# Patient Record
Sex: Male | Born: 1935 | Race: White | Hispanic: No | Smoking: Never smoker
Health system: Southern US, Community
[De-identification: ages and names within clinical notes are randomized; demographics above are authoritative.]

## PROBLEM LIST (undated history)

## (undated) DIAGNOSIS — I495 Sick sinus syndrome: Secondary | ICD-10-CM

## (undated) DIAGNOSIS — Z8546 Personal history of malignant neoplasm of prostate: Secondary | ICD-10-CM

## (undated) DIAGNOSIS — I499 Cardiac arrhythmia, unspecified: Secondary | ICD-10-CM

## (undated) DIAGNOSIS — I509 Heart failure, unspecified: Secondary | ICD-10-CM

## (undated) DIAGNOSIS — I5022 Chronic systolic (congestive) heart failure: Secondary | ICD-10-CM

## (undated) DIAGNOSIS — I4821 Permanent atrial fibrillation: Secondary | ICD-10-CM

## (undated) DIAGNOSIS — I429 Cardiomyopathy, unspecified: Secondary | ICD-10-CM

## (undated) DIAGNOSIS — H269 Unspecified cataract: Secondary | ICD-10-CM

## (undated) DIAGNOSIS — M7660 Achilles tendinitis, unspecified leg: Secondary | ICD-10-CM

## (undated) DIAGNOSIS — B029 Zoster without complications: Secondary | ICD-10-CM

## (undated) DIAGNOSIS — I8390 Asymptomatic varicose veins of unspecified lower extremity: Secondary | ICD-10-CM

## (undated) DIAGNOSIS — H9190 Unspecified hearing loss, unspecified ear: Secondary | ICD-10-CM

## (undated) HISTORY — DX: Asymptomatic varicose veins of unspecified lower extremity: I83.90

## (undated) HISTORY — DX: Sick sinus syndrome: I49.5

## (undated) HISTORY — DX: Chronic systolic (congestive) heart failure: I50.22

## (undated) HISTORY — DX: Heart failure, unspecified: I50.9

## (undated) HISTORY — DX: Permanent atrial fibrillation: I48.21

## (undated) HISTORY — DX: Cardiomyopathy, unspecified: I42.9

## (undated) HISTORY — PX: BREAST SURGERY: SHX581

## (undated) HISTORY — PX: CATARACT EXTRACTION: SUR2

## (undated) HISTORY — DX: Personal history of malignant neoplasm of prostate: Z85.46

## (undated) HISTORY — DX: Zoster without complications: B02.9

## (undated) HISTORY — DX: Cardiac arrhythmia, unspecified: I49.9

## (undated) NOTE — *Deleted (*Deleted)
CH received page from RN to support pt. who has seemed somewhat 'sad' today; when [facility] arrived, pt. lying in bed w/

---

## 1958-12-26 HISTORY — PX: APPENDECTOMY: SHX54

## 2003-08-23 LAB — HM COLONOSCOPY

## 2008-02-22 ENCOUNTER — Encounter: Payer: Self-pay | Admitting: Family Medicine

## 2008-02-25 ENCOUNTER — Encounter: Payer: Self-pay | Admitting: Family Medicine

## 2008-03-26 ENCOUNTER — Encounter: Payer: Self-pay | Admitting: Family Medicine

## 2008-04-26 ENCOUNTER — Encounter: Payer: Self-pay | Admitting: Family Medicine

## 2008-05-27 ENCOUNTER — Encounter: Payer: Self-pay | Admitting: Family Medicine

## 2008-06-24 ENCOUNTER — Encounter: Payer: Self-pay | Admitting: Family Medicine

## 2008-06-24 DIAGNOSIS — D170 Benign lipomatous neoplasm of skin and subcutaneous tissue of head, face and neck: Secondary | ICD-10-CM | POA: Insufficient documentation

## 2008-07-18 ENCOUNTER — Ambulatory Visit: Payer: Self-pay | Admitting: Family Medicine

## 2008-07-21 DIAGNOSIS — B029 Zoster without complications: Secondary | ICD-10-CM

## 2008-07-21 HISTORY — DX: Zoster without complications: B02.9

## 2008-08-07 ENCOUNTER — Ambulatory Visit: Payer: Self-pay | Admitting: Family Medicine

## 2009-07-25 ENCOUNTER — Ambulatory Visit: Payer: Self-pay | Admitting: Ophthalmology

## 2009-07-28 ENCOUNTER — Ambulatory Visit: Payer: Self-pay | Admitting: Ophthalmology

## 2011-04-02 ENCOUNTER — Ambulatory Visit: Payer: Self-pay | Admitting: Family Medicine

## 2013-03-06 LAB — LIPID PANEL
Cholesterol: 231 mg/dL — AB (ref 0–200)
HDL: 78 mg/dL — AB (ref 35–70)
LDL CALC: 133 mg/dL
Triglycerides: 98 mg/dL (ref 40–160)

## 2013-03-06 LAB — CBC AND DIFFERENTIAL
HEMATOCRIT: 40 % — AB (ref 41–53)
Hemoglobin: 13.6 g/dL (ref 13.5–17.5)
PLATELETS: 323 10*3/uL (ref 150–399)
WBC: 6.7 10*3/mL

## 2013-04-27 LAB — TSH: TSH: 5.19 u[IU]/mL (ref 0.41–5.90)

## 2013-04-27 LAB — BASIC METABOLIC PANEL
BUN: 18 mg/dL (ref 4–21)
CREATININE: 0.9 mg/dL (ref 0.6–1.3)
GLUCOSE: 108 mg/dL
Potassium: 5.1 mmol/L (ref 3.4–5.3)
SODIUM: 139 mmol/L (ref 137–147)

## 2013-04-27 LAB — HEPATIC FUNCTION PANEL
ALT: 15 U/L (ref 10–40)
AST: 18 U/L (ref 14–40)

## 2014-08-27 DIAGNOSIS — I8393 Asymptomatic varicose veins of bilateral lower extremities: Secondary | ICD-10-CM | POA: Diagnosis not present

## 2014-09-03 DIAGNOSIS — Z961 Presence of intraocular lens: Secondary | ICD-10-CM | POA: Diagnosis not present

## 2014-10-08 DIAGNOSIS — D649 Anemia, unspecified: Secondary | ICD-10-CM | POA: Insufficient documentation

## 2014-10-08 DIAGNOSIS — N419 Inflammatory disease of prostate, unspecified: Secondary | ICD-10-CM | POA: Insufficient documentation

## 2014-10-08 DIAGNOSIS — I493 Ventricular premature depolarization: Secondary | ICD-10-CM | POA: Insufficient documentation

## 2014-10-08 DIAGNOSIS — I8393 Asymptomatic varicose veins of bilateral lower extremities: Secondary | ICD-10-CM | POA: Insufficient documentation

## 2014-10-08 DIAGNOSIS — R32 Unspecified urinary incontinence: Secondary | ICD-10-CM | POA: Insufficient documentation

## 2014-10-08 DIAGNOSIS — Z8546 Personal history of malignant neoplasm of prostate: Secondary | ICD-10-CM

## 2014-10-08 DIAGNOSIS — I872 Venous insufficiency (chronic) (peripheral): Secondary | ICD-10-CM | POA: Insufficient documentation

## 2014-10-08 HISTORY — DX: Personal history of malignant neoplasm of prostate: Z85.46

## 2014-10-09 ENCOUNTER — Encounter: Payer: Self-pay | Admitting: Family Medicine

## 2014-10-09 ENCOUNTER — Ambulatory Visit (INDEPENDENT_AMBULATORY_CARE_PROVIDER_SITE_OTHER): Payer: Medicare Other | Admitting: Family Medicine

## 2014-10-09 VITALS — BP 92/54 | HR 57 | Temp 97.9°F | Resp 16 | Ht 69.5 in | Wt 200.0 lb

## 2014-10-09 DIAGNOSIS — D649 Anemia, unspecified: Secondary | ICD-10-CM | POA: Diagnosis not present

## 2014-10-09 DIAGNOSIS — R609 Edema, unspecified: Secondary | ICD-10-CM | POA: Insufficient documentation

## 2014-10-09 DIAGNOSIS — Z0000 Encounter for general adult medical examination without abnormal findings: Secondary | ICD-10-CM

## 2014-10-09 NOTE — Progress Notes (Signed)
Patient: Douglas Calhoun, Male    DOB: 08-29-1919, 81 y.o.   MRN: [id number] Visit Date: 10/09/2014  Today's Provider: Lelon Huh, MD   Chief Complaint  Patient presents with  . Medicare Wellness  . Anemia   Subjective:    Annual wellness visit Douglas Calhoun is a 81 y.o. male who presents today for his Subsequent Annual Wellness Visit. He feels well. He reports exercising . He reports he is sleeping poorly.  ----------------------------------------------------------- Patient follow-up for PVC, last seen 04/27/2013. Checked labs/Normal. No changes made. Follow-up for Anemia, last seen 03/06/2013. Checked labs. No changes made. Last EKG:04/27/2013, Last colonoscopy 08/23/2003: Internal hemorrhoids, non-bleeding colonic angiectasias.  Review of Systems  Constitutional: Negative for chills, appetite change and fatigue.  HENT: Positive for hearing loss. Negative for ear pain and sinus pressure.   Eyes: Negative for pain and visual disturbance.  Respiratory: Negative for cough, chest tightness, shortness of breath and wheezing.   Cardiovascular: Negative for chest pain, palpitations and leg swelling.  Gastrointestinal: Negative for nausea, vomiting, abdominal pain, diarrhea, constipation and blood in stool.  Genitourinary: Negative for urgency, frequency, flank pain, decreased urine volume and penile pain.  Musculoskeletal: Positive for neck stiffness. Negative for back pain, joint swelling and neck pain.  Neurological: Negative for dizziness, light-headedness and headaches.  Psychiatric/Behavioral: Positive for sleep disturbance. Negative for confusion and dysphoric mood. The patient is not nervous/anxious.        Some difficulty going to sleep    History   Social History  . Marital Status: Married    Spouse Name: N/A  . Number of Children: N/A  . Years of Education: N/A   Occupational History  . retired    Social History Main Topics  . Smoking status: Never Smoker   . Smokeless  tobacco: Not on file  . Alcohol Use: 0.0 oz/week    0 Standard drinks or equivalent per week     Comment: drinks 2 beers a month  . Drug Use: No  . Sexual Activity: Not on file   Other Topics Concern  . Not on file   Social History Narrative    Patient Active Problem List   Diagnosis Date Noted  . Anemia 10/08/2014  . Personal history of prostate cancer 10/08/2014  . Prostatitis 10/08/2014  . PVC (premature ventricular contraction) 10/08/2014  . Urinary incontinence 10/08/2014  . Varicose veins of legs 10/08/2014  . Venous insufficiency 10/08/2014  . Herpes zoster without complication 74/25/9563  . Lipoma of skin, face 06/24/2008    Past Surgical History  Procedure Laterality Date  . Breast surgery      Breast Biopsy prior to 1960  . Cataract extraction Left   . Appendectomy  1960's    His family history includes Heart disease in his mother; Stroke in his mother.    Previous Medications   ASPIRIN 81 MG TABLET    Take 1 tablet by mouth daily.   CALCIUM 500 MG TABLET    Take 1 tablet by mouth daily.   FERROUS SULFATE (IRON SUPPLEMENT) 325 (65 FE) MG TABLET    Take 1 tablet by mouth daily.   MELOXICAM (MOBIC) 7.5 MG TABLET    Take 1 tablet by mouth daily.   MULTIPLE VITAMINS-MINERALS (MULTIVITAMIN ADULT PO)    Take 1 tablet by mouth daily.   VITAMIN D, CHOLECALCIFEROL, 400 UNITS TABS    Take 1 tablet by mouth daily.   VITAMIN E 400 UNITS TABS    Take 1 tablet by  mouth daily.    Patient Care Team: Birdie Sons, MD as PCP - General (Family Medicine)     Objective:   Vitals: BP 92/54 mmHg  Pulse 57  Temp(Src) 97.9 F (36.6 C) (Oral)  Resp 16  Ht 5' 9.5" (1.765 m)  Wt 200 lb (90.719 kg)  BMI 29.12 kg/m2  SpO2 95%  Physical Exam   General Appearance:    Alert, cooperative, no distress, appears younger than stated age  Head:    Normocephalic, without obvious abnormality, atraumatic  Eyes:    PERRL, conjunctiva/corneas clear, EOM's intact, fundi    benign,  both eyes       Ears:    Normal TM's and external ear canals, both ears, significant hearing impairment  Nose:   Nares normal, septum midline, mucosa normal, no drainage   or sinus tenderness  Throat:   Lips, mucosa, and tongue normal; teeth and gums normal  Neck:   Supple, symmetrical, trachea midline, no adenopathy;       thyroid:  No enlargement/tenderness/nodules; no carotid   bruit or JVD  Back:     Symmetric, no curvature, ROM normal, no CVA tenderness  Lungs:     Clear to auscultation bilaterally, respirations unlabored  Chest wall:    No tenderness or deformity  Heart:    Regular rate and rhythm, S1 and S2 normal, III/VI systolic murmur, rub   or gallop  Abdomen:     Soft, non-tender, bowel sounds active all four quadrants,    no masses, no organomegaly  Genitalia:    deferred  Rectal:    deferred  Extremities:   Extremities normal, atraumatic, no cyanosis or edema  Pulses:   2+ and symmetric all extremities  Skin:   Skin color, texture, turgor normal, no rashes or lesions  Lymph nodes:   Cervical, supraclavicular, and axillary nodes normal  Neurologic:   CNII-XII intact. Normal strength, sensation and reflexes      throughout    Fall Risk Assessment Fall Risk  10/09/2014  Falls in the past year? Yes  Number falls in past yr: 1  Injury with Fall? Yes     Depression Screen PHQ 2/9 Scores 10/09/2014  PHQ - 2 Score 0    Cognitive Testing - 6-CIT  Correct? Score   What year is it? yes 0 0 or 4  What month is it? yes 0 0 or 3  Memorize:    Pia Mau,  44,  [address]  [place],      What time is it? (within 1 hour) yes 0 0 or 3  Count backwards from 20 yes 0 0, 2, or 4  Name the months of the year yes 0 0, 2, or 4  Repeat name & address above yes 0 0, 2, 4, 6, 8, or 10       TOTAL SCORE  0/28   Interpretation:  Normal  Normal (0-7) Abnormal (8-28)       Assessment & Plan:     Annual Wellness Visit  Reviewed patient's Family Medical History Reviewed and  updated list of patient's medical providers Assessment of cognitive impairment was done Assessed patient's functional ability Established a written schedule for health screening [place] Completed and Reviewed  Exercise Activities and Dietary recommendations Goals    None      Immunization History  Administered Date(s) Administered  . Pneumococcal Conjugate-13 03/06/2013  . Tdap 02/29/2012  . Zoster 03/26/2010    Health Maintenance  Topic Date Due  . INFLUENZA VACCINE  11/25/2014  . TETANUS/TDAP  02/28/2022  . ZOSTAVAX  Completed      Discussed health benefits of physical activity, and encouraged him to engage in regular exercise appropriate for his age and condition.    ------------------------------------------------------------------------------------------------------------

## 2014-10-10 ENCOUNTER — Encounter: Payer: Self-pay | Admitting: Family Medicine

## 2014-10-10 LAB — RENAL FUNCTION PANEL
Albumin: 4.2 g/dL (ref 3.2–4.6)
BUN / CREAT RATIO: 18 (ref 10–22)
BUN: 17 mg/dL (ref 10–36)
CO2: 22 mmol/L (ref 18–29)
Calcium: 9.4 mg/dL (ref 8.6–10.2)
Chloride: 100 mmol/L (ref 97–108)
Creatinine, Ser: 0.93 mg/dL (ref 0.76–1.27)
GFR calc Af Amer: 82 mL/min/{1.73_m2} (ref >59)
GFR calc non Af Amer: 71 mL/min/{1.73_m2} (ref >59)
Glucose: 104 mg/dL — ABNORMAL HIGH (ref 65–99)
Phosphorus: 3.7 mg/dL (ref 2.5–4.5)
Potassium: 4.9 mmol/L (ref 3.5–5.2)
Sodium: 136 mmol/L (ref 134–144)

## 2014-10-10 LAB — CBC
Hematocrit: 37.1 % — ABNORMAL LOW (ref 37.5–51.0)
Hemoglobin: 12.7 g/dL (ref 12.6–17.7)
MCH: 33.5 pg — AB (ref 26.6–33.0)
MCHC: 34.2 g/dL (ref 31.5–35.7)
MCV: 98 fL — ABNORMAL HIGH (ref 79–97)
Platelets: 279 10*3/uL (ref 150–379)
RBC: 3.79 x10E6/uL — AB (ref 4.14–5.80)
RDW: 13.8 % (ref 12.3–15.4)
WBC: 7.4 10*3/uL (ref 3.4–10.8)

## 2014-10-10 LAB — T4 AND TSH
T4 TOTAL: 5.5 ug/dL (ref 4.5–12.0)
TSH: 2.86 u[IU]/mL (ref 0.450–4.500)

## 2014-11-19 ENCOUNTER — Emergency Department (HOSPITAL_COMMUNITY): Payer: Medicare Other

## 2014-11-19 ENCOUNTER — Emergency Department (HOSPITAL_COMMUNITY)
Admission: EM | Admit: 2014-11-19 | Discharge: 2014-11-19 | Disposition: A | Discharge status: Home / Self Care | Payer: Medicare Other | Attending: Emergency Medicine | Admitting: Emergency Medicine

## 2014-11-19 ENCOUNTER — Encounter (HOSPITAL_COMMUNITY): Payer: Self-pay

## 2014-11-19 DIAGNOSIS — Z8619 Personal history of other infectious and parasitic diseases: Secondary | ICD-10-CM | POA: Insufficient documentation

## 2014-11-19 DIAGNOSIS — Z8546 Personal history of malignant neoplasm of prostate: Secondary | ICD-10-CM | POA: Insufficient documentation

## 2014-11-19 DIAGNOSIS — Z7982 Long term (current) use of aspirin: Secondary | ICD-10-CM | POA: Diagnosis not present

## 2014-11-19 DIAGNOSIS — H9190 Unspecified hearing loss, unspecified ear: Secondary | ICD-10-CM | POA: Insufficient documentation

## 2014-11-19 DIAGNOSIS — Z79899 Other long term (current) drug therapy: Secondary | ICD-10-CM | POA: Insufficient documentation

## 2014-11-19 DIAGNOSIS — Z8739 Personal history of other diseases of the musculoskeletal system and connective tissue: Secondary | ICD-10-CM | POA: Insufficient documentation

## 2014-11-19 DIAGNOSIS — R0789 Other chest pain: Secondary | ICD-10-CM | POA: Diagnosis not present

## 2014-11-19 DIAGNOSIS — R079 Chest pain, unspecified: Secondary | ICD-10-CM | POA: Diagnosis present

## 2014-11-19 HISTORY — DX: Achilles tendinitis, unspecified leg: M76.60

## 2014-11-19 HISTORY — DX: Unspecified cataract: H26.9

## 2014-11-19 HISTORY — DX: Unspecified hearing loss, unspecified ear: H91.90

## 2014-11-19 LAB — CBC
HEMATOCRIT: 37.2 % — AB (ref 39.0–52.0)
Hemoglobin: 12.6 g/dL — ABNORMAL LOW (ref 13.0–17.0)
MCH: 34.1 pg — AB (ref 26.0–34.0)
MCHC: 33.9 g/dL (ref 30.0–36.0)
MCV: 100.5 fL — AB (ref 78.0–100.0)
Platelets: 223 10*3/uL (ref 150–400)
RBC: 3.7 MIL/uL — ABNORMAL LOW (ref 4.22–5.81)
RDW: 13 % (ref 11.5–15.5)
WBC: 7.3 10*3/uL (ref 4.0–10.5)

## 2014-11-19 LAB — I-STAT TROPONIN, ED: Troponin i, poc: 0 ng/mL (ref 0.00–0.08)

## 2014-11-19 LAB — COMPREHENSIVE METABOLIC PANEL
ALT: 17 U/L (ref 17–63)
AST: 30 U/L (ref 15–41)
Albumin: 3.5 g/dL (ref 3.5–5.0)
Alkaline Phosphatase: 52 U/L (ref 38–126)
Anion gap: 6 (ref 5–15)
BUN: 15 mg/dL (ref 6–20)
CO2: 24 mmol/L (ref 22–32)
Calcium: 8.9 mg/dL (ref 8.9–10.3)
Chloride: 107 mmol/L (ref 101–111)
Creatinine, Ser: 0.81 mg/dL (ref 0.61–1.24)
GFR calc Af Amer: >60 mL/min (ref >60)
GLUCOSE: 112 mg/dL — AB (ref 65–99)
POTASSIUM: 4.1 mmol/L (ref 3.5–5.1)
Sodium: 137 mmol/L (ref 135–145)
Total Bilirubin: 0.7 mg/dL (ref 0.3–1.2)
Total Protein: 6 g/dL — ABNORMAL LOW (ref 6.5–8.1)

## 2014-11-19 LAB — TROPONIN I: Troponin I: <0.03 ng/mL (ref <0.031)

## 2014-11-19 NOTE — ED Provider Notes (Signed)
CSN: [id number]     Arrival date & time 11/19/14  43 History   First MD Initiated Contact with Patient 11/19/14 1557     Chief Complaint  Patient presents with  . Chest Pain    HPI  Patient presents with concern of chest pain. Patient was at a regular checkup, when he developed sternal chest pressure. There is no radiation, no associated symptoms, including no dyspnea, lightheadedness, syncope, nausea, vomiting. Patient was well prior to the onset of symptoms. Symptoms resolved with nitroglycerin, aspirin. Patient also had Nitropaste applied by EMS in route. Here patient has no complaint, states that he is in his usual state of health.   Past Medical History  Diagnosis Date  . Herpes zoster without complication 08/12/6220  . Personal history of prostate cancer 10/08/2014    s/p 15 radiation treatments treated by Dr. Madelin Headings   . Hearing loss   . Cataract   . Achilles tendinitis    Past Surgical History  Procedure Laterality Date  . Breast surgery      Breast Biopsy prior to 1960  . Cataract extraction Left   . Appendectomy  1960's   Family History  Problem Relation Age of Onset  . Heart disease Mother   . Stroke Mother    History  Substance Use Topics  . Smoking status: Never Smoker   . Smokeless tobacco: Not on file  . Alcohol Use: 0.0 oz/week    0 Standard drinks or equivalent per week     Comment: drinks 2 beers a month    Review of Systems  Constitutional:       Per HPI, otherwise negative  HENT:       Per HPI, otherwise negative  Respiratory:       Per HPI, otherwise negative  Cardiovascular:       Per HPI, otherwise negative  Gastrointestinal: Negative for vomiting.  Endocrine:       Negative aside from HPI  Genitourinary:       Neg aside from HPI   Musculoskeletal:       Per HPI, otherwise negative  Skin: Negative.   Neurological: Negative for syncope.      Allergies  Review of patient's allergies indicates no known allergies.  Home  Medications   Prior to Admission medications   Medication Sig Start Date End Date Taking? Authorizing Provider  alendronate (FOSAMAX) 70 MG tablet Take 70 mg by mouth once a week. Take with a full glass of water on an empty stomach on Saturdays.   Yes Historical Provider, MD  Ascorbic Acid (VITAMIN C PO) Take 1 tablet by mouth daily.   Yes Historical Provider, MD  aspirin 81 MG tablet Take 1 tablet by mouth daily.   Yes Historical Provider, MD  Calcium 500 MG tablet Take 1 tablet by mouth daily. 07/17/08  Yes Historical Provider, MD  docusate sodium (COLACE) 100 MG capsule Take 100 mg by mouth daily.   Yes Historical Provider, MD  ferrous sulfate (IRON SUPPLEMENT) 325 (65 FE) MG tablet Take 1 tablet by mouth daily.   Yes Historical Provider, MD  Multiple Vitamins-Minerals (MULTIVITAMIN ADULT PO) Take 1 tablet by mouth daily. 07/17/08  Yes Historical Provider, MD  psyllium (METAMUCIL SMOOTH TEXTURE) 28 % packet Take 1 packet by mouth daily.   Yes Historical Provider, MD  tamsulosin (FLOMAX) 0.4 MG CAPS capsule Take 0.4 mg by mouth daily after supper.   Yes Historical Provider, MD  Vitamin D, Cholecalciferol, 400 UNITS TABS Take 1  tablet by mouth daily. 07/17/08  Yes Historical Provider, MD  Vitamin E 400 UNITS TABS Take 1 tablet by mouth daily. 07/17/08  Yes Historical Provider, MD   BP 117/53 mmHg  Pulse 59  Temp(Src) 98.9 F (37.2 C) (Oral)  Resp 18  SpO2 98% Physical Exam  Constitutional: He is oriented to person, place, and time. He appears well-developed. No distress.  HENT:  Head: Normocephalic and atraumatic.  Eyes: Conjunctivae and EOM are normal.  Cardiovascular: Normal rate and regular rhythm.   Pulmonary/Chest: Effort normal. No stridor. No respiratory distress.  L upper chest nitro paste in place  Abdominal: He exhibits no distension. There is no tenderness.  Musculoskeletal: He exhibits no edema.  Neurological: He is alert and oriented to person, place, and time.  Skin: Skin  is warm and dry.  Psychiatric: He has a normal mood and affect.  Nursing note and vitals reviewed.   ED Course  Procedures (including critical care time) Labs Review Labs Reviewed  CBC  COMPREHENSIVE METABOLIC PANEL  TROPONIN I    Imaging Review No results found.   EKG Interpretation   Date/Time:  Tuesday November 19 2014 15:58:34 EDT Ventricular Rate:  60 PR Interval:  147 QRS Duration: 109 QT Interval:  448 QTC Calculation: 448 R Axis:   -13 Text Interpretation:  Sinus rhythm Ventricular trigeminy Low voltage,  extremity and precordial leads Sinus rhythm Sinus rhythm Premature  ventricular complexes Artifact Abnormal ekg Confirmed by Carmin Muskrat   MD ([id number]) on 11/19/2014 4:21:05 PM     Patient's BP is 95/45. Nitro paste removed.   O2- 99%ra  cardiac: 65sr, nml   After the initial troponin had normal value, I reassessed the patient. He had no ongoing complaints. Vital signs are stable.  After second one also resulted normal, reevaluation demonstrated similar reassuring physical exam findings, vital sign instability, no complaint. Patient has a physician about the scheduled for tomorrow, will proceed to that appointment. MDM   Final diagnoses:  Atypical chest pain  elderly male with no history of coronary disease presents after an episode of sternal chest pain. In this facility the patient had no pain, no evidence for arrhythmia, labs were unremarkable, with 2 negative troponins, nonischemic EKG: There is low suspicion for ongoing coronary ischemia. Symptoms maybe secondary to gastroesophageal etiology versus vasospasm versus occult infection, though this seems less likely. No evidence for respiratory dysfunction, including no cough, dyspnea, hypoxia, tachypnea. We discussed patient's x-ray findings, and he agreed, no evidence for pneumonia. Patient will follow-up with his physician tomorrow for further evaluation, management.  Carmin Muskrat, MD 11/19/14  [phone]

## 2014-11-19 NOTE — ED Notes (Signed)
Pt. Presents to ED from [facility] center. Pt. Receiving hearing aid checkup when he had sudden onset of substernal CP, denies radiation, denies associated symptoms. No EKG changes noted by EMS. [facility] administered 1 Nitro and 324 ASA which brought pain from 5/10 to 2/10. EMS administered additional 3 Nitro and 1 inch Nitro paste. Pt. Denies pain at present.

## 2014-11-19 NOTE — Discharge Instructions (Signed)
As discussed, it is important that you follow up as soon as possible with your physician for continued management of your condition. ° °If you develop any new, or concerning changes in your condition, please return to the emergency department immediately. ° °

## 2014-11-19 NOTE — ED Notes (Signed)
This RN spoke with lab, who states that the pt's troponin will be done in approx 10-15 minutes.

## 2015-01-27 ENCOUNTER — Other Ambulatory Visit: Payer: Self-pay | Admitting: Family Medicine

## 2015-01-27 DIAGNOSIS — R079 Chest pain, unspecified: Secondary | ICD-10-CM

## 2015-01-30 ENCOUNTER — Ambulatory Visit (INDEPENDENT_AMBULATORY_CARE_PROVIDER_SITE_OTHER): Payer: Medicare Other

## 2015-01-30 ENCOUNTER — Other Ambulatory Visit: Payer: Self-pay

## 2015-01-30 DIAGNOSIS — R079 Chest pain, unspecified: Secondary | ICD-10-CM | POA: Diagnosis not present

## 2015-04-07 ENCOUNTER — Ambulatory Visit (INDEPENDENT_AMBULATORY_CARE_PROVIDER_SITE_OTHER): Payer: Medicare Other | Admitting: Family Medicine

## 2015-04-07 ENCOUNTER — Ambulatory Visit
Admission: RE | Admit: 2015-04-07 | Discharge: 2015-04-07 | Disposition: A | Discharge status: Home / Self Care | Payer: Medicare Other | Source: Ambulatory Visit | Attending: Family Medicine | Admitting: Family Medicine

## 2015-04-07 ENCOUNTER — Encounter: Payer: Self-pay | Admitting: Family Medicine

## 2015-04-07 VITALS — BP 120/52 | HR 66 | Temp 98.4°F | Resp 16 | Wt 212.0 lb

## 2015-04-07 DIAGNOSIS — J40 Bronchitis, not specified as acute or chronic: Secondary | ICD-10-CM

## 2015-04-07 DIAGNOSIS — J9811 Atelectasis: Secondary | ICD-10-CM | POA: Insufficient documentation

## 2015-04-07 MED ORDER — CEFDINIR 300 MG PO CAPS: 300.0000 mg | ORAL_CAPSULE | Freq: Two times a day (BID) | ORAL | Status: DC

## 2015-04-07 MED ORDER — HYDROCODONE-HOMATROPINE 5-1.5 MG/5ML PO SYRP: ORAL_SOLUTION | ORAL | Status: DC

## 2015-04-07 NOTE — Progress Notes (Signed)
Subjective:     Patient ID: Douglas Calhoun, male   DOB: 05-24-1919, 81 y.o.   MRN: [id number]  HPI  Chief Complaint  Patient presents with  . Cough    Patient comes in office today with concerns of cough and congestion for the past 3-4 days. Patient states that his cough is productive of phelgm and he has had slight blood come up when he coughs. Patient complains of shortness of breath but denies any chest pain/pressure, patient has not taken any otc medication.   Reports low grade fever at home. Deep non-productive cough during his office visit.   Review of Systems     Objective:   Physical Exam  Constitutional: He appears well-developed and well-nourished. No distress.  Ears: T.M's intact without inflammation Throat: no tonsillar enlargement or exudate Neck: no cervical adenopathy Lungs: clear     Assessment:    1. Bronchitis; will evaluate for pneumonia due to low saturation. - cefdinir (OMNICEF) 300 MG capsule; Take 1 capsule (300 mg total) by mouth 2 (two) times daily.  Dispense: 20 capsule; Refill: 0 - DG Chest 2 View; Future - HYDROcodone-homatropine (HYCODAN) 5-1.5 MG/5ML syrup; 5 ml 6-8 hours as needed for cough  Dispense: 240 mL; Refill: 0    Plan:    Further f/u pending x-ray report.

## 2015-04-07 NOTE — Patient Instructions (Signed)
Discussed use of Mucinex and Delsym 

## 2015-04-08 ENCOUNTER — Telehealth: Payer: Self-pay

## 2015-04-08 NOTE — Telephone Encounter (Signed)
-----   Message from Carmon Ginsberg, [place] sent at 04/07/2015  5:11 PM EST ----- No pneumonia

## 2015-04-08 NOTE — Telephone Encounter (Signed)
Patient was advised of lab report. KW 

## 2015-04-15 ENCOUNTER — Other Ambulatory Visit: Payer: Self-pay | Admitting: Family Medicine

## 2015-04-15 NOTE — Telephone Encounter (Signed)
Please advise. Thanks.  

## 2015-04-15 NOTE — Telephone Encounter (Signed)
Pt states the cough medication sure has helped  him to rest at night, Pt is still coughing, pt would like to know if he could get a refill on the  HYDROcodone-homatropine (HYCODAN) 5-1.5 MG/5ML syrup.Pt use's  [facility] on [address].  CB# [phone] Thanks CC

## 2015-04-16 ENCOUNTER — Other Ambulatory Visit: Payer: Self-pay | Admitting: Family Medicine

## 2015-04-16 DIAGNOSIS — J40 Bronchitis, not specified as acute or chronic: Secondary | ICD-10-CM

## 2015-04-16 MED ORDER — HYDROCODONE-HOMATROPINE 5-1.5 MG/5ML PO SYRP: ORAL_SOLUTION | ORAL | Status: DC

## 2015-04-16 NOTE — Telephone Encounter (Signed)
[id number] 

## 2015-04-16 NOTE — Telephone Encounter (Signed)
Prescription is available for pickup. If cough not improving would want him to f/u with his primary doctor.

## 2015-04-17 NOTE — Telephone Encounter (Signed)
Patient has been advised. KW 

## 2015-10-24 ENCOUNTER — Emergency Department
Admission: EM | Admit: 2015-10-24 | Discharge: 2015-10-24 | Disposition: A | Discharge status: Home / Self Care | Payer: Medicare Other | Attending: Emergency Medicine | Admitting: Emergency Medicine

## 2015-10-24 ENCOUNTER — Encounter: Payer: Self-pay | Admitting: Emergency Medicine

## 2015-10-24 DIAGNOSIS — Z7982 Long term (current) use of aspirin: Secondary | ICD-10-CM | POA: Insufficient documentation

## 2015-10-24 DIAGNOSIS — Z79899 Other long term (current) drug therapy: Secondary | ICD-10-CM | POA: Diagnosis not present

## 2015-10-24 DIAGNOSIS — R338 Other retention of urine: Secondary | ICD-10-CM

## 2015-10-24 DIAGNOSIS — R339 Retention of urine, unspecified: Secondary | ICD-10-CM | POA: Diagnosis present

## 2015-10-24 LAB — CBC
HCT: 36.7 % — ABNORMAL LOW (ref 40.0–52.0)
HEMOGLOBIN: 12.9 g/dL — AB (ref 13.0–18.0)
MCH: 34.2 pg — AB (ref 26.0–34.0)
MCHC: 35.2 g/dL (ref 32.0–36.0)
MCV: 97.3 fL (ref 80.0–100.0)
Platelets: 240 10*3/uL (ref 150–440)
RBC: 3.77 MIL/uL — ABNORMAL LOW (ref 4.40–5.90)
RDW: 12.7 % (ref 11.5–14.5)
WBC: 9.9 10*3/uL (ref 3.8–10.6)

## 2015-10-24 LAB — BASIC METABOLIC PANEL
Anion gap: 7 (ref 5–15)
BUN: 17 mg/dL (ref 6–20)
CHLORIDE: 102 mmol/L (ref 101–111)
CO2: 25 mmol/L (ref 22–32)
Calcium: 8.9 mg/dL (ref 8.9–10.3)
Creatinine, Ser: 0.73 mg/dL (ref 0.61–1.24)
GFR calc Af Amer: >60 mL/min (ref >60)
GLUCOSE: 121 mg/dL — AB (ref 65–99)
POTASSIUM: 4.1 mmol/L (ref 3.5–5.1)
Sodium: 134 mmol/L — ABNORMAL LOW (ref 135–145)

## 2015-10-24 LAB — URINALYSIS COMPLETE WITH MICROSCOPIC (ARMC ONLY)
BILIRUBIN URINE: NEGATIVE
Bacteria, UA: NONE SEEN
GLUCOSE, UA: NEGATIVE mg/dL
Hgb urine dipstick: NEGATIVE
Ketones, ur: NEGATIVE mg/dL
Leukocytes, UA: NEGATIVE
Nitrite: NEGATIVE
Protein, ur: NEGATIVE mg/dL
SPECIFIC GRAVITY, URINE: 1.008 (ref 1.005–1.030)
pH: 7 (ref 5.0–8.0)

## 2015-10-24 MED ORDER — LIDOCAINE HCL 2 % EX GEL
1.0000 "application " | Freq: Once | CUTANEOUS | Status: AC | PRN
  Administered 2015-10-24: 1 via URETHRAL
  Filled 2015-10-24: qty 5

## 2015-10-24 NOTE — ED Notes (Addendum)
Bladder Scan >59ml x3

## 2015-10-24 NOTE — ED Notes (Signed)
Reports unable to void since last pm.

## 2015-10-24 NOTE — ED Notes (Signed)
Education on catheter care given. Patient aware of the need for urgent follow up care.

## 2015-10-24 NOTE — Discharge Instructions (Signed)
Acute Urinary Retention, Male °Acute urinary retention is the temporary inability to urinate. °This is a common problem in older men. As men age their prostates become larger and block the flow of urine from the bladder. This is usually a problem that has come on gradually.  °HOME CARE INSTRUCTIONS °If you are sent home with a Foley catheter and a drainage system, you will need to discuss the best course of action with your health care provider. While the catheter is in, maintain a good intake of fluids. Keep the drainage bag emptied and lower than your catheter. This is so that contaminated urine will not flow back into your bladder, which could lead to a urinary tract infection. °There are two main types of drainage bags. One is a large bag that usually is used at night. It has a good capacity that will allow you to sleep through the night without having to empty it. The second type is called a leg bag. It has a smaller capacity, so it needs to be emptied more frequently. However, the main advantage is that it can be attached by a leg strap and can go underneath your clothing, allowing you the freedom to move about or leave your home. °Only take over-the-counter or prescription medicines for pain, discomfort, or fever as directed by your health care provider.  °SEEK MEDICAL CARE IF: °· You develop a low-grade fever. °· You experience spasms or leakage of urine with the spasms. °SEEK IMMEDIATE MEDICAL CARE IF:  °· You develop chills or fever. °· Your catheter stops draining urine. °· Your catheter falls out. °· You start to develop increased bleeding that does not respond to rest and increased fluid intake. °MAKE SURE YOU: °· Understand these instructions. °· Will watch your condition. °· Will get help right away if you are not doing well or get worse. °  °This information is not intended to replace advice given to you by your health care provider. Make sure you discuss any questions you have with your health care  provider. °  °Document Released: 07/19/2000 Document Revised: 08/27/2014 Document Reviewed: 09/21/2012 °Elsevier Interactive Patient Education ©2016 [employer] ° °

## 2015-10-24 NOTE — ED Provider Notes (Signed)
[facility] Emergency Department Provider Note  ____________________________________________    I have reviewed the triage vital signs and the nursing notes.   HISTORY  Chief Complaint Urinary Retention    HPI Douglas Calhoun is a 82 y.o. male who presents with urinary retention. Patient reports he is not urinated all night. He reports he focuses on taking lots of water during the day and usually has to urinate several times at night but he did not do so at all last night. He is unable to urinate, he complains of mild discomfort in his lower abdomen. No fevers or chills. No history of urinary tract infection. He reports he has a weak stream at baseline. He did have radiation to his prostate 20 years ago     Past Medical History  Diagnosis Date  . Herpes zoster without complication [id number]  . Personal history of prostate cancer 10/08/2014    s/p 15 radiation treatments treated by Dr. Madelin Headings   . Hearing loss   . Cataract   . Achilles tendinitis     Patient Active Problem List   Diagnosis Date Noted  . Edema 10/09/2014  . Anemia 10/08/2014  . PVC (premature ventricular contraction) 10/08/2014  . Urinary incontinence 10/08/2014  . Varicose veins of legs 10/08/2014  . Venous insufficiency 10/08/2014  . Lipoma of skin, face 06/24/2008    Past Surgical History  Procedure Laterality Date  . Breast surgery      Breast Biopsy prior to 1960  . Cataract extraction Left   . Appendectomy  1960's    Current Outpatient Rx  Name  Route  Sig  Dispense  Refill  . alendronate (FOSAMAX) 70 MG tablet   Oral   Take 70 mg by mouth once a week. Take with a full glass of water on an empty stomach on Saturdays.         Marland Kitchen apixaban (ELIQUIS) 5 MG TABS tablet   Oral   Take 5 mg by mouth 2 (two) times daily.         . Ascorbic Acid (VITAMIN C PO)   Oral   Take 1 tablet by mouth daily.         Marland Kitchen aspirin 81 MG tablet   Oral   Take 1 tablet by mouth  daily.         Marland Kitchen atorvastatin (LIPITOR) 20 MG tablet   Oral   Take 20 mg by mouth daily. 1/2 a pill at bedtime         . Calcium 500 MG tablet   Oral   Take 1 tablet by mouth daily.         . cefdinir (OMNICEF) 300 MG capsule   Oral   Take 1 capsule (300 mg total) by mouth 2 (two) times daily.   20 capsule   0   . docusate sodium (COLACE) 100 MG capsule   Oral   Take 100 mg by mouth daily.         . ferrous sulfate (IRON SUPPLEMENT) 325 (65 FE) MG tablet   Oral   Take 1 tablet by mouth daily.         Marland Kitchen HYDROcodone-homatropine (HYCODAN) 5-1.5 MG/5ML syrup      5 ml 6-8 hours as needed for cough   240 mL   0   . MELATONIN PO   Oral   Take by mouth at bedtime as needed.         . Multiple Vitamins-Minerals (MULTIVITAMIN  ADULT PO)   Oral   Take 1 tablet by mouth daily.         . psyllium (METAMUCIL SMOOTH TEXTURE) 28 % packet   Oral   Take 1 packet by mouth daily.         . tamsulosin (FLOMAX) 0.4 MG CAPS capsule   Oral   Take 0.4 mg by mouth daily after supper.         . Vitamin D, Cholecalciferol, 400 UNITS TABS   Oral   Take 1 tablet by mouth daily.         . Vitamin E 400 UNITS TABS   Oral   Take 1 tablet by mouth daily.           Allergies Review of patient's allergies indicates no known allergies.  Family History  Problem Relation Age of Onset  . Heart disease Mother   . Stroke Mother     Social History Social History  Substance Use Topics  . Smoking status: Never Smoker   . Smokeless tobacco: None  . Alcohol Use: 0.0 oz/week    0 Standard drinks or equivalent per week     Comment: drinks 2 beers a month    Review of Systems  Constitutional: Negative for fever. Eyes: Negative for redness ENT: Negative for sore throat Cardiovascular: Negative for chest pain Respiratory: Negative for Cough Gastrointestinal: As above, no constipation Genitourinary: Urinary retention as above Musculoskeletal: Negative for  Bilateral back pain Skin: Negative for rash. Neurological: Negative for headache or focal weakness Psychiatric: no anxiety    ____________________________________________   PHYSICAL EXAM:  VITAL SIGNS: ED Triage Vitals  Enc Vitals Group     BP 10/24/15 0840 128/55 mmHg     Pulse Rate 10/24/15 0840 82     Resp 10/24/15 0840 18     Temp 10/24/15 0840 97.5 F (36.4 C)     Temp Source 10/24/15 0840 Oral     SpO2 10/24/15 0840 97 %     Weight 10/24/15 0840 200 lb (90.719 kg)     Height 10/24/15 0840 5\' 11"  (1.803 m)     Head Cir --      Peak Flow --      Pain Score 10/24/15 0841 0     Pain Loc --      Pain Edu? --      Excl. in [place]? --      Constitutional: Alert and oriented. Well appearing and in no distress.  Eyes: Conjunctivae are normal. No erythema or injection ENT   Head: Normocephalic and atraumatic.   Mouth/Throat: Mucous membranes are moist. Cardiovascular: Normal rate, regular rhythm. Normal and symmetric distal pulses are present in the upper extremities. No murmurs or rubs  Respiratory: Normal respiratory effort without tachypnea nor retractions. Breath sounds are clear and equal bilaterally.  Gastrointestinal: Soft and non-tender in all quadrants. No distention. There is no CVA tenderness. Genitourinary: deferred Musculoskeletal: Nontender with normal range of motion in all extremities. No lower extremity tenderness nor edema. Neurologic:  Normal speech and language. No gross focal neurologic deficits are appreciated. Skin:  Skin is warm, dry and intact. No rash noted. Psychiatric: Mood and affect are normal. Patient exhibits appropriate insight and judgment.  ____________________________________________    LABS (pertinent positives/negatives)  Labs Reviewed  URINALYSIS COMPLETEWITH MICROSCOPIC ([place])    ____________________________________________   EKG  None  ____________________________________________    RADIOLOGY  Bladder  scan shows greater than 500 cc  ____________________________________________   PROCEDURES  Procedure(s) performed: none  Critical Care performed:none  ____________________________________________   INITIAL IMPRESSION / ASSESSMENT AND PLAN / ED COURSE  Pertinent labs & imaging results that were available during my care of the patient were reviewed by me and considered in my medical decision making (see chart for details).  Patient with urinary retention. We will check a CMP, insert Foley catheter, send urinalysis and reevaluate  ----------------------------------------- 10:29 AM on 10/24/2015 -----------------------------------------  Foley placed without difficulty, greater than 500 cc of yellow urine. Patient is completely pain free and feels well. Labs/ua unremarkable. We will give leg bag and have the patient follow up closely with urology. The patient agrees with this plan.  ____________________________________________   FINAL CLINICAL IMPRESSION(S) / ED DIAGNOSES  Final diagnoses:  Acute urinary retention          Lavonia Drafts, MD 10/24/15 1031

## 2015-10-25 LAB — URINE CULTURE: Culture: NO GROWTH

## 2015-11-03 ENCOUNTER — Encounter: Payer: Self-pay | Admitting: Urology

## 2015-11-03 ENCOUNTER — Ambulatory Visit (INDEPENDENT_AMBULATORY_CARE_PROVIDER_SITE_OTHER): Payer: Medicare Other | Admitting: Urology

## 2015-11-03 VITALS — BP 109/78 | HR 70 | Ht 71.0 in | Wt 201.0 lb

## 2015-11-03 DIAGNOSIS — R338 Other retention of urine: Secondary | ICD-10-CM | POA: Diagnosis not present

## 2015-11-03 NOTE — Progress Notes (Signed)
Fill and Pull Catheter Removal  Patient is present today for a catheter removal.  Patient was cleaned and prepped in a sterile fashion 344ml of sterile water/ saline was instilled into the bladder when the patient felt the urge to urinate. 52ml of water was then drained from the balloon.  A 16FR foley cath was removed from the bladder no complications were noted .  Patient as then given some time to void on their own.  Patient can void  331ml on their own after some time.  Patient tolerated well.  Preformed by: Fonnie Jarvis, CMA  Follow up/ Additional notes: tomorrow morning for a PVR

## 2015-11-03 NOTE — Progress Notes (Signed)
11/03/2015 4:48 PM   Douglas Calhoun 1919/09/28 [id number]  Referring provider: Birdie Sons, MD [address] [place], [place] [address]  Chief Complaint  Patient presents with  . Urinary Retention    New Patient    HPI: 82 year old male is seen today in follow-up for acute urinary retention. The patient states that  he tries to drink a lot of water during the day and typically urinates 2-3 times at night. However, on the night prior to presenting to the emergency room he did not urinate. He did note some abdominal or suprapubic pressure but no significant pain. The patient does not have a history of urinary retention. He does take tamsulosin. He also takes oxybutynin. The patient has no history of recurrent urinary tract infections.  The patient's has a history of prostate cancer and was treated with external beam radiation approximately 20 years ago. His last PSA was possibly 5 years ago which he says is undetectable.   PMH: Past Medical History  Diagnosis Date  . Herpes zoster without complication [id number]  . Personal history of prostate cancer 10/08/2014    s/p 15 radiation treatments treated by Dr. Madelin Headings   . Hearing loss   . Cataract   . Achilles tendinitis     Surgical History: Past Surgical History  Procedure Laterality Date  . Breast surgery      Breast Biopsy prior to 1960  . Cataract extraction Left   . Appendectomy  1960's    Home Medications:    Medication List       This list is accurate as of: 11/03/15  4:48 PM.  Always use your most recent med list.               alendronate 70 MG tablet  Commonly known as:  FOSAMAX  Take 70 mg by mouth once a week. Take with a full glass of water on an empty stomach on Saturdays.     apixaban 5 MG Tabs tablet  Commonly known as:  ELIQUIS  Take 5 mg by mouth 2 (two) times daily.     aspirin 81 MG tablet  Take 1 tablet by mouth daily.     atorvastatin 20 MG tablet  Commonly known as:   LIPITOR  Take 20 mg by mouth daily. 1/2 a pill at bedtime     Calcium 500 MG tablet  Take 1 tablet by mouth daily.     MULTIVITAMIN ADULT PO  Take 1 tablet by mouth daily.     tamsulosin 0.4 MG Caps capsule  Commonly known as:  FLOMAX  Take 0.4 mg by mouth daily after supper.     Vitamin D (Cholecalciferol) 400 units Tabs  Take 1 tablet by mouth daily.     Vitamin E 400 units Tabs  Take 1 tablet by mouth daily.        Allergies: No Known Allergies  Family History: Family History  Problem Relation Age of Onset  . Heart disease Mother   . Stroke Mother     Social History:  reports that he has never smoked. He does not have any smokeless tobacco history on file. He reports that he drinks alcohol. He reports that he does not use illicit drugs.  ROS: UROLOGY Frequent Urination?: No Hard to postpone urination?: Yes Burning/pain with urination?: No Get up at night to urinate?: Yes Leakage of urine?: No Urine stream starts and stops?: No Trouble starting stream?: No Do you have to strain to  urinate?: No Blood in urine?: No Urinary tract infection?: No Sexually transmitted disease?: No Injury to kidneys or bladder?: No Painful intercourse?: No Weak stream?: No Erection problems?: No Penile pain?: No  Gastrointestinal Nausea?: No Vomiting?: No Indigestion/heartburn?: No Diarrhea?: No Constipation?: No  Constitutional Fever: No Night sweats?: No Weight loss?: No Fatigue?: No  Skin Skin rash/lesions?: No Itching?: No  Eyes Blurred vision?: No Double vision?: No  Ears/Nose/Throat Sore throat?: No Sinus problems?: No  Hematologic/Lymphatic Swollen glands?: No Easy bruising?: No  Cardiovascular Leg swelling?: Yes Chest pain?: No  Respiratory Cough?: No Shortness of breath?: Yes  Endocrine Excessive thirst?: No  Musculoskeletal Back pain?: No Joint pain?: Yes  Neurological Headaches?: No Dizziness?: No  Psychologic Depression?:  No Anxiety?: No  Physical Exam: BP 109/78 mmHg  Pulse 70  Ht 5\' 11"  (1.803 m)  Wt 91.173 kg (201 lb)  BMI 28.05 kg/m2  Constitutional:  Alert and oriented, No acute distress. HEENT: [place] AT, moist mucus membranes.  Trachea midline, no masses. Cardiovascular: No clubbing, cyanosis, or edema. Respiratory: Normal respiratory effort, no increased work of breathing. GI: Abdomen is soft, nontender, nondistended, no abdominal masses Skin: No rashes, bruises or suspicious lesions. Lymph: No cervical or inguinal adenopathy. Neurologic: Grossly intact, no focal deficits, moving all 4 extremities. Psychiatric: Normal mood and affect.  Laboratory Data: Lab Results  Component Value Date   WBC 9.9 10/24/2015   HGB 12.9* 10/24/2015   HCT 36.7* 10/24/2015   MCV 97.3 10/24/2015   PLT 240 10/24/2015    Lab Results  Component Value Date   CREATININE 0.73 10/24/2015    No results found for: PSA  No results found for: TESTOSTERONE  No results found for: HGBA1C  Urinalysis    Component Value Date/Time   COLORURINE YELLOW* 10/24/2015 0923   APPEARANCEUR CLEAR* 10/24/2015 0923   LABSPEC 1.008 10/24/2015 0923   PHURINE 7.0 10/24/2015 0923   GLUCOSEU NEGATIVE 10/24/2015 0923   HGBUR NEGATIVE 10/24/2015 0923   BILIRUBINUR NEGATIVE 10/24/2015 0923   KETONESUR NEGATIVE 10/24/2015 0923   PROTEINUR NEGATIVE 10/24/2015 0923   NITRITE NEGATIVE 10/24/2015 0923   LEUKOCYTESUR NEGATIVE 10/24/2015 [id number]    Pertinent Imaging:   Assessment & Plan:  The patient has passed his voiding trial. He will return tomorrow for follow-up PVR. I encouraged the patient to stop taking his oxybutynin as this is likely continued into his urinary retention. His patient's PVR tomorrow is low and he is able to empty his bladder without any significant problems moving forward he does not need any additional follow-up.  There are no diagnoses linked to this encounter.  No Follow-up on file.  Ardis Hughs,  [facility] [address], [place], [place] [address] [phone]

## 2015-11-04 ENCOUNTER — Ambulatory Visit (INDEPENDENT_AMBULATORY_CARE_PROVIDER_SITE_OTHER): Payer: Medicare Other

## 2015-11-04 DIAGNOSIS — R339 Retention of urine, unspecified: Secondary | ICD-10-CM | POA: Diagnosis not present

## 2015-11-04 LAB — BLADDER SCAN AMB NON-IMAGING: SCAN RESULT: 148

## 2015-11-04 NOTE — Progress Notes (Signed)
Bladder Scan: 148 Patient can void:  Performed By: Toniann Fail, LPN

## 2016-06-15 ENCOUNTER — Encounter: Payer: Self-pay | Admitting: Family Medicine

## 2016-06-15 ENCOUNTER — Ambulatory Visit (INDEPENDENT_AMBULATORY_CARE_PROVIDER_SITE_OTHER): Payer: Medicare Other | Admitting: Family Medicine

## 2016-06-15 VITALS — BP 112/60 | HR 82 | Temp 97.9°F | Resp 16 | Wt 205.0 lb

## 2016-06-15 DIAGNOSIS — I83899 Varicose veins of unspecified lower extremities with other complications: Secondary | ICD-10-CM | POA: Diagnosis not present

## 2016-06-15 NOTE — Progress Notes (Signed)
       Patient: Douglas Calhoun Male    DOB: 12/29/1919   83 y.o.   MRN: [id number] Visit Date: 06/15/2016  Today's Provider: Lelon Huh, MD   Chief Complaint  Patient presents with  . Bleeding/Bruising   Subjective:    HPI Bleeding of the left leg:  Patient comes in today reporting that he had bleeding of the left lower leg 3 days ago. Patient had the EMS come out to his home to bandage it up. He was told that he should follow up with his PCP. Patient has not changes the dressing since the onset of bleeding 3 days ago. He reports there has been small knots that have drained from his leg. Patient says he has varicose veins in both lower legs and believes the bleeding is coming from them .     No Known Allergies   Current Outpatient Prescriptions:  .  alendronate (FOSAMAX) 70 MG tablet, Take 70 mg by mouth once a week. Take with a full glass of water on an empty stomach on Saturdays., Disp: , Rfl:  .  apixaban (ELIQUIS) 5 MG TABS tablet, Take 5 mg by mouth 2 (two) times daily., Disp: , Rfl:  .  aspirin 81 MG tablet, Take 1 tablet by mouth daily., Disp: , Rfl:  .  atorvastatin (LIPITOR) 20 MG tablet, Take 20 mg by mouth daily. 1/2 a pill at bedtime, Disp: , Rfl:  .  Calcium 500 MG tablet, Take 1 tablet by mouth daily., Disp: , Rfl:  .  Multiple Vitamins-Minerals (MULTIVITAMIN ADULT PO), Take 1 tablet by mouth daily., Disp: , Rfl:  .  tamsulosin (FLOMAX) 0.4 MG CAPS capsule, Take 0.4 mg by mouth daily after supper., Disp: , Rfl:  .  Vitamin D, Cholecalciferol, 400 UNITS TABS, Take 1 tablet by mouth daily., Disp: , Rfl:  .  Vitamin E 400 UNITS TABS, Take 1 tablet by mouth daily., Disp: , Rfl:   Review of Systems  Musculoskeletal: Positive for myalgias (on right side).  Hematological: Bruises/bleeds easily (lower left leg).    Social History  Substance Use Topics  . Smoking status: Never Smoker  . Smokeless tobacco: Never Used  . Alcohol use 0.0 oz/week     Comment: drinks 2  beers a month   Objective:   BP 112/60 (BP Location: Right Arm, Patient Position: Sitting, Cuff Size: Normal)   Pulse 82   Temp 97.9 F (36.6 C) (Oral)   Resp 16   Wt 205 lb (93 kg)   SpO2 96% Comment: room air  BMI 28.59 kg/m   Physical Exam  Removed gauze and blood encrusted bandage from left lower leg. A few small scabs noted over varicose veins. No other skin lesions.     Assessment & Plan:     1. Bleeding from varicose vein Now scabbed over. He is to keep scabs covers and leg wrapped with ACE bandage until scabs heled completely. No other signs of trauma.        Lelon Huh, MD  [facility]

## 2016-07-23 ENCOUNTER — Telehealth: Payer: Self-pay | Admitting: Family Medicine

## 2016-07-23 NOTE — Telephone Encounter (Signed)
Called Pt to schedule AWV with [facility] - knb °

## 2016-08-04 ENCOUNTER — Ambulatory Visit (INDEPENDENT_AMBULATORY_CARE_PROVIDER_SITE_OTHER): Payer: Medicare Other | Admitting: Family Medicine

## 2016-08-04 ENCOUNTER — Encounter: Payer: Self-pay | Admitting: Family Medicine

## 2016-08-04 VITALS — BP 118/62 | HR 58 | Temp 97.6°F | Resp 16 | Ht 70.0 in | Wt 210.0 lb

## 2016-08-04 DIAGNOSIS — Z0000 Encounter for general adult medical examination without abnormal findings: Secondary | ICD-10-CM | POA: Diagnosis not present

## 2016-08-04 DIAGNOSIS — F5101 Primary insomnia: Secondary | ICD-10-CM | POA: Diagnosis not present

## 2016-08-04 NOTE — Progress Notes (Signed)
Patient: Douglas Calhoun, Male    DOB: 01-Oct-1919, 83 y.o.   MRN: [id number] Visit Date: 08/04/2016  Today's Provider: Lelon Huh, MD   Chief Complaint  Patient presents with  . Annual Wellness Exam  . Anemia   Subjective:    Annual physical Douglas Calhoun is a 83 y.o. male. He feels well. He reports no regular exercise, but he does stay active. He reports he is sleeping poorly. He sleeps on average 5 hours a night.   Colonoscopy- 08/23/2003. Internal hemorrhoids.   Anemia, follow up: Patient was last seen in the office in 02/2013. No changes were made in his medications.   Review of Systems  Constitutional: Negative.   HENT: Negative.   Eyes: Negative.   Respiratory: Negative.   Cardiovascular: Negative.   Gastrointestinal: Negative.   Endocrine: Negative.   Genitourinary: Negative.   Musculoskeletal: Negative.   Skin: Negative.   Allergic/Immunologic: Negative.   Neurological: Negative.   Hematological: Negative.   Psychiatric/Behavioral: Negative.     Social History   Social History  . Marital status: Married    Spouse name: N/A  . Number of children: N/A  . Years of education: N/A   Occupational History  . retired    Social History Main Topics  . Smoking status: Never Smoker  . Smokeless tobacco: Never Used  . Alcohol use 0.0 oz/week     Comment: drinks 2 beers a month  . Drug use: No  . Sexual activity: Not on file   Other Topics Concern  . Not on file   Social History Narrative  . No narrative on file    Past Medical History:  Diagnosis Date  . Achilles tendinitis   . Cataract   . Hearing loss   . Herpes zoster without complication 04/26/7508  . Personal history of prostate cancer 10/08/2014   s/p 15 radiation treatments treated by Dr. Madelin Headings      Patient Active Problem List   Diagnosis Date Noted  . Edema 10/09/2014  . Anemia 10/08/2014  . PVC (premature ventricular contraction) 10/08/2014  . Urinary incontinence  10/08/2014  . Varicose veins of legs 10/08/2014  . Venous insufficiency 10/08/2014  . Lipoma of skin, face 06/24/2008    Past Surgical History:  Procedure Laterality Date  . APPENDECTOMY  1960's  . BREAST SURGERY     Breast Biopsy prior to 1960  . CATARACT EXTRACTION Left     His family history includes Heart disease in his mother; Stroke in his mother.      Current Outpatient Prescriptions:  .  alendronate (FOSAMAX) 70 MG tablet, Take 70 mg by mouth once a week. Take with a full glass of water on an empty stomach on Saturdays., Disp: , Rfl:  .  apixaban (ELIQUIS) 5 MG TABS tablet, Take 5 mg by mouth 2 (two) times daily., Disp: , Rfl:  .  aspirin 81 MG tablet, Take 1 tablet by mouth daily., Disp: , Rfl:  .  atorvastatin (LIPITOR) 20 MG tablet, Take 20 mg by mouth daily. 1/2 a pill at bedtime, Disp: , Rfl:  .  Calcium 500 MG tablet, Take 1 tablet by mouth daily., Disp: , Rfl:  .  Multiple Vitamins-Minerals (MULTIVITAMIN ADULT PO), Take 1 tablet by mouth daily., Disp: , Rfl:  .  tamsulosin (FLOMAX) 0.4 MG CAPS capsule, Take 0.4 mg by mouth daily after supper., Disp: , Rfl:  .  triamcinolone ointment (KENALOG) 0.1 %, Apply 1 application topically  2 (two) times daily., Disp: , Rfl:  .  Vitamin D, Cholecalciferol, 400 UNITS TABS, Take 1 tablet by mouth daily., Disp: , Rfl:  .  Vitamin E 400 UNITS TABS, Take 1 tablet by mouth daily., Disp: , Rfl:   Patient Care Team: Birdie Sons, MD as PCP - General (Family Medicine)     Objective:   Vitals: BP 118/62 (BP Location: Right Arm, Patient Position: Sitting, Cuff Size: Normal)   Pulse (!) 58   Temp 97.6 F (36.4 C)   Resp 16   Ht 5\' 10"  (1.778 m)   Wt 210 lb (95.3 kg)   SpO2 96%   BMI 30.13 kg/m   Physical Exam   General Appearance:    Alert, cooperative, no distress, appears stated age  Head:    Normocephalic, without obvious abnormality, atraumatic  Eyes:    PERRL, conjunctiva/corneas clear, EOM's intact, fundi     benign, both eyes       Ears:    Normal TM's and external ear canals, both ears  Nose:   Nares normal, septum midline, mucosa normal, no drainage   or sinus tenderness  Throat:   Lips, mucosa, and tongue normal; teeth and gums normal  Neck:   Supple, symmetrical, trachea midline, no adenopathy;       thyroid:  No enlargement/tenderness/nodules; no carotid   bruit or JVD  Back:     Symmetric, no curvature, ROM normal, no CVA tenderness  Lungs:     Clear to auscultation bilaterally, respirations unlabored  Chest wall:    No tenderness or deformity  Heart:    Regular rate and rhythm, S1 and S2 normal, no murmur, rub   or gallop  Abdomen:     Soft, non-tender, bowel sounds active all four quadrants,    no masses, no organomegaly  Genitalia:    deferred  Rectal:    deferred  Extremities:   Extremities normal, atraumatic, no cyanosis or edema  Pulses:   2+ and symmetric all extremities  Skin:   Skin color, texture, turgor normal, no rashes or lesions  Lymph nodes:   Cervical, supraclavicular, and axillary nodes normal  Neurologic:   CNII-XII intact. Normal strength, sensation and reflexes      throughout    Activities of Daily Living In your present state of health, do you have any difficulty performing the following activities: 08/04/2016  Hearing? Y  Vision? N  Difficulty concentrating or making decisions? N  Walking or climbing stairs? Y  Dressing or bathing? N  Doing errands, shopping? N  Some recent data might be hidden    Fall Risk Assessment Fall Risk  08/04/2016 10/09/2014  Falls in the past year? No Yes  Number falls in past yr: - 1  Injury with Fall? - Yes     Depression Screen PHQ 2/9 Scores 08/04/2016 08/04/2016 10/09/2014  PHQ - 2 Score 0 0 0  PHQ- 9 Score 2 - -    Cognitive Testing - 6-CIT  Correct? Score   What year is it? yes 0 0 or 4  What month is it? yes 0 0 or 3  Memorize:    Pia Mau,  44,  [place],      What time is it? (within 1 hour)  yes 0 0 or 3  Count backwards from 20 yes 0 0, 2, or 4  Name the months of the year yes 0 0, 2, or 4  Repeat name & address above  yes 0 0, 2, 4, 6, 8, or 10       TOTAL SCORE  0/28   Interpretation:  Normal  Normal (0-7) Abnormal (8-28)       Assessment & Plan:     Annual physical  Reviewed patient's Family Medical History Reviewed and updated list of patient's medical providers Assessment of cognitive impairment was done Assessed patient's functional ability Established a written schedule for health screening [place] Completed and Reviewed  Exercise Activities and Dietary recommendations Goals    None      Immunization History  Administered Date(s) Administered  . Pneumococcal Conjugate-13 03/06/2013  . Tdap 02/29/2012  . Zoster 03/26/2010    Health Maintenance  Topic Date Due  . PNA vac Low Risk Adult (2 of 2 - PPSV23) 03/06/2014  . INFLUENZA VACCINE  11/24/2016  . TETANUS/TDAP  02/28/2022     Discussed health benefits of physical activity, and encouraged him to engage in regular exercise appropriate for his age and condition.    1. Annual physical exam   2. Primary insomnia Try OTC melatonin.   The entirety of the information documented in the History of Present Illness, Review of Systems and Physical Exam were personally obtained by me. Portions of this information were initially documented by Wilburt Finlay, CMA and reviewed by me for thoroughness and accuracy.      Lelon Huh, MD  [facility]

## 2017-04-21 ENCOUNTER — Encounter: Payer: Self-pay | Admitting: Podiatry

## 2017-04-21 ENCOUNTER — Ambulatory Visit (INDEPENDENT_AMBULATORY_CARE_PROVIDER_SITE_OTHER): Payer: Medicare Other | Admitting: Podiatry

## 2017-04-21 DIAGNOSIS — M79675 Pain in left toe(s): Secondary | ICD-10-CM | POA: Diagnosis not present

## 2017-04-21 DIAGNOSIS — I872 Venous insufficiency (chronic) (peripheral): Secondary | ICD-10-CM | POA: Diagnosis not present

## 2017-04-21 DIAGNOSIS — D689 Coagulation defect, unspecified: Secondary | ICD-10-CM

## 2017-04-21 DIAGNOSIS — M79674 Pain in right toe(s): Secondary | ICD-10-CM

## 2017-04-21 DIAGNOSIS — B351 Tinea unguium: Secondary | ICD-10-CM | POA: Diagnosis not present

## 2017-04-21 NOTE — Progress Notes (Signed)
   Subjective:    Patient ID: Douglas Calhoun, male    DOB: 05-22-1919, 83 y.o.   MRN: [id number]  HPIthis patient  , presents to the office with chief complaint of long painful nails.  He says the nails are painful walking and wearing his shoes.  He says he is 83 and is unable to trim his nails himself.  He is presently taking Eliquiss.  He presents the office today for preventative foot care services    Review of Systems     Objective:   Physical Exam General Appearance  Alert, conversant and in no acute stress.  Vascular  Dorsalis pedis and posterior pulses are weakly  palpable  bilaterally.  Capillary return is within normal limits  bilaterally. Temperature is within normal limits  Bilaterally.  Neurologic  Senn-Weinstein monofilament wire test within normal limits  bilaterally. Muscle power within normal limits bilaterally.  Nails Thick disfigured discolored nails with subungual debris bilaterally from hallux to fifth toes bilaterally. No evidence of bacterial infection or drainage bilaterally.  Orthopedic  No limitations of motion of motion feet bilaterally.  No crepitus or effusions noted.  Hammer toes  1-5  B/L  Skin  normotropic skin with no porokeratosis noted bilaterally.  No signs of infections or ulcers noted.          Assessment & Plan:  Onychomycosis  B/L  IE  Debridement of nails x 10.  RTC 3 months.   Gardiner Barefoot DPM

## 2017-07-05 ENCOUNTER — Ambulatory Visit: Payer: Medicare Other | Admitting: Podiatry

## 2017-07-08 ENCOUNTER — Ambulatory Visit: Payer: Medicare Other | Admitting: Podiatry

## 2017-07-19 ENCOUNTER — Encounter: Payer: Self-pay | Admitting: Podiatry

## 2017-07-19 ENCOUNTER — Ambulatory Visit (INDEPENDENT_AMBULATORY_CARE_PROVIDER_SITE_OTHER): Payer: Medicare Other

## 2017-07-19 ENCOUNTER — Ambulatory Visit (INDEPENDENT_AMBULATORY_CARE_PROVIDER_SITE_OTHER): Payer: Medicare Other | Admitting: Podiatry

## 2017-07-19 DIAGNOSIS — M2041 Other hammer toe(s) (acquired), right foot: Secondary | ICD-10-CM | POA: Diagnosis not present

## 2017-07-19 DIAGNOSIS — M79674 Pain in right toe(s): Secondary | ICD-10-CM

## 2017-07-19 DIAGNOSIS — B351 Tinea unguium: Secondary | ICD-10-CM

## 2017-07-19 DIAGNOSIS — M79675 Pain in left toe(s): Secondary | ICD-10-CM

## 2017-07-19 DIAGNOSIS — M79676 Pain in unspecified toe(s): Secondary | ICD-10-CM

## 2017-07-21 ENCOUNTER — Ambulatory Visit: Payer: Medicare Other | Admitting: Podiatry

## 2017-07-21 NOTE — Progress Notes (Signed)
   HPI: 84 year old male presenting today with a chief complaint of elongated, thickened nails of bilateral feet that cause pain while ambulating in shoes. He is unable to trim his own nails.  He also has a new complaint of pain to the right second toe secondary to a hammertoe deformity that has bene symptomatic for about two years. He reports associated pain to the dorsum of the right foot. Walking and bearing weight increases the pain. He has not done anything to treat the symptoms. Patient is here for further evaluation and treatment.   Past Medical History:  Diagnosis Date  . Achilles tendinitis   . Cataract   . Hearing loss   . Herpes zoster without complication 9/92/4268  . Personal history of prostate cancer 10/08/2014   s/p 15 radiation treatments treated by Dr. Madelin Headings       Objective: Physical Exam General: The patient is alert and oriented x3 in no acute distress.  Dermatology: Skin is cool, dry and supple bilateral lower extremities. Negative for open lesions or macerations. Nails are tender, long, thickened and dystrophic with subungual debris, consistent with onychomycosis, 1-5 bilateral. No signs of infection noted.  Vascular: Palpable pedal pulses bilaterally. No edema or erythema noted. Capillary refill within normal limits.  Neurological: Epicritic and protective threshold grossly intact bilaterally.   Musculoskeletal Exam: All pedal and ankle joints range of motion within normal limits bilateral. Muscle strength 5/5 in all groups bilateral. Hammertoe contracture deformity noted to digits 2-5 of the bilateral feet.  Radiographic Exam: Hammertoe contracture deformity noted to the interphalangeal joints and MPJ of the respective hammertoe digits mentioned on clinical musculoskeletal exam.     Assessment: 1. Onychodystrophic nails 1-5 bilateral with hyperkeratosis of nails.  2. Onychomycosis of nail due to dermatophyte bilateral 3. Pain in foot bilateral 4. Hammertoes  2-5 bilateral   Plan of Care:  1. Patient evaluated. X-Rays reviewed.  2. nstructed to maintain good pedal hygiene and foot care.  3. Mechanical debridement of nails 1-5 bilaterally performed using a nail nipper. Filed with dremel without incident. 4. Toe crescent pads dispensed.  5. Return to clinic in 3 months.     Edrick Kins, DPM [facility]  Dr. Edrick Kins, DPM    [address], [place] [address]                Office [phone]  Fax [phone]

## 2017-11-07 DIAGNOSIS — S46019A Strain of muscle(s) and tendon(s) of the rotator cuff of unspecified shoulder, initial encounter: Secondary | ICD-10-CM | POA: Insufficient documentation

## 2017-12-16 ENCOUNTER — Ambulatory Visit: Payer: Self-pay | Admitting: Family Medicine

## 2017-12-16 ENCOUNTER — Telehealth: Payer: Self-pay

## 2017-12-16 ENCOUNTER — Telehealth: Payer: Self-pay | Admitting: Family Medicine

## 2017-12-16 NOTE — Telephone Encounter (Signed)
Patients neighbor Fraser Din called stating that patient's stools are black. He has been constipated for 3 days. His orthopedic doctor has him taking Meloxicam for a back fracture. Fraser Din says that the orthopedist gave him 2 rectal suppositories to help with constipation, but this has not helped. Patient denies any stomach pain, fever, weakness, dizziness, or vomiting. Patient complains of some nausea. I consulted with Dr. Caryn Section and appointment was scheduled for today at 3:40pm. I advised Fraser Din that patient should go to the ER if patient started to feel weak or dizzy, or if he developed any symptoms as stated above. Pat verbally voiced understanding.

## 2017-12-16 NOTE — Telephone Encounter (Signed)
Pt had an acute  appt today 12/16/17 for black stools.  He cancelled his appt saying he just had a normal stool and thinks he had the black stool due to taking iron.  Thanks [employer]

## 2018-03-29 ENCOUNTER — Emergency Department: Payer: Medicare Other

## 2018-03-29 ENCOUNTER — Other Ambulatory Visit: Payer: Self-pay

## 2018-03-29 ENCOUNTER — Inpatient Hospital Stay
Admission: EM | Admit: 2018-03-29 | Discharge: 2018-03-31 | DRG: 291 | Disposition: A | Discharge status: Home Health | Payer: Medicare Other | Attending: Internal Medicine | Admitting: Internal Medicine

## 2018-03-29 ENCOUNTER — Encounter: Payer: Self-pay | Admitting: Emergency Medicine

## 2018-03-29 DIAGNOSIS — I5020 Unspecified systolic (congestive) heart failure: Secondary | ICD-10-CM

## 2018-03-29 DIAGNOSIS — Z7982 Long term (current) use of aspirin: Secondary | ICD-10-CM | POA: Diagnosis not present

## 2018-03-29 DIAGNOSIS — I4892 Unspecified atrial flutter: Secondary | ICD-10-CM | POA: Diagnosis present

## 2018-03-29 DIAGNOSIS — M810 Age-related osteoporosis without current pathological fracture: Secondary | ICD-10-CM | POA: Diagnosis present

## 2018-03-29 DIAGNOSIS — R0600 Dyspnea, unspecified: Secondary | ICD-10-CM

## 2018-03-29 DIAGNOSIS — Z8546 Personal history of malignant neoplasm of prostate: Secondary | ICD-10-CM | POA: Diagnosis not present

## 2018-03-29 DIAGNOSIS — I482 Chronic atrial fibrillation, unspecified: Secondary | ICD-10-CM | POA: Diagnosis present

## 2018-03-29 DIAGNOSIS — Z923 Personal history of irradiation: Secondary | ICD-10-CM | POA: Diagnosis not present

## 2018-03-29 DIAGNOSIS — R0902 Hypoxemia: Secondary | ICD-10-CM

## 2018-03-29 DIAGNOSIS — W19XXXA Unspecified fall, initial encounter: Secondary | ICD-10-CM

## 2018-03-29 DIAGNOSIS — E785 Hyperlipidemia, unspecified: Secondary | ICD-10-CM | POA: Diagnosis present

## 2018-03-29 DIAGNOSIS — H9190 Unspecified hearing loss, unspecified ear: Secondary | ICD-10-CM | POA: Diagnosis present

## 2018-03-29 DIAGNOSIS — Z7901 Long term (current) use of anticoagulants: Secondary | ICD-10-CM | POA: Diagnosis not present

## 2018-03-29 DIAGNOSIS — I5023 Acute on chronic systolic (congestive) heart failure: Secondary | ICD-10-CM | POA: Diagnosis present

## 2018-03-29 DIAGNOSIS — I493 Ventricular premature depolarization: Secondary | ICD-10-CM | POA: Diagnosis present

## 2018-03-29 DIAGNOSIS — Z79899 Other long term (current) drug therapy: Secondary | ICD-10-CM

## 2018-03-29 DIAGNOSIS — J9601 Acute respiratory failure with hypoxia: Secondary | ICD-10-CM | POA: Diagnosis present

## 2018-03-29 DIAGNOSIS — I509 Heart failure, unspecified: Secondary | ICD-10-CM

## 2018-03-29 DIAGNOSIS — I110 Hypertensive heart disease with heart failure: Secondary | ICD-10-CM | POA: Diagnosis present

## 2018-03-29 DIAGNOSIS — R0602 Shortness of breath: Secondary | ICD-10-CM | POA: Diagnosis not present

## 2018-03-29 LAB — BASIC METABOLIC PANEL
Anion gap: 11 (ref 5–15)
BUN: 16 mg/dL (ref 8–23)
CO2: 21 mmol/L — ABNORMAL LOW (ref 22–32)
Calcium: 8.7 mg/dL — ABNORMAL LOW (ref 8.9–10.3)
Chloride: 98 mmol/L (ref 98–111)
Creatinine, Ser: 0.55 mg/dL — ABNORMAL LOW (ref 0.61–1.24)
GFR calc Af Amer: >60 mL/min (ref >60)
GFR calc non Af Amer: >60 mL/min (ref >60)
Glucose, Bld: 120 mg/dL — ABNORMAL HIGH (ref 70–99)
Potassium: 4.8 mmol/L (ref 3.5–5.1)
Sodium: 130 mmol/L — ABNORMAL LOW (ref 135–145)

## 2018-03-29 LAB — CBC
HCT: 39.6 % (ref 39.0–52.0)
Hemoglobin: 13.3 g/dL (ref 13.0–17.0)
MCH: 34.6 pg — ABNORMAL HIGH (ref 26.0–34.0)
MCHC: 33.6 g/dL (ref 30.0–36.0)
MCV: 103.1 fL — ABNORMAL HIGH (ref 80.0–100.0)
PLATELETS: 266 10*3/uL (ref 150–400)
RBC: 3.84 MIL/uL — ABNORMAL LOW (ref 4.22–5.81)
RDW: 12.8 % (ref 11.5–15.5)
WBC: 11.5 10*3/uL — AB (ref 4.0–10.5)
nRBC: 0 % (ref 0.0–0.2)

## 2018-03-29 LAB — BRAIN NATRIURETIC PEPTIDE: B Natriuretic Peptide: 221 pg/mL — ABNORMAL HIGH (ref 0.0–100.0)

## 2018-03-29 LAB — TROPONIN I

## 2018-03-29 MED ORDER — FUROSEMIDE 10 MG/ML IJ SOLN
20.0000 mg | Freq: Once | INTRAMUSCULAR | Status: AC
  Administered 2018-03-29: 20 mg via INTRAVENOUS
  Filled 2018-03-29: qty 4

## 2018-03-29 NOTE — ED Provider Notes (Addendum)
[facility] Emergency Department Provider Note   ____________________________________________   First MD Initiated Contact with Patient 03/29/18 1809     (approximate)  I have reviewed the triage vital signs and the nursing notes.   HISTORY  Chief Complaint Shortness of Breath    HPI Douglas Calhoun is a 84 y.o. male who reports he fell about 2 days ago.  He lost his balance he initially said he did not hit his head but he told me that he did.  He says his left upper chest and upper back are tender to palpation diffusely.  He did not pass out.  He says he is not short of breath just puffing because it hurts.  Pain is moderate he says.   Past Medical History:  Diagnosis Date  . Achilles tendinitis   . Cataract   . Hearing loss   . Herpes zoster without complication 7/34/1937  . Personal history of prostate cancer 10/08/2014   s/p 15 radiation treatments treated by Dr. Madelin Headings     Patient Active Problem List   Diagnosis Date Noted  . Edema 10/09/2014  . Anemia 10/08/2014  . PVC (premature ventricular contraction) 10/08/2014  . Urinary incontinence 10/08/2014  . Varicose veins of legs 10/08/2014  . Venous insufficiency 10/08/2014  . Lipoma of skin, face 06/24/2008    Past Surgical History:  Procedure Laterality Date  . APPENDECTOMY  1960's  . BREAST SURGERY     Breast Biopsy prior to 1960  . CATARACT EXTRACTION Left     Prior to Admission medications   Medication Sig Start Date End Date Taking? Authorizing Provider  alendronate (FOSAMAX) 70 MG tablet Take 70 mg by mouth once a week. Take with a full glass of water on an empty stomach on Saturdays.    [provider]  apixaban (ELIQUIS) 5 MG TABS tablet Take 5 mg by mouth 2 (two) times daily.    [provider]  aspirin EC 81 MG tablet Take by mouth.    [provider]  atorvastatin (LIPITOR) 20 MG tablet Take 20 mg by mouth daily. 1/2 a pill at bedtime    [provider]  Calcium 500 MG tablet Take 1 tablet by mouth daily. 07/17/08   [provider]  camphor-menthol Timoteo Ace) lotion Apply 1 application topically as needed for itching.    [provider]  Cholecalciferol (VITAMIN D3) 1000 units CAPS Take by mouth.    [provider]  Cyanocobalamin 1000 MCG CAPS Take by mouth.    [provider]  ferrous sulfate 325 (65 FE) MG tablet Take 325 mg by mouth daily with breakfast.    [provider]  furosemide (LASIX) 20 MG tablet Take 20 mg by mouth.    [provider]  isosorbide dinitrate (ISORDIL) 30 MG tablet Take 30 mg by mouth 4 (four) times daily.    [provider]  Multiple Vitamins-Minerals (MULTIVITAMIN ADULT PO) Take 1 tablet by mouth daily. 07/17/08   [provider]  Prenatal Vit-Fe Fumarate-FA (PRENATAL PO) Take by mouth.    [provider]  pyridOXINE (B-6) 50 MG tablet Take 50 mg by mouth daily.    [provider]  Skin Protectants, Misc. (EUCERIN) cream Apply topically as needed for dry skin.    [provider]  tamsulosin (FLOMAX) 0.4 MG CAPS capsule Take 0.4 mg by mouth daily after supper.    [provider]  triamcinolone ointment (KENALOG) 0.1 % Apply 1  application topically 2 (two) times daily.    [provider]  Vitamin E 400 UNITS TABS Take 1 tablet by mouth daily. 07/17/08   [provider]    Allergies Patient has no known allergies.  Family History  Problem Relation Age of Onset  . Heart disease Mother   . Stroke Mother     Social History Social History   Tobacco Use  . Smoking status: Never Smoker  . Smokeless tobacco: Never Used  Substance Use Topics  . Alcohol use: Yes    Alcohol/week: 0.0 standard drinks    Comment: drinks 2 beers a month  . Drug use: No    Review of Systems  Constitutional: No fever/chills Eyes: No visual changes. ENT: No sore throat. Cardiovascular:  Mechanical chest pain. Respiratory: Denies shortness of breath. Gastrointestinal: No abdominal pain.  No nausea, no vomiting.  No diarrhea.  No constipation. Genitourinary: Negative for dysuria. Musculoskeletal:  back pain. Skin: Negative for rash. Neurological: Negative for headaches, focal weakness or   ____________________________________________   PHYSICAL EXAM:  VITAL SIGNS: ED Triage Vitals  Enc Vitals Group     BP 03/29/18 1737 (!) 152/77     Pulse Rate 03/29/18 1737 84     Resp 03/29/18 1737 (!) 22     Temp --      Temp Source 03/29/18 1743 Oral     SpO2 03/29/18 1737 95 %     Weight --      Height --      Head Circumference --      Peak Flow --      Pain Score 03/29/18 1740 6     Pain Loc --      Pain Edu? --      Excl. in [place]? --     Constitutional: Alert and oriented. Well appearing and in mild  distress. Eyes: Conjunctivae are normal. Head: Atraumatic. Nose: No congestion/rhinnorhea. Mouth/Throat: Mucous membranes are moist.  Oropharynx non-erythematous. Neck: No stridor.   Cardiovascular: Normal rate, regular rhythm. Grossly normal heart sounds.  Good peripheral circulation. Respiratory: Normal respiratory effort.  No retractions. Lungs CTAB.  There is tenderness on palpation of the upper left chest and across the back especially in the left side Gastrointestinal: Soft and nontender. No distention. No abdominal bruits. No CVA tenderness. Musculoskeletal: No lower extremity tenderness nor edema.  Neurologic:  Normal speech and language. No gross focal neurologic deficits are appreciated. Skin:  Skin is warm, dry and intact. No rash noted. Psychiatric: Mood and affect are normal. Speech and behavior are normal.  ____________________________________________   LABS (all labs ordered are listed, but only abnormal results are displayed)  Labs Reviewed  BASIC METABOLIC PANEL - Abnormal; Notable for the following components:      Result Value   Sodium 130 (*)     CO2 21 (*)    Glucose, Bld 120 (*)    Creatinine, Ser 0.55 (*)    Calcium 8.7 (*)    All other components within normal limits  CBC - Abnormal; Notable for the following components:   WBC 11.5 (*)    RBC 3.84 (*)    MCV 103.1 (*)    MCH 34.6 (*)    All other components within normal limits  BRAIN NATRIURETIC PEPTIDE - Abnormal; Notable for the following components:   B Natriuretic Peptide 221.0 (*)    All other components within normal limits  TROPONIN I  TROPONIN I   ____________________________________________  EKG EKG #1 read  and interpreted by me shows what appears to be a flutter at a rate of 87 left axis no acute ST-T wave changes except for hint of ST elevation in aVR which again was present on old EKGs computer is reading an acute MI I do not see that.  EKG #2 read and interpreted by me looks like a flutter which is very irregular mostly at a rate of 92 left axis several PVCs there is a hint of ST elevation in aVR but this was present on old EKGs ____________________________________________  RADIOLOGY  ED MD interpretation: Chest x-ray looks like increased lung markings consistent with CHF radiologist read needs this and I agree after reviewing the film CT of the head and neck show no acute problems indeed the patient's neck is not tender but I CT digesting cases I have frequently found patients with no neck tenderness that has had acute C-spine fractures when they are older.  Official radiology report(s): Dg Chest 2 View  Result Date: 03/29/2018 CLINICAL DATA:  Shortness of breath and left rib pain. EXAM: CHEST - 2 VIEW COMPARISON:  04/07/2015 FINDINGS: Enlarged cardiac silhouette. Calcific atherosclerotic disease of the aorta. Mediastinal contours appear intact. There is no evidence of focal airspace consolidation, or pneumothorax. Mild coarsening of the interstitial markings. Bibasilar atelectasis. Left lateral pleural thickening, nonspecific. Healed right-sided rib  fractures.  Soft tissues are grossly normal. IMPRESSION: Mild interstitial pulmonary edema. Enlarged cardiac silhouette. No evidence of lobar consolidation. Electronically Signed   By: Fidela Salisbury M.D.   On: 03/29/2018 18:42   Dg Thoracic Spine 2 View  Result Date: 03/29/2018 CLINICAL DATA:  Fall a few days ago.  Upper back pain. EXAM: THORACIC SPINE 2 VIEWS COMPARISON:  Chest x-rays since November 19, 2014 FINDINGS: Anterior wedging of a lower thoracic or upper lumbar vertebral body, unchanged since 2016. Degenerative changes, most marked did in the upper lumbar spine. T1 and T2 were best evaluated on the C-spine CT from today without fracture. IMPRESSION: No acute fracture or traumatic malalignment.  Degenerative changes. Electronically Signed   By: Dorise Bullion III M.D   On: 03/29/2018 19:17   Ct Head Wo Contrast  Result Date: 03/29/2018 CLINICAL DATA:  Fall a few days ago.  Pain. EXAM: CT HEAD WITHOUT CONTRAST CT CERVICAL SPINE WITHOUT CONTRAST TECHNIQUE: Multidetector CT imaging of the head and cervical spine was performed following the standard protocol without intravenous contrast. Multiplanar CT image reconstructions of the cervical spine were also generated. COMPARISON:  None. FINDINGS: CT HEAD FINDINGS Brain: No subdural, epidural, or subarachnoid hemorrhage. White matter changes are noted, chronic in appearance. No acute cortical ischemia infarct. Cerebellum, brainstem, and basal cisterns are normal. Ventricles and sulci are unremarkable. No mass effect or midline shift. Vascular: No hyperdense vessel or unexpected calcification. Skull: Normal. Negative for fracture or focal lesion. Sinuses/Orbits: No acute finding. Other: None. CT CERVICAL SPINE FINDINGS Alignment: There is 4 mm of anterolisthesis of C5 versus C6. No other malalignment. Skull base and vertebrae: No acute fracture. No primary bone lesion or focal pathologic process. Soft tissues and spinal canal: No prevertebral fluid or  swelling. No visible canal hematoma. Disc levels: Multilevel degenerative disc disease and facet degenerative changes. Upper chest: Negative. Other: No other abnormalities. IMPRESSION: 1. No acute intracranial abnormalities. 2. 4 mm of anterolisthesis of C5 versus C6 is favored to be degenerative. No fractures noted. No traumatic malalignment. Electronically Signed   By: Dorise Bullion III M.D   On: 03/29/2018 19:15   Ct  Cervical Spine Wo Contrast  Result Date: 03/29/2018 CLINICAL DATA:  Fall a few days ago.  Pain. EXAM: CT HEAD WITHOUT CONTRAST CT CERVICAL SPINE WITHOUT CONTRAST TECHNIQUE: Multidetector CT imaging of the head and cervical spine was performed following the standard protocol without intravenous contrast. Multiplanar CT image reconstructions of the cervical spine were also generated. COMPARISON:  None. FINDINGS: CT HEAD FINDINGS Brain: No subdural, epidural, or subarachnoid hemorrhage. White matter changes are noted, chronic in appearance. No acute cortical ischemia infarct. Cerebellum, brainstem, and basal cisterns are normal. Ventricles and sulci are unremarkable. No mass effect or midline shift. Vascular: No hyperdense vessel or unexpected calcification. Skull: Normal. Negative for fracture or focal lesion. Sinuses/Orbits: No acute finding. Other: None. CT CERVICAL SPINE FINDINGS Alignment: There is 4 mm of anterolisthesis of C5 versus C6. No other malalignment. Skull base and vertebrae: No acute fracture. No primary bone lesion or focal pathologic process. Soft tissues and spinal canal: No prevertebral fluid or swelling. No visible canal hematoma. Disc levels: Multilevel degenerative disc disease and facet degenerative changes. Upper chest: Negative. Other: No other abnormalities. IMPRESSION: 1. No acute intracranial abnormalities. 2. 4 mm of anterolisthesis of C5 versus C6 is favored to be degenerative. No fractures noted. No traumatic malalignment. Electronically Signed   By: Dorise Bullion III M.D   On: 03/29/2018 19:15    ____________________________________________   PROCEDURES  Procedure(s) performed:   Procedures  Critical Care performed:   ____________________________________________   INITIAL IMPRESSION / ASSESSMENT AND PLAN / ED COURSE  Patient wants to go home.  However he is apparently in new a flutter with frequent PVCs.  His O2 saturations is fluctuating with good waveform from 86 up to 92 again.  He lives by himself as well.  I think the safest thing to do would be watch him overnight consult cardiology in the morning and make sure his congestive heart failure has improved more.      ____________________________________________   FINAL CLINICAL IMPRESSION(S) / ED DIAGNOSES  Final diagnoses:  Dyspnea, unspecified type  Fall, initial encounter  Systolic congestive heart failure, unspecified HF chronicity ([place])  Hypoxia     ED Discharge Orders    None       Note:  This document was prepared using Dragon voice recognition software and may include unintentional dictation errors.    Nena Polio, MD 03/29/18 2156    Nena Polio, MD 03/29/18 2200

## 2018-03-29 NOTE — ED Notes (Signed)
Pt put on 2L [place] per MD [facility].

## 2018-03-29 NOTE — ED Notes (Addendum)
FIRST NURSE NOTE:  Pt had a fall a few days ago, was seen at urgent care and was referred to ED.  Pt ambulatory in lobby with walker.  Family states the pt lost his balance in his bedroom, states he did not hit his head.

## 2018-03-29 NOTE — ED Triage Notes (Signed)
PT to ED via POV with c/o SOB and LFT rib area pain after mechanical fall. Pt unaware if he hit his head, denies LOC. Pt denies pain with breathing states " I'm just puffing but not SOB".

## 2018-03-29 NOTE — H&P (Signed)
[facility] [place] NAME: Douglas Calhoun    MR#:  [id number]  DATE OF BIRTH:  19-Jun-1919  DATE OF ADMISSION:  03/29/2018  PRIMARY CARE PHYSICIAN: Birdie Sons, MD   REQUESTING/REFERRING PHYSICIAN:   CHIEF COMPLAINT:   Chief Complaint  Patient presents with  . Shortness of Breath    HISTORY OF PRESENT ILLNESS: Douglas Calhoun  is a 84 y.o. male with a known history of hearing loss, cataracts, prostate cancer, status post radiation treatment, hypertension, hyperlipidemia, osteoporosis and other comorbidities.  He lives by himself. Patient is a poor historian, due to poor memory and hearing deficit.  He usually follows with [facility].  Most information was obtained from reviewing the medical records and from discussion with emergency room physician. Patient was brought to emergency room due to shortness of breath with exertion and left upper back pain, status post a mechanical fall 2 days ago.  He noted shortness of breath with exertion going on for the past 3 to 4 days, gradually getting worse.  He denies any dizziness, chest pain, palpitations, fever or chills, no cough, no nausea, no vomiting, no diarrhea, no bleeding. At the arrival to emergency room, patient was noted with low oxygen saturation, at 86% on room air. Blood test done emergency room including CBC and CMP are grossly unremarkable except for WBC at 11.5.  The first 2 troponin levels are less than 0.03. EKG shows atrial flutter with heart rate at 92 and PVCs.  No acute ischemic changes. Chest x-ray shows mild interstitial pulmonary edema. No acute abnormalities per brain CT and cervical spine CT. Is admitted for further evaluation and treatment.  PAST MEDICAL HISTORY:   Past Medical History:  Diagnosis Date  . Achilles tendinitis   . Cataract   . Hearing loss   . Herpes zoster without complication 0/93/8182  . Personal history of prostate cancer 10/08/2014   s/p 15  radiation treatments treated by Dr. Madelin Headings     PAST SURGICAL HISTORY:  Past Surgical History:  Procedure Laterality Date  . APPENDECTOMY  1960's  . BREAST SURGERY     Breast Biopsy prior to 1960  . CATARACT EXTRACTION Left     SOCIAL HISTORY:  Social History   Tobacco Use  . Smoking status: Never Smoker  . Smokeless tobacco: Never Used  Substance Use Topics  . Alcohol use: Yes    Alcohol/week: 0.0 standard drinks    Comment: drinks 2 beers a month    FAMILY HISTORY:  Family History  Problem Relation Age of Onset  . Heart disease Mother   . Stroke Mother     DRUG ALLERGIES: No Known Allergies  REVIEW OF SYSTEMS:   CONSTITUTIONAL: No fever, fatigue or weakness.  EYES: No changes in vision.  EARS, NOSE, AND THROAT: No tinnitus or ear pain.  Positive for hearing deficit. RESPIRATORY: Positive for shortness of breath with exertion.  No cough, wheezing or hemoptysis.  CARDIOVASCULAR: No chest pain, orthopnea, edema.  GASTROINTESTINAL: No nausea, vomiting, diarrhea or abdominal pain.  GENITOURINARY: No dysuria, hematuria.  ENDOCRINE: No polyuria, nocturia. HEMATOLOGY: No bleeding. SKIN: No rash or lesion. MUSCULOSKELETAL: Positive for left upper back pain and neck pain, secondary to recent fall.   NEUROLOGIC: No focal weakness.  PSYCHIATRY: No anxiety or depression.   MEDICATIONS AT HOME:  Prior to Admission medications   Medication Sig Start Date End Date Taking? Authorizing Provider  alendronate (FOSAMAX) 70 MG tablet Take 70 mg  by mouth once a week. Take with a full glass of water on an empty stomach on Saturdays.    [provider]  apixaban (ELIQUIS) 5 MG TABS tablet Take 5 mg by mouth 2 (two) times daily.    [provider]  aspirin EC 81 MG tablet Take by mouth.    [provider]  atorvastatin (LIPITOR) 20 MG tablet Take 20 mg by mouth daily. 1/2 a pill at bedtime    [provider]  Calcium 500 MG tablet Take 1 tablet by  mouth daily. 07/17/08   [provider]  camphor-menthol Timoteo Ace) lotion Apply 1 application topically as needed for itching.    [provider]  Cholecalciferol (VITAMIN D3) 1000 units CAPS Take by mouth.    [provider]  Cyanocobalamin 1000 MCG CAPS Take by mouth.    [provider]  ferrous sulfate 325 (65 FE) MG tablet Take 325 mg by mouth daily with breakfast.    [provider]  furosemide (LASIX) 20 MG tablet Take 20 mg by mouth.    [provider]  isosorbide dinitrate (ISORDIL) 30 MG tablet Take 30 mg by mouth 4 (four) times daily.    [provider]  Multiple Vitamins-Minerals (MULTIVITAMIN ADULT PO) Take 1 tablet by mouth daily. 07/17/08   [provider]  Prenatal Vit-Fe Fumarate-FA (PRENATAL PO) Take by mouth.    [provider]  pyridOXINE (B-6) 50 MG tablet Take 50 mg by mouth daily.    [provider]  Skin Protectants, Misc. (EUCERIN) cream Apply topically as needed for dry skin.    [provider]  tamsulosin (FLOMAX) 0.4 MG CAPS capsule Take 0.4 mg by mouth daily after supper.    [provider]  triamcinolone ointment (KENALOG) 0.1 % Apply 1 application topically 2 (two) times daily.    [provider]  Vitamin E 400 UNITS TABS Take 1 tablet by mouth daily. 07/17/08   [provider]      PHYSICAL EXAMINATION:   VITAL SIGNS: Blood pressure (!) 141/82, pulse 92, resp. rate 18, SpO2 95 %.  GENERAL:  84 y.o.-year-old patient lying in the bed with no acute distress.  EYES: Pupils equal, round, reactive to light and accommodation. No scleral icterus. Extraocular muscles intact.  HEENT: Head atraumatic, normocephalic. Oropharynx and nasopharynx clear.  NECK:  Supple, no jugular venous distention. No thyroid enlargement, no tenderness.  LUNGS: Normal breath sounds bilaterally, no wheezing, rales,rhonchi or crepitation. No use of accessory muscles of  respiration.  CARDIOVASCULAR: S1, S2 normal. No S3/S4.  ABDOMEN: Soft, nontender, nondistended. Bowel sounds present. No organomegaly or mass.  MUSCULOSKELETAL: There is tenderness with palpation at cervical and thoracic spine level.  NEUROLOGIC no focal weakness:  PSYCHIATRIC: The patient is alert and oriented x 3.  SKIN: No obvious rash, lesion, or ulcer.   LABORATORY PANEL:   CBC Recent Labs  Lab 03/29/18 1741  WBC 11.5*  HGB 13.3  HCT 39.6  PLT 266  MCV 103.1*  MCH 34.6*  MCHC 33.6  RDW 12.8   ------------------------------------------------------------------------------------------------------------------  Chemistries  Recent Labs  Lab 03/29/18 1741  NA 130*  K 4.8  CL 98  CO2 21*  GLUCOSE 120*  BUN 16  CREATININE 0.55*  CALCIUM 8.7*   ------------------------------------------------------------------------------------------------------------------ CrCl cannot be calculated (Unknown ideal weight.). ------------------------------------------------------------------------------------------------------------------ No results for input(s): TSH, T4TOTAL, T3FREE, THYROIDAB in the last 72 hours.  Invalid input(s): FREET3   Coagulation profile No results for input(s): INR, PROTIME  in the last 168 hours. ------------------------------------------------------------------------------------------------------------------- No results for input(s): DDIMER in the last 72 hours. -------------------------------------------------------------------------------------------------------------------  Cardiac Enzymes Recent Labs  Lab 03/29/18 1741 03/29/18 1950  TROPONINI <0.03 <0.03   ------------------------------------------------------------------------------------------------------------------ Invalid input(s): POCBNP  ---------------------------------------------------------------------------------------------------------------  Urinalysis    Component Value  Date/Time   COLORURINE YELLOW (A) 10/24/2015 0923   APPEARANCEUR CLEAR (A) 10/24/2015 0923   LABSPEC 1.008 10/24/2015 0923   PHURINE 7.0 10/24/2015 0923   GLUCOSEU NEGATIVE 10/24/2015 0923   HGBUR NEGATIVE 10/24/2015 0923   BILIRUBINUR NEGATIVE 10/24/2015 0923   KETONESUR NEGATIVE 10/24/2015 0923   PROTEINUR NEGATIVE 10/24/2015 0923   NITRITE NEGATIVE 10/24/2015 0923   LEUKOCYTESUR NEGATIVE 10/24/2015 0923     RADIOLOGY: Dg Chest 2 View  Result Date: 03/29/2018 CLINICAL DATA:  Shortness of breath and left rib pain. EXAM: CHEST - 2 VIEW COMPARISON:  04/07/2015 FINDINGS: Enlarged cardiac silhouette. Calcific atherosclerotic disease of the aorta. Mediastinal contours appear intact. There is no evidence of focal airspace consolidation, or pneumothorax. Mild coarsening of the interstitial markings. Bibasilar atelectasis. Left lateral pleural thickening, nonspecific. Healed right-sided rib fractures.  Soft tissues are grossly normal. IMPRESSION: Mild interstitial pulmonary edema. Enlarged cardiac silhouette. No evidence of lobar consolidation. Electronically Signed   By: Fidela Salisbury M.D.   On: 03/29/2018 18:42   Dg Thoracic Spine 2 View  Result Date: 03/29/2018 CLINICAL DATA:  Fall a few days ago.  Upper back pain. EXAM: THORACIC SPINE 2 VIEWS COMPARISON:  Chest x-rays since November 19, 2014 FINDINGS: Anterior wedging of a lower thoracic or upper lumbar vertebral body, unchanged since 2016. Degenerative changes, most marked did in the upper lumbar spine. T1 and T2 were best evaluated on the C-spine CT from today without fracture. IMPRESSION: No acute fracture or traumatic malalignment.  Degenerative changes. Electronically Signed   By: Dorise Bullion III M.D   On: 03/29/2018 19:17   Ct Head Wo Contrast  Result Date: 03/29/2018 CLINICAL DATA:  Fall a few days ago.  Pain. EXAM: CT HEAD WITHOUT CONTRAST CT CERVICAL SPINE WITHOUT CONTRAST TECHNIQUE: Multidetector CT imaging of the head and  cervical spine was performed following the standard protocol without intravenous contrast. Multiplanar CT image reconstructions of the cervical spine were also generated. COMPARISON:  None. FINDINGS: CT HEAD FINDINGS Brain: No subdural, epidural, or subarachnoid hemorrhage. White matter changes are noted, chronic in appearance. No acute cortical ischemia infarct. Cerebellum, brainstem, and basal cisterns are normal. Ventricles and sulci are unremarkable. No mass effect or midline shift. Vascular: No hyperdense vessel or unexpected calcification. Skull: Normal. Negative for fracture or focal lesion. Sinuses/Orbits: No acute finding. Other: None. CT CERVICAL SPINE FINDINGS Alignment: There is 4 mm of anterolisthesis of C5 versus C6. No other malalignment. Skull base and vertebrae: No acute fracture. No primary bone lesion or focal pathologic process. Soft tissues and spinal canal: No prevertebral fluid or swelling. No visible canal hematoma. Disc levels: Multilevel degenerative disc disease and facet degenerative changes. Upper chest: Negative. Other: No other abnormalities. IMPRESSION: 1. No acute intracranial abnormalities. 2. 4 mm of anterolisthesis of C5 versus C6 is favored to be degenerative. No fractures noted. No traumatic malalignment. Electronically Signed   By: Dorise Bullion III M.D   On: 03/29/2018 19:15   Ct Cervical Spine Wo Contrast  Result Date: 03/29/2018 CLINICAL DATA:  Fall a few days ago.  Pain. EXAM: CT HEAD WITHOUT CONTRAST CT CERVICAL SPINE WITHOUT CONTRAST TECHNIQUE: Multidetector CT imaging of the head and cervical spine was performed following the standard  protocol without intravenous contrast. Multiplanar CT image reconstructions of the cervical spine were also generated. COMPARISON:  None. FINDINGS: CT HEAD FINDINGS Brain: No subdural, epidural, or subarachnoid hemorrhage. White matter changes are noted, chronic in appearance. No acute cortical ischemia infarct. Cerebellum, brainstem,  and basal cisterns are normal. Ventricles and sulci are unremarkable. No mass effect or midline shift. Vascular: No hyperdense vessel or unexpected calcification. Skull: Normal. Negative for fracture or focal lesion. Sinuses/Orbits: No acute finding. Other: None. CT CERVICAL SPINE FINDINGS Alignment: There is 4 mm of anterolisthesis of C5 versus C6. No other malalignment. Skull base and vertebrae: No acute fracture. No primary bone lesion or focal pathologic process. Soft tissues and spinal canal: No prevertebral fluid or swelling. No visible canal hematoma. Disc levels: Multilevel degenerative disc disease and facet degenerative changes. Upper chest: Negative. Other: No other abnormalities. IMPRESSION: 1. No acute intracranial abnormalities. 2. 4 mm of anterolisthesis of C5 versus C6 is favored to be degenerative. No fractures noted. No traumatic malalignment. Electronically Signed   By: Dorise Bullion III M.D   On: 03/29/2018 19:15    EKG: Orders placed or performed during the hospital encounter of 03/29/18  . ED EKG within 10 minutes  . ED EKG within 10 minutes  . EKG 12-Lead  . EKG 12-Lead  . EKG 12-Lead  . EKG 12-Lead    IMPRESSION AND PLAN:  1.  Acute respiratory failure with hypoxia, likely secondary to acute CHF exacerbation.  We will start patient on Lasix IV and oxygen therapy.  Will check 2D echo and continue to monitor patient on telemetry.  Cardiology is consulted for further evaluation and treatment. 2.  Acute CHF exacerbation, see management as above under #1. 3.  Atrial flutter.  Patient is already on Eliquis, possibly due to history of atrial fibrillation, but patient does not recall.  We will continue to monitor patient on telemetry, continue anticoagulation. 4.  Mechanical fall.  Unstable gait.  Will consult PT/OT for further evaluation and treatment. 5.  Hyperlipidemia, on statin.  All the records are reviewed and case discussed with ED provider. Management plans discussed  with the patient, who is in agreement.  CODE STATUS: Full Advance Directive Documentation     Most Recent Value  Type of Advance Directive  Living will  Pre-existing out of facility DNR order (yellow form or pink MOST form)  -  "MOST" Form in Place?  -       TOTAL TIME TAKING CARE OF THIS PATIENT: 50 minutes.    Amelia Jo M.D on 03/29/2018 at 11:29 PM  Between 7am to 6pm - Pager - [phone]  After 6pm go to [email or web address] - password [email or web address] [facility] [place] [facility]  [phone]  CC: Primary care physician; Birdie Sons, MD

## 2018-03-30 ENCOUNTER — Other Ambulatory Visit: Payer: Self-pay

## 2018-03-30 ENCOUNTER — Inpatient Hospital Stay (HOSPITAL_COMMUNITY)
Admit: 2018-03-30 | Discharge: 2018-03-30 | Disposition: A | Discharge status: Home / Self Care | Payer: Medicare Other | Attending: Internal Medicine | Admitting: Internal Medicine

## 2018-03-30 DIAGNOSIS — R0602 Shortness of breath: Secondary | ICD-10-CM

## 2018-03-30 LAB — BASIC METABOLIC PANEL
Anion gap: 9 (ref 5–15)
BUN: 12 mg/dL (ref 8–23)
CO2: 24 mmol/L (ref 22–32)
Calcium: 8.4 mg/dL — ABNORMAL LOW (ref 8.9–10.3)
Chloride: 99 mmol/L (ref 98–111)
Creatinine, Ser: 0.49 mg/dL — ABNORMAL LOW (ref 0.61–1.24)
Glucose, Bld: 125 mg/dL — ABNORMAL HIGH (ref 70–99)
POTASSIUM: 3.3 mmol/L — AB (ref 3.5–5.1)
Sodium: 132 mmol/L — ABNORMAL LOW (ref 135–145)

## 2018-03-30 LAB — CBC
HCT: 36.6 % — ABNORMAL LOW (ref 39.0–52.0)
Hemoglobin: 12.6 g/dL — ABNORMAL LOW (ref 13.0–17.0)
MCH: 34.4 pg — ABNORMAL HIGH (ref 26.0–34.0)
MCHC: 34.4 g/dL (ref 30.0–36.0)
MCV: 100 fL (ref 80.0–100.0)
Platelets: 245 10*3/uL (ref 150–400)
RBC: 3.66 MIL/uL — ABNORMAL LOW (ref 4.22–5.81)
RDW: 12.4 % (ref 11.5–15.5)
WBC: 10.6 10*3/uL — ABNORMAL HIGH (ref 4.0–10.5)
nRBC: 0 % (ref 0.0–0.2)

## 2018-03-30 LAB — GLUCOSE, CAPILLARY: Glucose-Capillary: 111 mg/dL — ABNORMAL HIGH (ref 70–99)

## 2018-03-30 LAB — ECHOCARDIOGRAM COMPLETE
HEIGHTINCHES: 70 in
WEIGHTICAEL: 3005.31 [oz_av]

## 2018-03-30 MED ORDER — VITAMIN E 180 MG (400 UNIT) PO CAPS
400.0000 [IU] | ORAL_CAPSULE | Freq: Every day | ORAL | Status: DC
  Administered 2018-03-30 – 2018-03-31 (×2): 400 [IU] via ORAL
  Filled 2018-03-30 (×2): qty 1

## 2018-03-30 MED ORDER — FUROSEMIDE 10 MG/ML IJ SOLN
40.0000 mg | Freq: Two times a day (BID) | INTRAMUSCULAR | Status: DC
  Administered 2018-03-30 – 2018-03-31 (×2): 40 mg via INTRAVENOUS
  Filled 2018-03-30 (×2): qty 4

## 2018-03-30 MED ORDER — FUROSEMIDE 10 MG/ML IJ SOLN
20.0000 mg | Freq: Two times a day (BID) | INTRAMUSCULAR | Status: DC
  Administered 2018-03-30: 20 mg via INTRAVENOUS
  Filled 2018-03-30: qty 4

## 2018-03-30 MED ORDER — GUAIFENESIN ER 600 MG PO TB12
600.0000 mg | ORAL_TABLET | Freq: Two times a day (BID) | ORAL | Status: DC
  Administered 2018-03-30 – 2018-03-31 (×3): 600 mg via ORAL
  Filled 2018-03-30 (×4): qty 1

## 2018-03-30 MED ORDER — ACETAMINOPHEN 325 MG PO TABS
650.0000 mg | ORAL_TABLET | Freq: Four times a day (QID) | ORAL | Status: DC | PRN
  Administered 2018-03-30: 650 mg via ORAL
  Filled 2018-03-30: qty 2

## 2018-03-30 MED ORDER — BISACODYL 5 MG PO TBEC: 5.0000 mg | DELAYED_RELEASE_TABLET | Freq: Every day | ORAL | Status: DC | PRN

## 2018-03-30 MED ORDER — DM-GUAIFENESIN ER 30-600 MG PO TB12: 1.0000 | ORAL_TABLET | Freq: Two times a day (BID) | ORAL | Status: DC

## 2018-03-30 MED ORDER — ADULT MULTIVITAMIN W/MINERALS CH
ORAL_TABLET | Freq: Every day | ORAL | Status: DC
  Administered 2018-03-30 – 2018-03-31 (×2): 1 via ORAL
  Filled 2018-03-30 (×2): qty 1

## 2018-03-30 MED ORDER — ONDANSETRON HCL 4 MG/2ML IJ SOLN: 4.0000 mg | Freq: Four times a day (QID) | INTRAMUSCULAR | Status: DC | PRN

## 2018-03-30 MED ORDER — DOCUSATE SODIUM 100 MG PO CAPS
100.0000 mg | ORAL_CAPSULE | Freq: Two times a day (BID) | ORAL | Status: DC
  Administered 2018-03-30 – 2018-03-31 (×3): 100 mg via ORAL
  Filled 2018-03-30 (×3): qty 1

## 2018-03-30 MED ORDER — ATORVASTATIN CALCIUM 20 MG PO TABS
20.0000 mg | ORAL_TABLET | Freq: Every day | ORAL | Status: DC
  Administered 2018-03-30: 20 mg via ORAL
  Filled 2018-03-30: qty 1

## 2018-03-30 MED ORDER — ISOSORBIDE DINITRATE 30 MG PO TABS
30.0000 mg | ORAL_TABLET | Freq: Four times a day (QID) | ORAL | Status: DC
  Administered 2018-03-30: 30 mg via ORAL
  Filled 2018-03-30 (×4): qty 1

## 2018-03-30 MED ORDER — TAMSULOSIN HCL 0.4 MG PO CAPS
0.4000 mg | ORAL_CAPSULE | Freq: Every day | ORAL | Status: DC
  Administered 2018-03-30: 0.4 mg via ORAL
  Filled 2018-03-30: qty 1

## 2018-03-30 MED ORDER — FERROUS SULFATE 325 (65 FE) MG PO TABS
325.0000 mg | ORAL_TABLET | Freq: Every day | ORAL | Status: DC
  Administered 2018-03-30 – 2018-03-31 (×2): 325 mg via ORAL
  Filled 2018-03-30 (×2): qty 1

## 2018-03-30 MED ORDER — ACETAMINOPHEN 650 MG RE SUPP: 650.0000 mg | Freq: Four times a day (QID) | RECTAL | Status: DC | PRN

## 2018-03-30 MED ORDER — CALCIUM CARBONATE ANTACID 500 MG PO CHEW
200.0000 mg | CHEWABLE_TABLET | Freq: Every day | ORAL | Status: DC
  Administered 2018-03-30 – 2018-03-31 (×2): 200 mg via ORAL
  Filled 2018-03-30 (×2): qty 1

## 2018-03-30 MED ORDER — TRAZODONE HCL 50 MG PO TABS: 25.0000 mg | ORAL_TABLET | Freq: Every evening | ORAL | Status: DC | PRN

## 2018-03-30 MED ORDER — ASPIRIN EC 81 MG PO TBEC
81.0000 mg | DELAYED_RELEASE_TABLET | Freq: Every day | ORAL | Status: DC
  Administered 2018-03-30: 81 mg via ORAL
  Filled 2018-03-30 (×2): qty 1

## 2018-03-30 MED ORDER — ONDANSETRON HCL 4 MG PO TABS: 4.0000 mg | ORAL_TABLET | Freq: Four times a day (QID) | ORAL | Status: DC | PRN

## 2018-03-30 MED ORDER — DEXTROMETHORPHAN POLISTIREX ER 30 MG/5ML PO SUER
30.0000 mg | Freq: Two times a day (BID) | ORAL | Status: DC
  Administered 2018-03-30 – 2018-03-31 (×3): 30 mg via ORAL
  Filled 2018-03-30 (×4): qty 5

## 2018-03-30 MED ORDER — VITAMIN B-6 50 MG PO TABS
50.0000 mg | ORAL_TABLET | Freq: Every day | ORAL | Status: DC
  Administered 2018-03-30 – 2018-03-31 (×2): 50 mg via ORAL
  Filled 2018-03-30 (×2): qty 1

## 2018-03-30 MED ORDER — APIXABAN 5 MG PO TABS
5.0000 mg | ORAL_TABLET | Freq: Two times a day (BID) | ORAL | Status: DC
  Administered 2018-03-30 – 2018-03-31 (×3): 5 mg via ORAL
  Filled 2018-03-30 (×3): qty 1

## 2018-03-30 MED ORDER — POTASSIUM CHLORIDE CRYS ER 20 MEQ PO TBCR
40.0000 meq | EXTENDED_RELEASE_TABLET | Freq: Once | ORAL | Status: AC
  Administered 2018-03-30: 40 meq via ORAL
  Filled 2018-03-30: qty 2

## 2018-03-30 MED ORDER — HYDROCODONE-ACETAMINOPHEN 5-325 MG PO TABS
1.0000 | ORAL_TABLET | ORAL | Status: DC | PRN
  Administered 2018-03-30 – 2018-03-31 (×2): 1 via ORAL
  Filled 2018-03-30 (×2): qty 1

## 2018-03-30 MED ORDER — ALENDRONATE SODIUM 70 MG PO TABS: 70.0000 mg | ORAL_TABLET | ORAL | Status: DC

## 2018-03-30 NOTE — Evaluation (Signed)
Occupational Therapy Evaluation Patient Details Name: Douglas Calhoun MRN: [id number] DOB: 1919-11-18 Today's Date: 03/30/2018    History of Present Illness 84 y.o. male with a known history of hearing loss, cataracts, prostate cancer, status post radiation treatment, hypertension, hyperlipidemia, osteoporosis and other comorbidities.  He is admitted with acute CHF exacerbation, had a fall earlier this week.  He had decreased O2 sats on arrival, not on O2 at baseline.     Clinical Impression   Pt seen for OT evaluation this date. Prior to hospital admission, pt was independent with all ADLs and has daughter in law to assist PRN for IADL tasks when necessary. Pt experienced "huffing and puffing" when completing many ADL tasks.  Pt used RW for in home and community distances. Pt takes care of 26 y/o wife with dementia. Pt presented in bed and was eager for OT session. Currently pt on 2L of O2; Supine: 93%; EOB 91%; after exertion sitting EOB 88% (no O2); Supine 96% (with O2). Pt does not use O2 at home.  Currently pt demonstrates impairments in (see OT Problem List below)  requiring Supervision assist for LB/UB ADL tasks. Pt instructed in energy conservation and falls prevention strategies. Pt verbalized understanding of all education presented this date.  Pt would benefit from skilled OT to address noted impairments and functional limitations in order to maximize safety and independence while minimizing falls risk and caregiver burden.  Upon hospital discharge, recommend pt discharge to [facility].    Follow Up Recommendations  Home health OT    Equipment Recommendations  None recommended by OT    Recommendations for Other Services       Precautions / Restrictions Precautions Precautions: Fall Restrictions Weight Bearing Restrictions: No      Mobility Bed Mobility Overal bed mobility: Modified Independent             General bed mobility comments: Pt able to get supine<>sit w/o direct  assist;  extra effort and time  Transfers Overall transfer level: Modified independent Equipment used: Rolling walker (2 wheeled)             General transfer comment: Pt needed extra time/effort to stand; increased pain noted.     Balance Overall balance assessment: Modified Independent                                         ADL either performed or assessed with clinical judgement   ADL Overall ADL's : Needs assistance/impaired                         Toilet Transfer: Min guard;RW;Ambulation;Supervision/safety           Functional mobility during ADLs: Rolling walker;Min guard;Supervision/safety General ADL Comments: Pt gets SOB when completing his LB ADL tasks at baseline. Pt Supervision level for UB/LB ADL tasks.      Vision Baseline Vision/History: Wears glasses Wears Glasses: At all times Patient Visual Report: No change from baseline       Perception     Praxis      Pertinent Vitals/Pain Pain Assessment: 0-10 Pain Score: 9  Pain Location: pain level 0 supine; 9 when moving to stand  Pain Descriptors / Indicators: Sharp;Grimacing Pain Intervention(s): Limited activity within patient's tolerance;Monitored during session;Repositioned     Hand Dominance     Extremity/Trunk Assessment Upper Extremity Assessment Upper Extremity Assessment:  Overall WFL for tasks assessed;Generalized weakness(75% AROM shoulders)   Lower Extremity Assessment Lower Extremity Assessment: Overall WFL for tasks assessed;Generalized weakness       Communication Communication Communication: HOH   Cognition Arousal/Alertness: Awake/alert Behavior During Therapy: WFL for tasks assessed/performed Overall Cognitive Status: Within Functional Limits for tasks assessed                                     General Comments  on 2L of O2; Supine: 93%; EOB 91%; after exertion sitting EOB 88% (no O2); Supine 96% (with O2)    Exercises  Other Exercises Other Exercises: Pt educated in energy conservation strategies including pacing, prioritizing, and completing tasks while sitting. Other Exercises: Pt educated in falls prevention strategies when completing ADL tasks.    Shoulder Instructions      Home Living Family/patient expects to be discharged to:: Private residence Living Arrangements: Spouse/significant other Available Help at Discharge: Family;Available PRN/intermittently(daughter in law few miles away: PRN) Type of Home: Independent living facility       Home Layout: One level     Bathroom Shower/Tub: Occupational psychologist: Handicapped height     Home Equipment: Environmental consultant - 2 wheels;Walker - 4 wheels          Prior Functioning/Environment Level of Independence: Independent with assistive device(s)        Comments: Pt uses two wheeled RW for household distances and 4 wheeled walker for community distances. Pt drives short distance to grocery store. Enjoys watching TV        OT Problem List: Decreased strength;Decreased range of motion;Pain;Impaired balance (sitting and/or standing);Decreased knowledge of use of DME or AE;Decreased activity tolerance      OT Treatment/Interventions: Self-care/ADL training;DME and/or AE instruction;Therapeutic activities;Balance training;Therapeutic exercise;Energy conservation;Patient/family education    OT Goals(Current goals can be found in the care plan section) Acute Rehab OT Goals Patient Stated Goal: go home OT Goal Formulation: With patient Time For Goal Achievement: 04/13/18 Potential to Achieve Goals: Good ADL Goals Pt Will Perform Lower Body Dressing: with modified independence;sit to/from stand Additional ADL Goal #1: Pt will independently utilize at least 3 energy conservation strategies when completing an ADL task.  OT Frequency: Min 1X/week   Barriers to D/C:            Co-evaluation              AM-PAC OT "6 Clicks" Daily  Activity     Outcome Measure Help from another person eating meals?: None Help from another person taking care of personal grooming?: None Help from another person toileting, which includes using toliet, bedpan, or urinal?: None Help from another person bathing (including washing, rinsing, drying)?: A Little Help from another person to put on and taking off regular upper body clothing?: None Help from another person to put on and taking off regular lower body clothing?: A Little 6 Click Score: 22   End of Session Equipment Utilized During Treatment: Gait belt;Rolling walker  Activity Tolerance: Patient tolerated treatment well Patient left: in bed;with call bell/phone within reach;with bed alarm set  OT Visit Diagnosis: Other abnormalities of gait and mobility (R26.89);Pain Pain - Right/Left: Left Pain - part of body: (back)                Time: 2440-1027 OT Time Calculation (min): 39 min Charges:     Jadene Pierini OTS  03/30/2018, 4:21  PM

## 2018-03-30 NOTE — Consult Note (Signed)
[facility] Consultation Note  Patient ID: Douglas Calhoun, MRN: [id number], DOB/AGE: 1919-08-14 84 y.o. Admit date: 03/29/2018   Date of Consult: 03/30/2018 Primary Physician: Birdie Sons, MD Primary Cardiologist: None  Chief Complaint:  Chief Complaint  Patient presents with  . Shortness of Breath   Reason for Consult: Shortness of breath atrial fibrillation  HPI: 84 y.o. male with known chronic nonvalvular atrial fibrillation essential hypertension and acute onset of severe shortness of breath after a fall.  The patient appears to not had any episode of syncope although his history is slightly difficult.  The patient has had no major injury although says his side hurts a little bit.  The patient has had an EKG showing atrial fibrillation with controlled ventricular rate and PVCs with a troponin of 0.03 and a chest x-ray consistent with mild pulmonary edema.  Therefore it is possible that atrial fibrillation and/or pulmonary edema have contributed to his fall.  The patient currently is much better at this time breathing better and hemodynamically stable  Past Medical History:  Diagnosis Date  . Achilles tendinitis   . Cataract   . Hearing loss   . Herpes zoster without complication 08/02/8117  . Personal history of prostate cancer 10/08/2014   s/p 15 radiation treatments treated by Dr. Madelin Headings       Surgical History:  Past Surgical History:  Procedure Laterality Date  . APPENDECTOMY  1960's  . BREAST SURGERY     Breast Biopsy prior to 1960  . CATARACT EXTRACTION Left      Home Meds: Prior to Admission medications   Medication Sig Start Date End Date Taking? Authorizing Provider  HYDROcodone-acetaminophen (NORCO/VICODIN) 5-325 MG tablet Take 1 tablet by mouth 2 (two) times daily.   Yes [provider]  alendronate (FOSAMAX) 70 MG tablet Take 70 mg by mouth once a week. Take with a full glass of water on an empty stomach on Saturdays.    [provider]  apixaban (ELIQUIS) 5 MG TABS tablet Take 5 mg by mouth 2 (two) times daily.    [provider]  aspirin EC 81 MG tablet Take by mouth.    [provider]  atorvastatin (LIPITOR) 20 MG tablet Take 20 mg by mouth daily. 1/2 a pill at bedtime    [provider]  Calcium 500 MG tablet Take 1 tablet by mouth daily. 07/17/08   [provider]  camphor-menthol Timoteo Ace) lotion Apply 1 application topically as needed for itching.    [provider]  Cholecalciferol (VITAMIN D3) 1000 units CAPS Take by mouth.    [provider]  Cyanocobalamin 1000 MCG CAPS Take by mouth.    [provider]  ferrous sulfate 325 (65 FE) MG tablet Take 325 mg by mouth daily with breakfast.    [provider]  furosemide (LASIX) 20 MG tablet Take 20 mg by mouth.    [provider]  isosorbide dinitrate (ISORDIL) 30 MG tablet Take 30 mg by mouth 4 (four) times daily.    [provider]  Multiple Vitamins-Minerals (MULTIVITAMIN ADULT PO) Take 1 tablet by mouth daily. 07/17/08   [provider]  Prenatal Vit-Fe Fumarate-FA (PRENATAL PO) Take by mouth.    [provider]  pyridOXINE (B-6) 50 MG tablet Take 50 mg by mouth daily.    [provider]  Skin Protectants, Misc. (EUCERIN) cream Apply topically as needed for dry skin.    [provider]  tamsulosin [facility])  0.4 MG CAPS capsule Take 0.4 mg by mouth daily after supper.    [provider]  triamcinolone ointment (KENALOG) 0.1 % Apply 1 application topically 2 (two) times daily.    [provider]  Vitamin E 400 UNITS TABS Take 1 tablet by mouth daily. 07/17/08   [provider]    Inpatient Medications:  . apixaban  5 mg Oral BID  . aspirin EC  81 mg Oral Daily  . atorvastatin  20 mg Oral Daily  . calcium carbonate  200 mg of elemental calcium Oral Daily  . dextromethorphan  30 mg Oral BID   And  .  guaiFENesin  600 mg Oral BID  . docusate sodium  100 mg Oral BID  . ferrous sulfate  325 mg Oral Q breakfast  . furosemide  40 mg Intravenous BID  . isosorbide dinitrate  30 mg Oral QID  . multivitamin with minerals   Oral Daily  . pyridOXINE  50 mg Oral Daily  . tamsulosin  0.4 mg Oral QPC supper  . vitamin E  400 Units Oral Daily     Allergies: No Known Allergies  Social History   Socioeconomic History  . Marital status: Married    Spouse name: Not on file  . Number of children: Not on file  . Years of education: Not on file  . Highest education level: Not on file  Occupational History  . Occupation: retired  Scientific laboratory technician  . Financial resource strain: Not on file  . Food insecurity:    Worry: Not on file    Inability: Not on file  . Transportation needs:    Medical: Not on file    Non-medical: Not on file  Tobacco Use  . Smoking status: Never Smoker  . Smokeless tobacco: Never Used  Substance and Sexual Activity  . Alcohol use: Yes    Alcohol/week: 0.0 standard drinks    Comment: drinks 2 beers a month  . Drug use: No  . Sexual activity: Not on file  Lifestyle  . Physical activity:    Days per week: Not on file    Minutes per session: Not on file  . Stress: Not on file  Relationships  . Social connections:    Talks on phone: Not on file    Gets together: Not on file    Attends religious service: Not on file    Active member of club or organization: Not on file    Attends meetings of clubs or organizations: Not on file    Relationship status: Not on file  . Intimate partner violence:    Fear of current or ex partner: Not on file    Emotionally abused: Not on file    Physically abused: Not on file    Forced sexual activity: Not on file  Other Topics Concern  . Not on file  Social History Narrative   Lives along at [place] History  Problem Relation Age of Onset  . Heart disease Mother   . Stroke Mother      Review of Systems Positive for  as of breath and fall Negative for: General:  chills, fever, night sweats or weight changes.  Cardiovascular: PND orthopnea syncope dizziness  Dermatological skin lesions rashes Respiratory: Cough congestion Urologic: Frequent urination urination at night and hematuria Abdominal: negative for nausea, vomiting, diarrhea, bright red blood per rectum, melena, or hematemesis Neurologic: negative for visual changes, and/or hearing changes  All other systems  reviewed and are otherwise negative except as noted above.  Labs: Recent Labs    03/29/18 1741 03/29/18 1950  TROPONINI <0.03 <0.03   Lab Results  Component Value Date   WBC 10.6 (H) 03/30/2018   HGB 12.6 (L) 03/30/2018   HCT 36.6 (L) 03/30/2018   MCV 100.0 03/30/2018   PLT 245 03/30/2018    Recent Labs  Lab 03/30/18 0448  NA 132*  K 3.3*  CL 99  CO2 24  BUN 12  CREATININE 0.49*  CALCIUM 8.4*  GLUCOSE 125*   Lab Results  Component Value Date   CHOL 231 (A) 03/06/2013   HDL 78 (A) 03/06/2013   LDLCALC 133 03/06/2013   TRIG 98 03/06/2013   No results found for: DDIMER  Radiology/Studies:  Dg Chest 2 View  Result Date: 03/29/2018 CLINICAL DATA:  Shortness of breath and left rib pain. EXAM: CHEST - 2 VIEW COMPARISON:  04/07/2015 FINDINGS: Enlarged cardiac silhouette. Calcific atherosclerotic disease of the aorta. Mediastinal contours appear intact. There is no evidence of focal airspace consolidation, or pneumothorax. Mild coarsening of the interstitial markings. Bibasilar atelectasis. Left lateral pleural thickening, nonspecific. Healed right-sided rib fractures.  Soft tissues are grossly normal. IMPRESSION: Mild interstitial pulmonary edema. Enlarged cardiac silhouette. No evidence of lobar consolidation. Electronically Signed   By: Fidela Salisbury M.D.   On: 03/29/2018 18:42   Dg Thoracic Spine 2 View  Result Date: 03/29/2018 CLINICAL DATA:  Fall a few days ago.  Upper back pain. EXAM: THORACIC SPINE 2 VIEWS  COMPARISON:  Chest x-rays since November 19, 2014 FINDINGS: Anterior wedging of a lower thoracic or upper lumbar vertebral body, unchanged since 2016. Degenerative changes, most marked did in the upper lumbar spine. T1 and T2 were best evaluated on the C-spine CT from today without fracture. IMPRESSION: No acute fracture or traumatic malalignment.  Degenerative changes. Electronically Signed   By: Dorise Bullion III M.D   On: 03/29/2018 19:17   Ct Head Wo Contrast  Result Date: 03/29/2018 CLINICAL DATA:  Fall a few days ago.  Pain. EXAM: CT HEAD WITHOUT CONTRAST CT CERVICAL SPINE WITHOUT CONTRAST TECHNIQUE: Multidetector CT imaging of the head and cervical spine was performed following the standard protocol without intravenous contrast. Multiplanar CT image reconstructions of the cervical spine were also generated. COMPARISON:  None. FINDINGS: CT HEAD FINDINGS Brain: No subdural, epidural, or subarachnoid hemorrhage. White matter changes are noted, chronic in appearance. No acute cortical ischemia infarct. Cerebellum, brainstem, and basal cisterns are normal. Ventricles and sulci are unremarkable. No mass effect or midline shift. Vascular: No hyperdense vessel or unexpected calcification. Skull: Normal. Negative for fracture or focal lesion. Sinuses/Orbits: No acute finding. Other: None. CT CERVICAL SPINE FINDINGS Alignment: There is 4 mm of anterolisthesis of C5 versus C6. No other malalignment. Skull base and vertebrae: No acute fracture. No primary bone lesion or focal pathologic process. Soft tissues and spinal canal: No prevertebral fluid or swelling. No visible canal hematoma. Disc levels: Multilevel degenerative disc disease and facet degenerative changes. Upper chest: Negative. Other: No other abnormalities. IMPRESSION: 1. No acute intracranial abnormalities. 2. 4 mm of anterolisthesis of C5 versus C6 is favored to be degenerative. No fractures noted. No traumatic malalignment. Electronically Signed   By:  Dorise Bullion III M.D   On: 03/29/2018 19:15   Ct Cervical Spine Wo Contrast  Result Date: 03/29/2018 CLINICAL DATA:  Fall a few days ago.  Pain. EXAM: CT HEAD WITHOUT CONTRAST CT CERVICAL SPINE WITHOUT CONTRAST TECHNIQUE: Multidetector CT  imaging of the head and cervical spine was performed following the standard protocol without intravenous contrast. Multiplanar CT image reconstructions of the cervical spine were also generated. COMPARISON:  None. FINDINGS: CT HEAD FINDINGS Brain: No subdural, epidural, or subarachnoid hemorrhage. White matter changes are noted, chronic in appearance. No acute cortical ischemia infarct. Cerebellum, brainstem, and basal cisterns are normal. Ventricles and sulci are unremarkable. No mass effect or midline shift. Vascular: No hyperdense vessel or unexpected calcification. Skull: Normal. Negative for fracture or focal lesion. Sinuses/Orbits: No acute finding. Other: None. CT CERVICAL SPINE FINDINGS Alignment: There is 4 mm of anterolisthesis of C5 versus C6. No other malalignment. Skull base and vertebrae: No acute fracture. No primary bone lesion or focal pathologic process. Soft tissues and spinal canal: No prevertebral fluid or swelling. No visible canal hematoma. Disc levels: Multilevel degenerative disc disease and facet degenerative changes. Upper chest: Negative. Other: No other abnormalities. IMPRESSION: 1. No acute intracranial abnormalities. 2. 4 mm of anterolisthesis of C5 versus C6 is favored to be degenerative. No fractures noted. No traumatic malalignment. Electronically Signed   By: Dorise Bullion III M.D   On: 03/29/2018 19:15    EKG: Atrial fibrillation with controlled ventricular rate and preventricular contractions  Weights: Filed Weights   03/30/18 0113  Weight: 85.2 kg     Physical Exam: Blood pressure 119/71, pulse 79, temperature 98 F (36.7 C), temperature source Oral, resp. rate 19, height 5\' 10"  (1.778 m), weight 85.2 kg, SpO2 98 %. Body  mass index is 26.95 kg/m. General: Well developed, well nourished, in no acute distress. Head eyes ears nose throat: Normocephalic, atraumatic, sclera non-icteric, no xanthomas, nares are without discharge. No apparent thyromegaly and/or mass  Lungs: Normal respiratory effort.  no wheezes, few basilar rales, no rhonchi.  Heart: Other with normal S1 S2. no murmur gallop, no rub, PMI is normal size and placement, carotid upstroke normal without bruit, jugular venous pressure is normal Abdomen: Soft, non-tender, non-distended with normoactive bowel sounds. No hepatomegaly. No rebound/guarding. No obvious abdominal masses. Abdominal aorta is normal size without bruit Extremities: Trace edema. no cyanosis, no clubbing, no ulcers  Peripheral : 2+ bilateral upper extremity pulses, 2+ bilateral femoral pulses, 2+ bilateral dorsal pedal pulse Neuro: Alert and oriented. No facial asymmetry. No focal deficit. Moves all extremities spontaneously. Musculoskeletal: Normal muscle tone without kyphosis Psych:  Responds to questions appropriately with a normal affect.    Assessment: 84 year old male with mechanical fall without evidence of true syncope having chronic nonvalvular atrial fibrillation with controlled heart rate mild elevation of troponin consistent with demand ischemia and mild pulmonary edema consistent with acute systolic dysfunction heart failure  Plan: 1.  Continue furosemide for pulmonary edema shortness of breath and heart failure 2.  Echocardiogram pending at this time for further evaluation of the cause of congestive heart failure 3.  Sideration of discontinuation of isosorbide due to the fact the patient has not reported any chest pain and possibly could result in dizziness syncope and/or falls 4.  Continue Eliquis for further risk reduction in stroke with atrial fibrillation but discontinuation of aspirin due to concerns of bleeding complication and no primary cause or need for aspirin at  this time 5.  High intensity cholesterol therapy for risk reduction cardiovascular event 6.  Begin ambulation and follow for improvements of symptoms and possible discharge home when able for further adjustments of medications and treatment options as an outpatient  Signed, Corey Skains M.D. [facility] 03/30/2018, 12:59 PM

## 2018-03-30 NOTE — Progress Notes (Signed)
[place] [facility] [place] NAME: Douglas Calhoun    MR#:  [id number]  DATE OF BIRTH:  14-Dec-1919  SUBJECTIVE:  CHIEF COMPLAINT:   Chief Complaint  Patient presents with  . Shortness of Breath  Patient seen and evaluated today He is on oxygen via nasal cannula Has shortness of breath on ambulation Occasional cough No chest pain  REVIEW OF SYSTEMS:    ROS  CONSTITUTIONAL: No documented fever. No fatigue, weakness. No weight gain, no weight loss.  EYES: No blurry or double vision.  ENT: No tinnitus. No postnasal drip. No redness of the oropharynx.  RESPIRATORY: occasional  cough, no wheeze, no hemoptysis. Has dyspnea.  CARDIOVASCULAR: No chest pain. No orthopnea. No palpitations. No syncope.  GASTROINTESTINAL: No nausea, no vomiting or diarrhea. No abdominal pain. No melena or hematochezia.  GENITOURINARY: No dysuria or hematuria.  ENDOCRINE: No polyuria or nocturia. No heat or cold intolerance.  HEMATOLOGY: No anemia. No bruising. No bleeding.  INTEGUMENTARY: No rashes. No lesions.  MUSCULOSKELETAL: No arthritis. No swelling. No gout.  NEUROLOGIC: No numbness, tingling, or ataxia. No seizure-type activity.  PSYCHIATRIC: No anxiety. No insomnia. No ADD.   DRUG ALLERGIES:  No Known Allergies  VITALS:  Blood pressure 119/71, pulse 79, temperature 98 F (36.7 C), temperature source Oral, resp. rate 19, height 5\' 10"  (1.778 m), weight 85.2 kg, SpO2 98 %.  PHYSICAL EXAMINATION:   Physical Exam  GENERAL:  84 y.o.-year-old patient lying in the bed with no acute distress.  EYES: Pupils equal, round, reactive to light and accommodation. No scleral icterus. Extraocular muscles intact.  HEENT: Head atraumatic, normocephalic. Oropharynx and nasopharynx clear.  NECK:  Supple, no jugular venous distention. No thyroid enlargement, no tenderness.  LUNGS: Decreased breath sounds bilaterally, bibasilar crepitations. No use of accessory muscles of  respiration.  CARDIOVASCULAR: S1, S2 normal. No murmurs, rubs, or gallops.  ABDOMEN: Soft, nontender, nondistended. Bowel sounds present. No organomegaly or mass.  EXTREMITIES: No cyanosis, clubbing or edema b/l.    NEUROLOGIC: Cranial nerves II through XII are intact. No focal Motor or sensory deficits b/l.   PSYCHIATRIC: The patient is alert and oriented x 3.  SKIN: No obvious rash, lesion, or ulcer.   LABORATORY PANEL:   CBC Recent Labs  Lab 03/30/18 0448  WBC 10.6*  HGB 12.6*  HCT 36.6*  PLT 245   ------------------------------------------------------------------------------------------------------------------ Chemistries  Recent Labs  Lab 03/30/18 0448  NA 132*  K 3.3*  CL 99  CO2 24  GLUCOSE 125*  BUN 12  CREATININE 0.49*  CALCIUM 8.4*   ------------------------------------------------------------------------------------------------------------------  Cardiac Enzymes Recent Labs  Lab 03/29/18 1950  TROPONINI <0.03   ------------------------------------------------------------------------------------------------------------------  RADIOLOGY:  Dg Chest 2 View  Result Date: 03/29/2018 CLINICAL DATA:  Shortness of breath and left rib pain. EXAM: CHEST - 2 VIEW COMPARISON:  04/07/2015 FINDINGS: Enlarged cardiac silhouette. Calcific atherosclerotic disease of the aorta. Mediastinal contours appear intact. There is no evidence of focal airspace consolidation, or pneumothorax. Mild coarsening of the interstitial markings. Bibasilar atelectasis. Left lateral pleural thickening, nonspecific. Healed right-sided rib fractures.  Soft tissues are grossly normal. IMPRESSION: Mild interstitial pulmonary edema. Enlarged cardiac silhouette. No evidence of lobar consolidation. Electronically Signed   By: Fidela Salisbury M.D.   On: 03/29/2018 18:42   Dg Thoracic Spine 2 View  Result Date: 03/29/2018 CLINICAL DATA:  Fall a few days ago.  Upper back pain. EXAM: THORACIC SPINE 2  VIEWS COMPARISON:  Chest x-rays since November 19, 2014 FINDINGS: Anterior wedging of a lower thoracic or upper lumbar vertebral body, unchanged since 2016. Degenerative changes, most marked did in the upper lumbar spine. T1 and T2 were best evaluated on the C-spine CT from today without fracture. IMPRESSION: No acute fracture or traumatic malalignment.  Degenerative changes. Electronically Signed   By: Dorise Bullion III M.D   On: 03/29/2018 19:17   Ct Head Wo Contrast  Result Date: 03/29/2018 CLINICAL DATA:  Fall a few days ago.  Pain. EXAM: CT HEAD WITHOUT CONTRAST CT CERVICAL SPINE WITHOUT CONTRAST TECHNIQUE: Multidetector CT imaging of the head and cervical spine was performed following the standard protocol without intravenous contrast. Multiplanar CT image reconstructions of the cervical spine were also generated. COMPARISON:  None. FINDINGS: CT HEAD FINDINGS Brain: No subdural, epidural, or subarachnoid hemorrhage. White matter changes are noted, chronic in appearance. No acute cortical ischemia infarct. Cerebellum, brainstem, and basal cisterns are normal. Ventricles and sulci are unremarkable. No mass effect or midline shift. Vascular: No hyperdense vessel or unexpected calcification. Skull: Normal. Negative for fracture or focal lesion. Sinuses/Orbits: No acute finding. Other: None. CT CERVICAL SPINE FINDINGS Alignment: There is 4 mm of anterolisthesis of C5 versus C6. No other malalignment. Skull base and vertebrae: No acute fracture. No primary bone lesion or focal pathologic process. Soft tissues and spinal canal: No prevertebral fluid or swelling. No visible canal hematoma. Disc levels: Multilevel degenerative disc disease and facet degenerative changes. Upper chest: Negative. Other: No other abnormalities. IMPRESSION: 1. No acute intracranial abnormalities. 2. 4 mm of anterolisthesis of C5 versus C6 is favored to be degenerative. No fractures noted. No traumatic malalignment. Electronically Signed    By: Dorise Bullion III M.D   On: 03/29/2018 19:15   Ct Cervical Spine Wo Contrast  Result Date: 03/29/2018 CLINICAL DATA:  Fall a few days ago.  Pain. EXAM: CT HEAD WITHOUT CONTRAST CT CERVICAL SPINE WITHOUT CONTRAST TECHNIQUE: Multidetector CT imaging of the head and cervical spine was performed following the standard protocol without intravenous contrast. Multiplanar CT image reconstructions of the cervical spine were also generated. COMPARISON:  None. FINDINGS: CT HEAD FINDINGS Brain: No subdural, epidural, or subarachnoid hemorrhage. White matter changes are noted, chronic in appearance. No acute cortical ischemia infarct. Cerebellum, brainstem, and basal cisterns are normal. Ventricles and sulci are unremarkable. No mass effect or midline shift. Vascular: No hyperdense vessel or unexpected calcification. Skull: Normal. Negative for fracture or focal lesion. Sinuses/Orbits: No acute finding. Other: None. CT CERVICAL SPINE FINDINGS Alignment: There is 4 mm of anterolisthesis of C5 versus C6. No other malalignment. Skull base and vertebrae: No acute fracture. No primary bone lesion or focal pathologic process. Soft tissues and spinal canal: No prevertebral fluid or swelling. No visible canal hematoma. Disc levels: Multilevel degenerative disc disease and facet degenerative changes. Upper chest: Negative. Other: No other abnormalities. IMPRESSION: 1. No acute intracranial abnormalities. 2. 4 mm of anterolisthesis of C5 versus C6 is favored to be degenerative. No fractures noted. No traumatic malalignment. Electronically Signed   By: Dorise Bullion III M.D   On: 03/29/2018 19:15     ASSESSMENT AND PLAN:   84 year old male patient with history of nonvalvular atrial fibrillation, hypertension, hyperlipidemia, prostate cancer currently under hospitalist service for dyspnea  -Acute respiratory distress secondary pulmonary edema Continue diuresis with IV Lasix  -Acute systolic heart failure  exacerbation Diuresis with IV Lasix 40 MDQ 12 hourly Monitor electrolytes  Status post cardiology evaluation Follow-up echocardiogram Discontinue nitrates as per cardiology to avoid  syncope and falls  -Chronic atrial fibrillation On Eliquis for anticoagulation  -Hyperlipidemia Continue statin medication   All the records are reviewed and case discussed with Care Management/Social Worker. Management plans discussed with the patient, family and they are in agreement.  CODE STATUS: Full code  DVT Prophylaxis: SCDs  TOTAL TIME TAKING CARE OF THIS PATIENT: 35 minutes.   POSSIBLE D/C IN 1 to 2 DAYS, DEPENDING ON CLINICAL CONDITION.  Saundra Shelling M.D on 03/30/2018 at 2:11 PM  Between 7am to 6pm - Pager - [phone]  After 6pm go to [email or web address] - password [email or web address] [facility]  [facility]  Office  [phone]  CC: Primary care physician; Birdie Sons, MD  Note: This dictation was prepared with Dragon dictation along with smaller phrase technology. Any transcriptional errors that result from this process are unintentional.

## 2018-03-30 NOTE — Progress Notes (Signed)
*  PRELIMINARY RESULTS* Echocardiogram 2D Echocardiogram has been performed.  Douglas Calhoun 03/30/2018, 10:54 AM

## 2018-03-30 NOTE — Evaluation (Signed)
Physical Therapy Evaluation Patient Details Name: Douglas Calhoun MRN: [id number] DOB: 09/24/1919 Today's Date: 03/30/2018   History of Present Illness  84 y.o. male with a known history of hearing loss, cataracts, prostate cancer, status post radiation treatment, hypertension, hyperlipidemia, osteoporosis and other comorbidities.  He is admitted with acute CHF exacerbation, had a fall earlier this week.  He had decreased O2 sats on arrival, not on O2 at baseline.    Clinical Impression  Pt did relatively well with bed mobility, transfers and ambulation.  He is likely close to his baseline regarding these tasks, however he normally does not need O2 and is able to be more active than his tolerance allowed today. He was on 2 liters on arrival with sats in the mid/low 90s, stayed in the low 90s with minimal activity on room air, however during ambulate on room air he had some shortness of breath and sats dropped as low as 85% with modest distance.  He showed good effort and should be able to return to [facility] with HHPT once medically stable and O2 situation is rectified.    Follow Up Recommendations Home health PT    Equipment Recommendations  None recommended by PT    Recommendations for Other Services       Precautions / Restrictions Precautions Precautions: Fall Restrictions Weight Bearing Restrictions: No      Mobility  Bed Mobility Overal bed mobility: Modified Independent             General bed mobility comments: Pt able to get supine<>sit w/o direct assist. Some extra effort and time, but good confidence that he would be able to perform.  Transfers Overall transfer level: Independent Equipment used: Rolling walker (2 wheeled)             General transfer comment: Even from low bed setting pt was able to rise w/o direct assist.  He needed some extra effort and time, but no direct assist.   Ambulation/Gait Ambulation/Gait assistance: Modified independent  (Device/Increase time) Gait Distance (Feet): 100 Feet Assistive device: Rolling walker (2 wheeled)       General Gait Details: Pt with forward stooped posture and reliant on walker to ease back pain but no LOBs or overt safety issues.  He did wish to walk w/o O2 and his sats dropped from mid/low 90s to mid 80s with the effort, noted shortness of breath as well.   Stairs            Wheelchair Mobility    Modified Rankin (Stroke Patients Only)       Balance Overall balance assessment: Modified Independent                                           Pertinent Vitals/Pain Pain Assessment: (baseline chronic low back pain, especially in standing/WBing)    Home Living Family/patient expects to be discharged to:: Private residence Living Arrangements: Spouse/significant other   Type of Home: Independent living facility       Home Layout: One level Home Equipment: Environmental consultant - 2 wheels;Walker - 4 wheels      Prior Function Level of Independence: Independent with assistive device(s)         Comments: Pt reports that he still drives regularly to the grocery store, walks to dining area QD, helps wife with meds     Hand Dominance  Extremity/Trunk Assessment   Upper Extremity Assessment Upper Extremity Assessment: Generalized weakness;Overall [facility] for tasks assessed(age appropriate deficits)    Lower Extremity Assessment Lower Extremity Assessment: Generalized weakness;Overall WFL for tasks assessed(age appropriate deficits)       Communication   Communication: HOH  Cognition Arousal/Alertness: Awake/alert Behavior During Therapy: WFL for tasks assessed/performed Overall Cognitive Status: Within Functional Limits for tasks assessed                                        General Comments General comments (skin integrity, edema, etc.): on 2 liters O2 pt's sats were 93-95% at rest, on room air sats remained <90% with light in  bed/sitting activities, dropped to mid 80s on room air with ambulation.     Exercises     Assessment/Plan    PT Assessment Patient needs continued PT services  PT Problem List Decreased strength;Decreased range of motion;Decreased balance;Decreased activity tolerance;Decreased mobility;Decreased coordination;Decreased knowledge of use of DME;Decreased safety awareness;Cardiopulmonary status limiting activity;Pain       PT Treatment Interventions DME instruction;Gait training;Functional mobility training;Therapeutic activities;Therapeutic exercise;Balance training;Neuromuscular re-education;Patient/family education    PT Goals (Current goals can be found in the Care Plan section)  Acute Rehab PT Goals Patient Stated Goal: go home PT Goal Formulation: With patient Time For Goal Achievement: 04/13/18 Potential to Achieve Goals: Good    Frequency Min 2X/week   Barriers to discharge        Co-evaluation               AM-PAC PT "6 Clicks" Mobility  Outcome Measure Help needed turning from your back to your side while in a flat bed without using bedrails?: None Help needed moving from lying on your back to sitting on the side of a flat bed without using bedrails?: None Help needed moving to and from a bed to a chair (including a wheelchair)?: None Help needed standing up from a chair using your arms (e.g., wheelchair or bedside chair)?: None Help needed to walk in hospital room?: A Little Help needed climbing 3-5 steps with a railing? : A Little 6 Click Score: 22    End of Session Equipment Utilized During Treatment: Gait belt Activity Tolerance: Patient tolerated treatment well;Patient limited by fatigue Patient left: with bed alarm set;with call bell/phone within reach Nurse Communication: Mobility status(drop in O2 on room air) PT Visit Diagnosis: Muscle weakness (generalized) (M62.81);Difficulty in walking, not elsewhere classified (R26.2)    Time: 2924-4628 PT Time  Calculation (min) (ACUTE ONLY): 27 min   Charges:   PT Evaluation $PT Eval Low Complexity: 1 Low          Kreg Shropshire, DPT 03/30/2018, 9:30 AM

## 2018-03-31 LAB — BASIC METABOLIC PANEL
Anion gap: 6 (ref 5–15)
BUN: 18 mg/dL (ref 8–23)
CALCIUM: 8.3 mg/dL — AB (ref 8.9–10.3)
CO2: 27 mmol/L (ref 22–32)
Chloride: 100 mmol/L (ref 98–111)
Creatinine, Ser: 0.68 mg/dL (ref 0.61–1.24)
GFR calc Af Amer: >60 mL/min (ref >60)
GFR calc non Af Amer: >60 mL/min (ref >60)
Glucose, Bld: 113 mg/dL — ABNORMAL HIGH (ref 70–99)
Potassium: 3.4 mmol/L — ABNORMAL LOW (ref 3.5–5.1)
Sodium: 133 mmol/L — ABNORMAL LOW (ref 135–145)

## 2018-03-31 LAB — GLUCOSE, CAPILLARY: Glucose-Capillary: 105 mg/dL — ABNORMAL HIGH (ref 70–99)

## 2018-03-31 MED ORDER — HYDROCODONE-ACETAMINOPHEN 5-325 MG PO TABS: 1.0000 | ORAL_TABLET | Freq: Two times a day (BID) | ORAL | 0 refills | Status: DC

## 2018-03-31 MED ORDER — POTASSIUM CHLORIDE ER 10 MEQ PO TBCR: 10.0000 meq | EXTENDED_RELEASE_TABLET | Freq: Every day | ORAL | 0 refills | Status: DC

## 2018-03-31 MED ORDER — ISOSORBIDE DINITRATE 30 MG PO TABS: 30.0000 mg | ORAL_TABLET | Freq: Two times a day (BID) | ORAL | 0 refills | Status: DC

## 2018-03-31 MED ORDER — POTASSIUM CHLORIDE CRYS ER 20 MEQ PO TBCR
40.0000 meq | EXTENDED_RELEASE_TABLET | Freq: Once | ORAL | Status: AC
  Administered 2018-03-31: 40 meq via ORAL
  Filled 2018-03-31: qty 2

## 2018-03-31 NOTE — Progress Notes (Signed)
[facility] Encounter Note  Patient: Douglas Calhoun / Admit Date: 03/29/2018 / Date of Encounter: 03/31/2018, 7:58 AM   Subjective: Patients with improvements from the cardiovascular standpoint since yesterday with continued heart rate control of atrial fibrillation with and without ambulation.  Minimal elevation of troponin more consistent with demand ischemia rather than acute coronary syndrome.  No bleeding complications with anticoagulation despite a recent fall Echocardiogram with mild LV systolic dysfunction most consistent with decreased ejection fraction from atrial fibrillation rather than myocardial infarction or cardiomyopathy with ejection fraction of 45%  Review of Systems: Positive for: Shortness of breath weakness Negative for: Vision change, hearing change, syncope, dizziness, nausea, vomiting,diarrhea, bloody stool, stomach pain, cough, congestion, diaphoresis, urinary frequency, urinary pain,skin lesions, skin rashes Others previously listed  Objective: Telemetry: Atrial fibrillation with controlled ventricular rate Physical Exam: Blood pressure 122/66, pulse 74, temperature 97.6 F (36.4 C), temperature source Oral, resp. rate 20, height 5\' 10"  (1.778 m), weight 83.7 kg, SpO2 91 %. Body mass index is 26.48 kg/m. General: Well developed, well nourished, in no acute distress. Head: Normocephalic, atraumatic, sclera non-icteric, no xanthomas, nares are without discharge. Neck: No apparent masses Lungs: Normal respirations with no wheezes, few rhonchi, no rales , no crackles   Heart: Irregular rate and rhythm, normal S1 S2, no murmur, no rub, no gallop, PMI is normal size and placement, carotid upstroke normal without bruit, jugular venous pressure normal Abdomen: Soft, non-tender, non-distended with normoactive bowel sounds. No hepatosplenomegaly. Abdominal aorta is normal size without bruit Extremities: Trace edema, no clubbing, no cyanosis, no ulcers,   Peripheral: 2+ radial, 2+ femoral, 2+ dorsal pedal pulses Neuro: Alert and oriented. Moves all extremities spontaneously. Psych:  Responds to questions appropriately with a normal affect.   Intake/Output Summary (Last 24 hours) at 03/31/2018 0758 Last data filed at 03/31/2018 0541 Gross per 24 hour  Intake 360 ml  Output 401 ml  Net -41 ml    Inpatient Medications:  . apixaban  5 mg Oral BID  . aspirin EC  81 mg Oral Daily  . atorvastatin  20 mg Oral Daily  . calcium carbonate  200 mg of elemental calcium Oral Daily  . dextromethorphan  30 mg Oral BID   And  . guaiFENesin  600 mg Oral BID  . docusate sodium  100 mg Oral BID  . ferrous sulfate  325 mg Oral Q breakfast  . furosemide  40 mg Intravenous BID  . multivitamin with minerals   Oral Daily  . potassium chloride  40 mEq Oral Once  . pyridOXINE  50 mg Oral Daily  . tamsulosin  0.4 mg Oral QPC supper  . vitamin E  400 Units Oral Daily   Infusions:   Labs: Recent Labs    03/30/18 0448 03/31/18 0436  NA 132* 133*  K 3.3* 3.4*  CL 99 100  CO2 24 27  GLUCOSE 125* 113*  BUN 12 18  CREATININE 0.49* 0.68  CALCIUM 8.4* 8.3*   No results for input(s): AST, ALT, ALKPHOS, BILITOT, PROT, ALBUMIN in the last 72 hours. Recent Labs    03/29/18 1741 03/30/18 0448  WBC 11.5* 10.6*  HGB 13.3 12.6*  HCT 39.6 36.6*  MCV 103.1* 100.0  PLT 266 245   Recent Labs    03/29/18 1741 03/29/18 1950  TROPONINI <0.03 <0.03   Invalid input(s): POCBNP No results for input(s): HGBA1C in the last 72 hours.   Weights: [employer]   03/30/18 0113 03/31/18 4098  Weight: 85.2 kg 83.7 kg     Radiology/Studies:  Dg Chest 2 View  Result Date: 03/29/2018 CLINICAL DATA:  Shortness of breath and left rib pain. EXAM: CHEST - 2 VIEW COMPARISON:  04/07/2015 FINDINGS: Enlarged cardiac silhouette. Calcific atherosclerotic disease of the aorta. Mediastinal contours appear intact. There is no evidence of focal airspace consolidation, or  pneumothorax. Mild coarsening of the interstitial markings. Bibasilar atelectasis. Left lateral pleural thickening, nonspecific. Healed right-sided rib fractures.  Soft tissues are grossly normal. IMPRESSION: Mild interstitial pulmonary edema. Enlarged cardiac silhouette. No evidence of lobar consolidation. Electronically Signed   By: Fidela Salisbury M.D.   On: 03/29/2018 18:42   Dg Thoracic Spine 2 View  Result Date: 03/29/2018 CLINICAL DATA:  Fall a few days ago.  Upper back pain. EXAM: THORACIC SPINE 2 VIEWS COMPARISON:  Chest x-rays since November 19, 2014 FINDINGS: Anterior wedging of a lower thoracic or upper lumbar vertebral body, unchanged since 2016. Degenerative changes, most marked did in the upper lumbar spine. T1 and T2 were best evaluated on the C-spine CT from today without fracture. IMPRESSION: No acute fracture or traumatic malalignment.  Degenerative changes. Electronically Signed   By: Dorise Bullion III M.D   On: 03/29/2018 19:17   Ct Head Wo Contrast  Result Date: 03/29/2018 CLINICAL DATA:  Fall a few days ago.  Pain. EXAM: CT HEAD WITHOUT CONTRAST CT CERVICAL SPINE WITHOUT CONTRAST TECHNIQUE: Multidetector CT imaging of the head and cervical spine was performed following the standard protocol without intravenous contrast. Multiplanar CT image reconstructions of the cervical spine were also generated. COMPARISON:  None. FINDINGS: CT HEAD FINDINGS Brain: No subdural, epidural, or subarachnoid hemorrhage. White matter changes are noted, chronic in appearance. No acute cortical ischemia infarct. Cerebellum, brainstem, and basal cisterns are normal. Ventricles and sulci are unremarkable. No mass effect or midline shift. Vascular: No hyperdense vessel or unexpected calcification. Skull: Normal. Negative for fracture or focal lesion. Sinuses/Orbits: No acute finding. Other: None. CT CERVICAL SPINE FINDINGS Alignment: There is 4 mm of anterolisthesis of C5 versus C6. No other malalignment.  Skull base and vertebrae: No acute fracture. No primary bone lesion or focal pathologic process. Soft tissues and spinal canal: No prevertebral fluid or swelling. No visible canal hematoma. Disc levels: Multilevel degenerative disc disease and facet degenerative changes. Upper chest: Negative. Other: No other abnormalities. IMPRESSION: 1. No acute intracranial abnormalities. 2. 4 mm of anterolisthesis of C5 versus C6 is favored to be degenerative. No fractures noted. No traumatic malalignment. Electronically Signed   By: Dorise Bullion III M.D   On: 03/29/2018 19:15   Ct Cervical Spine Wo Contrast  Result Date: 03/29/2018 CLINICAL DATA:  Fall a few days ago.  Pain. EXAM: CT HEAD WITHOUT CONTRAST CT CERVICAL SPINE WITHOUT CONTRAST TECHNIQUE: Multidetector CT imaging of the head and cervical spine was performed following the standard protocol without intravenous contrast. Multiplanar CT image reconstructions of the cervical spine were also generated. COMPARISON:  None. FINDINGS: CT HEAD FINDINGS Brain: No subdural, epidural, or subarachnoid hemorrhage. White matter changes are noted, chronic in appearance. No acute cortical ischemia infarct. Cerebellum, brainstem, and basal cisterns are normal. Ventricles and sulci are unremarkable. No mass effect or midline shift. Vascular: No hyperdense vessel or unexpected calcification. Skull: Normal. Negative for fracture or focal lesion. Sinuses/Orbits: No acute finding. Other: None. CT CERVICAL SPINE FINDINGS Alignment: There is 4 mm of anterolisthesis of C5 versus C6. No other malalignment. Skull base and vertebrae: No acute fracture. No primary bone lesion  or focal pathologic process. Soft tissues and spinal canal: No prevertebral fluid or swelling. No visible canal hematoma. Disc levels: Multilevel degenerative disc disease and facet degenerative changes. Upper chest: Negative. Other: No other abnormalities. IMPRESSION: 1. No acute intracranial abnormalities. 2. 4 mm of  anterolisthesis of C5 versus C6 is favored to be degenerative. No fractures noted. No traumatic malalignment. Electronically Signed   By: Dorise Bullion III M.D   On: 03/29/2018 19:15     Assessment and Recommendation  84 y.o. 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  Continue Lasix and change to oral Lasix at this time for lower extremity edema and pulmonary edema due to heart failure 2.  Continue anticoagulation for further risk reduction in stroke with atrial fibrillation 3.  No additional medication management to for heart rate control of atrial fibrillation 4.  High intensity cholesterol therapy with atorvastatin 5.  Consider discontinuation of aspirin due to concerns of bleeding complication and fall 6.  Begin ambulation and follow for improvements of symptoms and possible discharge home from cardiac standpoint with follow-up in the next 2 weeks for adjustments of medication management 7.  Call if further questions  Signed, Serafina Royals M.D. FACC

## 2018-03-31 NOTE — Discharge Summary (Signed)
Douglas Calhoun at [place] NAME: Douglas Calhoun    MR#:  [id number]  DATE OF BIRTH:  10-30-1919  DATE OF ADMISSION:  03/29/2018 ADMITTING PHYSICIAN: Amelia Jo, MD  DATE OF DISCHARGE: No discharge date for patient encounter.  PRIMARY CARE PHYSICIAN: Birdie Sons, MD   ADMISSION DIAGNOSIS:  Hypoxia [R09.02] Fall, initial encounter [W19.XXXA] Dyspnea, unspecified type [D32.67] Systolic congestive heart failure, unspecified HF chronicity (HCC) [I50.20] Chronic atrial flutter DISCHARGE DIAGNOSIS:  Acute systolic congestive heart failure exacerbation Hypoxia secondary to heart failure exacerbation Chronic atrial fibrillation Hyperlipidemia  Mechanical falls Atrial flutter SECONDARY DIAGNOSIS:   Past Medical History:  Diagnosis Date  . Achilles tendinitis   . Cataract   . Hearing loss   . Herpes zoster without complication 05/20/5807  . Personal history of prostate cancer 10/08/2014   s/p 15 radiation treatments treated by Dr. Madelin Headings      ADMITTING HISTORY Douglas Calhoun  is a 84 y.o. male with a known history of hearing loss, cataracts, prostate cancer, status post radiation treatment, hypertension, hyperlipidemia, osteoporosis and other comorbidities.  He lives by himself. Patient is a poor historian, due to poor memory and hearing deficit.  He usually follows with [facility].  Most information was obtained from reviewing the medical records and from discussion with emergency room physician.Patient was brought to emergency room due to shortness of breath with exertion and left upper back pain, status post a mechanical fall 2 days ago.  He noted shortness of breath with exertion going on for the past 3 to 4 days, gradually getting worse.  He denies any dizziness, chest pain, palpitations, fever or chills, no cough, no nausea, no vomiting, no diarrhea, no bleeding.At the arrival to emergency room, patient was noted with low oxygen saturation, at  86% on room air.Blood test done emergency room including CBC and CMP are grossly unremarkable except for WBC at 11.5.  The first 2 troponin levels are less than 0.03.EKG shows atrial flutter with heart rate at 92 and PVCs.  No acute ischemic changes.Chest x-ray shows mild interstitial pulmonary edema.No acute abnormalities per brain CT and cervical spine CT. Is admitted for further evaluation and treatment.  HOSPITAL COURSE:  Patient was admitted to medical floor with cardiac monitoring.  Patient was diuresed aggressively with IV Lasix 40 MDQ 12 hourly along with potassium supplementation.  His edema in the lower extremities improved and shortness of breath also improved.  He was initially hypoxic and on oxygen via nasal cannula.  His oxygen via nasal cannula was weaned off and he was comfortable on room air.  Patient was seen by cardiology during the hospitalization and worked up with echocardiogram.  Echo showed EF of 45 to 98% with systolic dysfunction and no wall motion abnormality.  Patient will be discharged home on diuretics and follow-up with cardiology in the clinic.  He was also seen in the hospital by physical therapy and Occupational Therapy who recommended home health services. Patient will be discharged with home health services and home physical therapy service along with social worker and RN support.   CONSULTS OBTAINED:  Treatment Team:  Corey Skains, MD  DRUG ALLERGIES:  No Known Allergies  DISCHARGE MEDICATIONS:   Allergies as of 03/31/2018   No Known Allergies     Medication List    STOP taking these medications   aspirin EC 81 MG tablet   KLOR-CON PO Replaced by:  potassium chloride 10 MEQ tablet  TAKE these medications   alendronate 70 MG tablet Commonly known as:  FOSAMAX Take 70 mg by mouth once a week. Take with a full glass of water on an empty stomach on Saturdays.   apixaban 5 MG Tabs tablet Commonly known as:  ELIQUIS Take 5 mg by mouth 2 (two)  times daily.   atorvastatin 20 MG tablet Commonly known as:  LIPITOR Take 20 mg by mouth daily. 1/2 a pill at bedtime   Calcium 500 MG tablet Take 1 tablet by mouth daily.   camphor-menthol lotion Commonly known as:  SARNA Apply 1 application topically as needed for itching.   Cyanocobalamin 1000 MCG Caps Take by mouth.   eucerin cream Apply topically as needed for dry skin.   ferrous sulfate 325 (65 FE) MG tablet Take 325 mg by mouth 3 (three) times daily.   furosemide 20 MG tablet Commonly known as:  LASIX Take 20 mg by mouth.   HYDROcodone-acetaminophen 5-325 MG tablet Commonly known as:  NORCO/VICODIN Take 1 tablet by mouth 2 (two) times daily.   isosorbide dinitrate 30 MG tablet Commonly known as:  ISORDIL Take 1 tablet (30 mg total) by mouth 2 (two) times daily. What changed:  when to take this   MULTIVITAMIN ADULT PO Take 1 tablet by mouth daily.   potassium chloride 10 MEQ tablet Commonly known as:  K-DUR Take 1 tablet (10 mEq total) by mouth daily. Replaces:  KLOR-CON PO   pyridOXINE 50 MG tablet Commonly known as:  B-6 Take 50 mg by mouth daily.   tamsulosin 0.4 MG Caps capsule Commonly known as:  FLOMAX Take 0.4 mg by mouth daily after supper.   triamcinolone ointment 0.1 % Commonly known as:  KENALOG Apply 1 application topically 2 (two) times daily.   Vitamin D3 25 MCG (1000 UT) Caps Take by mouth.   Vitamin E 400 units Tabs Take 1 tablet by mouth daily.       Today  Patient seen today Decrease shortness of breath Completely weaned off oxygen Good appetite Has a rolling walker at home  VITAL SIGNS:  Blood pressure 122/66, pulse 74, temperature 97.6 F (36.4 C), temperature source Oral, resp. rate 20, height 5\' 10"  (1.778 m), weight 83.7 kg, SpO2 91 %.  I/O:    Intake/Output Summary (Last 24 hours) at 03/31/2018 1405 Last data filed at 03/31/2018 0541 Gross per 24 hour  Intake 0 ml  Output 201 ml  Net -201 ml    PHYSICAL  EXAMINATION:  Physical Exam  GENERAL:  84 y.o.-year-old patient lying in the bed with no acute distress.  LUNGS: Normal breath sounds bilaterally, no wheezing, rales,rhonchi or crepitation. No use of accessory muscles of respiration.  CARDIOVASCULAR: S1, S2 normal. No murmurs, rubs, or gallops.  ABDOMEN: Soft, non-tender, non-distended. Bowel sounds present. No organomegaly or mass.  NEUROLOGIC: Moves all 4 extremities. PSYCHIATRIC: The patient is alert and oriented x 3.  SKIN: No obvious rash, lesion, or ulcer.   DATA REVIEW:   CBC Recent Labs  Lab 03/30/18 0448  WBC 10.6*  HGB 12.6*  HCT 36.6*  PLT 245    Chemistries  Recent Labs  Lab 03/31/18 0436  NA 133*  K 3.4*  CL 100  CO2 27  GLUCOSE 113*  BUN 18  CREATININE 0.68  CALCIUM 8.3*    Cardiac Enzymes Recent Labs  Lab 03/29/18 1950  TROPONINI <0.03    Microbiology Results  Results for orders placed or performed during the hospital encounter of  10/24/15  Urine culture     Status: None   Collection Time: 10/24/15  9:23 AM  Result Value Ref Range Status   Specimen Description URINE, RANDOM  Final   Special Requests NONE  Final   Culture NO GROWTH Performed at [facility]   Final   Report Status 10/25/2015 FINAL  Final    RADIOLOGY:  Dg Chest 2 View  Result Date: 03/29/2018 CLINICAL DATA:  Shortness of breath and left rib pain. EXAM: CHEST - 2 VIEW COMPARISON:  04/07/2015 FINDINGS: Enlarged cardiac silhouette. Calcific atherosclerotic disease of the aorta. Mediastinal contours appear intact. There is no evidence of focal airspace consolidation, or pneumothorax. Mild coarsening of the interstitial markings. Bibasilar atelectasis. Left lateral pleural thickening, nonspecific. Healed right-sided rib fractures.  Soft tissues are grossly normal. IMPRESSION: Mild interstitial pulmonary edema. Enlarged cardiac silhouette. No evidence of lobar consolidation. Electronically Signed   By: Fidela Salisbury  M.D.   On: 03/29/2018 18:42   Dg Thoracic Spine 2 View  Result Date: 03/29/2018 CLINICAL DATA:  Fall a few days ago.  Upper back pain. EXAM: THORACIC SPINE 2 VIEWS COMPARISON:  Chest x-rays since November 19, 2014 FINDINGS: Anterior wedging of a lower thoracic or upper lumbar vertebral body, unchanged since 2016. Degenerative changes, most marked did in the upper lumbar spine. T1 and T2 were best evaluated on the C-spine CT from today without fracture. IMPRESSION: No acute fracture or traumatic malalignment.  Degenerative changes. Electronically Signed   By: Dorise Bullion III M.D   On: 03/29/2018 19:17   Ct Head Wo Contrast  Result Date: 03/29/2018 CLINICAL DATA:  Fall a few days ago.  Pain. EXAM: CT HEAD WITHOUT CONTRAST CT CERVICAL SPINE WITHOUT CONTRAST TECHNIQUE: Multidetector CT imaging of the head and cervical spine was performed following the standard protocol without intravenous contrast. Multiplanar CT image reconstructions of the cervical spine were also generated. COMPARISON:  None. FINDINGS: CT HEAD FINDINGS Brain: No subdural, epidural, or subarachnoid hemorrhage. White matter changes are noted, chronic in appearance. No acute cortical ischemia infarct. Cerebellum, brainstem, and basal cisterns are normal. Ventricles and sulci are unremarkable. No mass effect or midline shift. Vascular: No hyperdense vessel or unexpected calcification. Skull: Normal. Negative for fracture or focal lesion. Sinuses/Orbits: No acute finding. Other: None. CT CERVICAL SPINE FINDINGS Alignment: There is 4 mm of anterolisthesis of C5 versus C6. No other malalignment. Skull base and vertebrae: No acute fracture. No primary bone lesion or focal pathologic process. Soft tissues and spinal canal: No prevertebral fluid or swelling. No visible canal hematoma. Disc levels: Multilevel degenerative disc disease and facet degenerative changes. Upper chest: Negative. Other: No other abnormalities. IMPRESSION: 1. No acute  intracranial abnormalities. 2. 4 mm of anterolisthesis of C5 versus C6 is favored to be degenerative. No fractures noted. No traumatic malalignment. Electronically Signed   By: Dorise Bullion III M.D   On: 03/29/2018 19:15   Ct Cervical Spine Wo Contrast  Result Date: 03/29/2018 CLINICAL DATA:  Fall a few days ago.  Pain. EXAM: CT HEAD WITHOUT CONTRAST CT CERVICAL SPINE WITHOUT CONTRAST TECHNIQUE: Multidetector CT imaging of the head and cervical spine was performed following the standard protocol without intravenous contrast. Multiplanar CT image reconstructions of the cervical spine were also generated. COMPARISON:  None. FINDINGS: CT HEAD FINDINGS Brain: No subdural, epidural, or subarachnoid hemorrhage. White matter changes are noted, chronic in appearance. No acute cortical ischemia infarct. Cerebellum, brainstem, and basal cisterns are normal. Ventricles and sulci are unremarkable. No mass  effect or midline shift. Vascular: No hyperdense vessel or unexpected calcification. Skull: Normal. Negative for fracture or focal lesion. Sinuses/Orbits: No acute finding. Other: None. CT CERVICAL SPINE FINDINGS Alignment: There is 4 mm of anterolisthesis of C5 versus C6. No other malalignment. Skull base and vertebrae: No acute fracture. No primary bone lesion or focal pathologic process. Soft tissues and spinal canal: No prevertebral fluid or swelling. No visible canal hematoma. Disc levels: Multilevel degenerative disc disease and facet degenerative changes. Upper chest: Negative. Other: No other abnormalities. IMPRESSION: 1. No acute intracranial abnormalities. 2. 4 mm of anterolisthesis of C5 versus C6 is favored to be degenerative. No fractures noted. No traumatic malalignment. Electronically Signed   By: Dorise Bullion III M.D   On: 03/29/2018 19:15    Follow up with PCP in 1 week.  Management plans discussed with the patient, family and they are in agreement.  CODE STATUS: Full code    Code Status  Orders  (From admission, onward)         Start     Ordered   03/30/18 0124  Full code  Continuous     03/30/18 0123        Code Status History    This patient has a current code status but no historical code status.    Advance Directive Documentation     Most Recent Value  Type of Advance Directive  Living will  Pre-existing out of facility DNR order (yellow form or pink MOST form)  -  "MOST" Form in Place?  -      TOTAL TIME TAKING CARE OF THIS PATIENT ON DAY OF DISCHARGE: more than 34 minutes.   Saundra Shelling M.D on 03/31/2018 at 2:05 PM  Between 7am to 6pm - Pager - [phone]  After 6pm go to [email or web address] - password [email or web address] [facility]  [facility] [place] [facility]  Office  [phone]  CC: Primary care physician; Birdie Sons, MD  Note: This dictation was prepared with Dragon dictation along with smaller phrase technology. Any transcriptional errors that result from this process are unintentional.

## 2018-03-31 NOTE — Care Management Important Message (Signed)
Copy of signed IM left with patient in room.  

## 2018-03-31 NOTE — Progress Notes (Signed)
Advanced care plan.  Purpose of the Encounter: CODE STATUS  Parties in Attendance: Patient  Patient's Decision Capacity: Okay  Subjective/Patient's story: Presented to the emergency room for shortness of breath   Objective/Medical story Patient has congestive heart failure with fluid overload Needs IV Lasix for diuresis and echocardiogram and work-up   Goals of care determination:  Advance care directives goals of care and treatment plan discussed Patient for now wants everything done which includes CPR, intubation if need arises   CODE STATUS: Full code   Time spent discussing advanced care planning: 16 minutes

## 2018-03-31 NOTE — Care Management Note (Signed)
Case Management Note  Patient Details  Name: DAILYN KEMPNER MRN: [id number] Date of Birth: [birth year]   Patient admitted from home Acute respiratory distress secondary pulmonary edema.  Patient lives at home with his wife. Patient is primary caregiver for his wife who has dementia.  Patient states that at baseline he can still drive. His daughter in law, son in law, and neighbor are also available for support and transportation. PCP [facility].  Patient states that he is also seen by the [place].  Patient states that he has a RW and rollator in the home.  PT has recommended home health PT.  Patient agreeable to services.  Patient provided CMS Medicare.gov Compare Post Acute Care list and reviewed.  Patient states that he does not have a preference of agency.  Referral made to [facility] with [place].   Neighbor transported at discharge.   Subjective/Objective:                    Action/Plan:   Expected Discharge Date:  03/31/18               Expected Discharge Plan:  [place]  In-House Referral:     Discharge planning Services  CM Consult  Post Acute Care Choice:  Home Health Choice offered to:  Patient  DME Arranged:    DME Agency:     HH Arranged:  RN, PT, OT HH Agency:  [facility]  Status of Service:  Completed, signed off  If discussed at [place] of Stay Meetings, dates discussed:    Additional Comments:  Beverly Sessions, RN 03/31/2018, 12:19 PM

## 2018-04-03 ENCOUNTER — Inpatient Hospital Stay: Payer: Self-pay | Admitting: Family Medicine

## 2018-04-05 ENCOUNTER — Telehealth: Payer: Self-pay | Admitting: Family Medicine

## 2018-04-05 NOTE — Telephone Encounter (Signed)
Tried calling Amy. Left message to call back.

## 2018-04-05 NOTE — Telephone Encounter (Signed)
Verbal orders given to Amy 

## 2018-04-05 NOTE — Telephone Encounter (Signed)
Carlota Raspberry, RN w/ [phone]  Needing approval for plan of Care: 2 times for 1 week 1 time a week for 3 weeks  Thanks, [facility]

## 2018-04-05 NOTE — Telephone Encounter (Signed)
That's fine

## 2018-04-10 ENCOUNTER — Telehealth: Payer: Self-pay | Admitting: Family Medicine

## 2018-04-10 NOTE — Telephone Encounter (Signed)
That's fine

## 2018-04-10 NOTE — Telephone Encounter (Signed)
Jeff, [place]  Verbal Orders for PT:  1 time a week for 1 week 2 times a week for 1 week 1 time a week for 2 weeks  Thanks, [place]

## 2018-04-10 NOTE — Telephone Encounter (Signed)
Nancy advised.   Thanks,   -Laura  

## 2018-04-10 NOTE — Telephone Encounter (Signed)
Apt made for 04/12/2018 at 10:40.  Pt advised.   Thanks,   -Mickel Baas

## 2018-04-10 NOTE — Telephone Encounter (Signed)
Jeff advised. 

## 2018-04-10 NOTE — Telephone Encounter (Signed)
Douglas Calhoun w/ [facility] needs verbal order for OT 1 time a week for 3 weeks.

## 2018-04-10 NOTE — Telephone Encounter (Signed)
Can have the 10:40 Wednesday.

## 2018-04-10 NOTE — Telephone Encounter (Signed)
Pt had a Hospital F/U on Mon 04-03-18 that he missed.  He forgot the appt or wasn't sure he had one. He is needing another Hospital F/U visit scheduled asap.  Please call pt back to work him in with Dr. Caryn Section.  Thanks, [employer]

## 2018-04-10 NOTE — Telephone Encounter (Signed)
Where can we schedule him?  Thanks,   -Mickel Baas

## 2018-04-11 ENCOUNTER — Ambulatory Visit: Payer: Medicare Other | Admitting: Family

## 2018-04-12 ENCOUNTER — Encounter: Payer: Self-pay | Admitting: Family Medicine

## 2018-04-12 ENCOUNTER — Ambulatory Visit: Payer: Medicare Other | Admitting: Family Medicine

## 2018-04-12 VITALS — BP 116/70 | HR 72 | Temp 98.2°F | Resp 16 | Wt 191.0 lb

## 2018-04-12 DIAGNOSIS — R32 Unspecified urinary incontinence: Secondary | ICD-10-CM

## 2018-04-12 DIAGNOSIS — I509 Heart failure, unspecified: Secondary | ICD-10-CM

## 2018-04-12 DIAGNOSIS — Z23 Encounter for immunization: Secondary | ICD-10-CM

## 2018-04-12 MED ORDER — DICLOFENAC EPOLAMINE 1.3 % TD PTCH: MEDICATED_PATCH | TRANSDERMAL | 1 refills | Status: DC

## 2018-04-12 NOTE — Patient Instructions (Signed)
.   Please go to the lab draw station in [address] on the second floor of [facility]  

## 2018-04-12 NOTE — Progress Notes (Signed)
Patient: Douglas Calhoun Male    DOB: 25-May-1919   84 y.o.   MRN: [id number] Visit Date: 04/12/2018  Today's Provider: Lelon Huh, MD   Chief Complaint  Patient presents with  . Hospitalization Follow-up  . Shortness of Breath  . Fall  . Congestive Heart Failure   Subjective:     HPI    Follow up Hospitalization  Patient was admitted to [facility] on 03/29/2018 and discharged on 03/31/2018. He was treated for dyspnea, fall, systolic CHF, hypoxia. Treatment for this included IV Lasix with Potassium supplemetation; O2 nasal canula (Later weaned to room air) Discharged on Diuretics, referral to home health services, physical therapy along with Education officer, museum and RN support.   He had follow up with Dr. Nehemiah Massed yesterday and is stable.  He reports excellent compliance with treatment. He reports this condition is resolved. .  His only complain today is having back across his lower and mid back. No specific injuries, but worse when up and moving around.  ------------------------------------------------------------------------------------     No Known Allergies   Current Outpatient Medications:  .  alendronate (FOSAMAX) 70 MG tablet, Take 70 mg by mouth once a week. Take with a full glass of water on an empty stomach on Saturdays., Disp: , Rfl:  .  apixaban (ELIQUIS) 5 MG TABS tablet, Take 5 mg by mouth 2 (two) times daily., Disp: , Rfl:  .  atorvastatin (LIPITOR) 20 MG tablet, Take 20 mg by mouth daily. 1/2 a pill at bedtime, Disp: , Rfl:  .  Calcium 500 MG tablet, Take 1 tablet by mouth daily., Disp: , Rfl:  .  camphor-menthol (SARNA) lotion, Apply 1 application topically as needed for itching., Disp: , Rfl:  .  Cholecalciferol (VITAMIN D3) 1000 units CAPS, Take by mouth., Disp: , Rfl:  .  Cyanocobalamin 1000 MCG CAPS, Take by mouth., Disp: , Rfl:  .  ferrous sulfate 325 (65 FE) MG tablet, Take 325 mg by mouth 3 (three) times daily. , Disp: , Rfl:  .  furosemide (LASIX)  20 MG tablet, Take 20 mg by mouth., Disp: , Rfl:  .  isosorbide dinitrate (ISORDIL) 30 MG tablet, Take 1 tablet (30 mg total) by mouth 2 (two) times daily., Disp: 60 tablet, Rfl: 0 .  Multiple Vitamins-Minerals (MULTIVITAMIN ADULT PO), Take 1 tablet by mouth daily., Disp: , Rfl:  .  potassium chloride (KLOR-CON 10) 10 MEQ tablet, Take 1 tablet (10 mEq total) by mouth daily., Disp: 30 tablet, Rfl: 0 .  pyridOXINE (B-6) 50 MG tablet, Take 50 mg by mouth daily., Disp: , Rfl:  .  Skin Protectants, Misc. (EUCERIN) cream, Apply topically as needed for dry skin., Disp: , Rfl:  .  tamsulosin (FLOMAX) 0.4 MG CAPS capsule, Take 0.8 mg by mouth daily after supper. , Disp: , Rfl:  .  Vitamin E 400 UNITS TABS, Take 1 tablet by mouth daily., Disp: , Rfl:  .  HYDROcodone-acetaminophen (NORCO/VICODIN) 5-325 MG tablet, Take 1 tablet by mouth 2 (two) times daily. (Patient not taking: Reported on 04/12/2018), Disp: 14 tablet, Rfl: 0 .  triamcinolone ointment (KENALOG) 0.1 %, Apply 1 application topically 2 (two) times daily., Disp: , Rfl:   Review of Systems  Constitutional: Negative.   Respiratory: Positive for shortness of breath. Negative for apnea, cough, choking, chest tightness, wheezing and stridor.   Cardiovascular: Negative.   Gastrointestinal: Negative.   Musculoskeletal: Positive for back pain. Negative for arthralgias, gait problem, joint swelling, myalgias,  neck pain and neck stiffness.  Neurological: Negative for dizziness, light-headedness and headaches.    Social History   Tobacco Use  . Smoking status: Never Smoker  . Smokeless tobacco: Never Used  Substance Use Topics  . Alcohol use: Yes    Alcohol/week: 0.0 standard drinks    Comment: drinks 2 beers a month      Objective:   BP 116/70 (BP Location: Right Arm, Patient Position: Sitting, Cuff Size: Normal)   Pulse 72   Temp 98.2 F (36.8 C) (Oral)   Resp 16   Wt 191 lb (86.6 kg)   BMI 27.41 kg/m  Vitals:   04/12/18 1105  BP:  116/70  Pulse: 72  Resp: 16  Temp: 98.2 F (36.8 C)  TempSrc: Oral  Weight: 191 lb (86.6 kg)    Physical Exam   General Appearance:    Alert, cooperative, no distress  Eyes:    PERRL, conjunctiva/corneas clear, EOM's intact       Lungs:     Clear to auscultation bilaterally, respirations unlabored  Heart:    Regular rate and rhythm  MS:   Tender along mid para thoracic and para lumbar muscles.          Assessment & Plan    1. Congestive heart failure, unspecified HF chronicity, unspecified heart failure type ([place]) Greatly improved with recent hospitalization. Continue current medications.  Continue routine cardiology follow up.  - Renal function panel - CBC  2. Need for influenza vaccination  - Flu vaccine HIGH DOSE PF (Fluzone High dose)  3. Urinary incontinence, unspecified type He self caths once every night to completely empty bladder. He had been having this managed at [place], but now finds it too difficult to travel. He would like to establish with local urologist. He doesn't want referral done quite yet. Advised it would be helpful to get urology records from [place] and he can call whenever he is ready for referral to be done.      Lelon Huh, MD  [facility]

## 2018-04-13 ENCOUNTER — Other Ambulatory Visit: Payer: Self-pay

## 2018-04-13 LAB — RENAL FUNCTION PANEL
Albumin: 4.1 g/dL (ref 3.2–4.6)
BUN/Creatinine Ratio: 21 (ref 10–24)
BUN: 15 mg/dL (ref 10–36)
CO2: 20 mmol/L (ref 20–29)
Calcium: 9.5 mg/dL (ref 8.6–10.2)
Chloride: 100 mmol/L (ref 96–106)
Creatinine, Ser: 0.73 mg/dL — ABNORMAL LOW (ref 0.76–1.27)
GFR calc Af Amer: 91 mL/min/{1.73_m2} (ref >59)
GFR calc non Af Amer: 78 mL/min/{1.73_m2} (ref >59)
Glucose: 107 mg/dL — ABNORMAL HIGH (ref 65–99)
PHOSPHORUS: 4.4 mg/dL (ref 2.5–4.5)
Potassium: 4.9 mmol/L (ref 3.5–5.2)
Sodium: 136 mmol/L (ref 134–144)

## 2018-04-13 LAB — CBC
Hematocrit: 37.1 % — ABNORMAL LOW (ref 37.5–51.0)
Hemoglobin: 12.5 g/dL — ABNORMAL LOW (ref 13.0–17.7)
MCH: 33.6 pg — ABNORMAL HIGH (ref 26.6–33.0)
MCHC: 33.7 g/dL (ref 31.5–35.7)
MCV: 100 fL — ABNORMAL HIGH (ref 79–97)
Platelets: 372 10*3/uL (ref 150–450)
RBC: 3.72 x10E6/uL — AB (ref 4.14–5.80)
RDW: 11.7 % — ABNORMAL LOW (ref 12.3–15.4)
WBC: 10.7 10*3/uL (ref 3.4–10.8)

## 2018-04-13 MED ORDER — HYDROCODONE-ACETAMINOPHEN 5-325 MG PO TABS: 1.0000 | ORAL_TABLET | Freq: Two times a day (BID) | ORAL | 0 refills | Status: DC

## 2018-04-13 NOTE — Telephone Encounter (Signed)
Pt advised.  He would like a refill on his Hydrocodone sent to [facility].   Please advise.  Thanks,   -Mickel Baas

## 2018-04-13 NOTE — Addendum Note (Signed)
Addended by: Birdie Sons on: 04/13/2018 08:52 PM   Modules accepted: Orders

## 2018-04-13 NOTE — Telephone Encounter (Signed)
-----   Message from Birdie Sons, MD sent at 04/13/2018 10:58 AM EST ----- Labs all look good. Normal kidney functions, electrolytes and blood cell counts. Continue current medications.  Continue current medications.

## 2018-04-14 ENCOUNTER — Ambulatory Visit: Payer: Medicare Other | Attending: Family | Admitting: Family

## 2018-04-14 ENCOUNTER — Telehealth: Payer: Self-pay

## 2018-04-14 ENCOUNTER — Encounter: Payer: Self-pay | Admitting: Family

## 2018-04-14 VITALS — BP 109/67 | HR 82 | Resp 18 | Ht 70.0 in | Wt 192.0 lb

## 2018-04-14 DIAGNOSIS — Z8249 Family history of ischemic heart disease and other diseases of the circulatory system: Secondary | ICD-10-CM | POA: Diagnosis not present

## 2018-04-14 DIAGNOSIS — Z79899 Other long term (current) drug therapy: Secondary | ICD-10-CM | POA: Diagnosis not present

## 2018-04-14 DIAGNOSIS — Z9889 Other specified postprocedural states: Secondary | ICD-10-CM | POA: Diagnosis not present

## 2018-04-14 DIAGNOSIS — I5022 Chronic systolic (congestive) heart failure: Secondary | ICD-10-CM

## 2018-04-14 DIAGNOSIS — I509 Heart failure, unspecified: Secondary | ICD-10-CM | POA: Diagnosis present

## 2018-04-14 DIAGNOSIS — Z7901 Long term (current) use of anticoagulants: Secondary | ICD-10-CM | POA: Insufficient documentation

## 2018-04-14 DIAGNOSIS — I4891 Unspecified atrial fibrillation: Secondary | ICD-10-CM | POA: Diagnosis not present

## 2018-04-14 DIAGNOSIS — I872 Venous insufficiency (chronic) (peripheral): Secondary | ICD-10-CM | POA: Diagnosis not present

## 2018-04-14 DIAGNOSIS — Z79891 Long term (current) use of opiate analgesic: Secondary | ICD-10-CM | POA: Insufficient documentation

## 2018-04-14 DIAGNOSIS — Z8546 Personal history of malignant neoplasm of prostate: Secondary | ICD-10-CM | POA: Insufficient documentation

## 2018-04-14 DIAGNOSIS — I480 Paroxysmal atrial fibrillation: Secondary | ICD-10-CM

## 2018-04-14 NOTE — Telephone Encounter (Signed)
Caryl Pina, an OT with [facility] is requesting verbal orders for 1x week for 3 weeks OT for the patient. Please review. Contact number is [phone]. Thanks!

## 2018-04-14 NOTE — Progress Notes (Signed)
Patient ID: DETRELL UMSCHEID, male    DOB: 1919-12-19, 84 y.o.   MRN: [id number]  HPI  Mr Greis is a 84 y/o male with a history of atrial fibrillation, prostate cancer, hearing loss and chronic heart failure.   Echo report from 03/30/18 showed an EF of 45-50% along with normal PA pressure  Admitted 03/29/18 due to acute HF. Cardiology consult obtained. Initially given IV lasix and then transitioned to oral diuretics. Had to have oxygen via nasal cannula to start with and then was able to go to room air. Discharged home after 2 days.   He presents today for his initial visit with a chief complaint of moderate shortness of breath upon minimal exertion. He describes this as chronic having been present for several years. He has associated decreased appetite, hearing loss and palpitations along with this. He denies any difficulty sleeping, abdominal distention, pedal edema, chest pain, fatigue, dizziness or weight gain.   Past Medical History:  Diagnosis Date  . Achilles tendinitis   . Arrhythmia    atrial fibrillation  . Cataract   . CHF (congestive heart failure) ([place])   . Hearing loss   . Herpes zoster without complication 12/27/4095  . Personal history of prostate cancer 10/08/2014   s/p 15 radiation treatments treated by Dr. Madelin Headings   . Varicose vein of leg    Past Surgical History:  Procedure Laterality Date  . APPENDECTOMY  1960's  . BREAST SURGERY     Breast Biopsy prior to 1960  . CATARACT EXTRACTION Left    Family History  Problem Relation Age of Onset  . Heart disease Mother   . Stroke Mother    Social History   Tobacco Use  . Smoking status: Never Smoker  . Smokeless tobacco: Never Used  Substance Use Topics  . Alcohol use: Yes    Alcohol/week: 0.0 standard drinks    Comment: drinks 2 beers a month   No Known Allergies Prior to Admission medications   Medication Sig Start Date End Date Taking? Authorizing Provider  alendronate (FOSAMAX) 70 MG tablet Take 70 mg by  mouth once a week. Take with a full glass of water on an empty stomach on Saturdays.   Yes [provider]  apixaban (ELIQUIS) 5 MG TABS tablet Take 5 mg by mouth 2 (two) times daily.   Yes [provider]  atorvastatin (LIPITOR) 20 MG tablet Take 20 mg by mouth daily. 1/2 a pill at bedtime   Yes [provider]  Calcium 500 MG tablet Take 1 tablet by mouth daily. 07/17/08  Yes [provider]  camphor-menthol Timoteo Ace) lotion Apply 1 application topically as needed for itching.   Yes [provider]  Cholecalciferol (VITAMIN D3) 1000 units CAPS Take by mouth.   Yes [provider]  Cyanocobalamin 1000 MCG CAPS Take by mouth.   Yes [provider]  diclofenac (FLECTOR) 1.3 % PTCH Apply one patch once or twice a day 04/12/18  Yes Fisher, Kirstie Peri, MD  ferrous sulfate 325 (65 FE) MG tablet Take 325 mg by mouth 3 (three) times daily.    Yes [provider]  furosemide (LASIX) 20 MG tablet Take 20 mg by mouth.   Yes [provider]  HYDROcodone-acetaminophen (NORCO/VICODIN) 5-325 MG tablet Take 1 tablet by mouth 2 (two) times daily. 04/13/18  Yes Birdie Sons, MD  isosorbide dinitrate (ISORDIL) 30 MG tablet Take 1 tablet (30 mg total) by mouth 2 (two) times daily. 03/31/18  04/30/18 Yes Pyreddy, Reatha Harps, MD  Multiple Vitamins-Minerals (MULTIVITAMIN ADULT PO) Take 1 tablet by mouth daily. 07/17/08  Yes [provider]  potassium chloride (KLOR-CON 10) 10 MEQ tablet Take 1 tablet (10 mEq total) by mouth daily. 03/31/18 04/30/18 Yes Pyreddy, Reatha Harps, MD  pyridOXINE (B-6) 50 MG tablet Take 50 mg by mouth daily.   Yes [provider]  Skin Protectants, Misc. (EUCERIN) cream Apply topically as needed for dry skin.   Yes [provider]  tamsulosin (FLOMAX) 0.4 MG CAPS capsule Take 0.8 mg by mouth daily after supper.    Yes [provider]  triamcinolone ointment (KENALOG) 0.1 % Apply 1 application  topically 2 (two) times daily.   Yes [provider]  Vitamin E 400 UNITS TABS Take 1 tablet by mouth daily. 07/17/08  Yes [provider]    Review of Systems  Constitutional: Positive for appetite change (decreased). Negative for fatigue.  HENT: Positive for hearing loss. Negative for congestion.   Eyes: Negative.   Respiratory: Positive for shortness of breath. Negative for chest tightness.   Cardiovascular: Positive for palpitations. Negative for chest pain and leg swelling.  Gastrointestinal: Negative for abdominal distention and abdominal pain.  Endocrine: Negative.   Genitourinary: Negative.   Musculoskeletal: Positive for back pain. Negative for neck pain.  Skin: Negative.   Allergic/Immunologic: Negative.   Neurological: Negative for dizziness and light-headedness.  Hematological: Negative for adenopathy. Does not bruise/bleed easily.  Psychiatric/Behavioral: Negative for dysphoric mood and sleep disturbance (sleeping well). The patient is not nervous/anxious.    Vitals:   04/14/18 1312  BP: 109/67  Pulse: 82  Resp: 18  SpO2: 99%  Weight: 192 lb (87.1 kg)  Height: 5\' 10"  (1.778 m)   Wt Readings from Last 3 Encounters:  04/14/18 192 lb (87.1 kg)  04/12/18 191 lb (86.6 kg)  03/31/18 184 lb 8.4 oz (83.7 kg)   Lab Results  Component Value Date   CREATININE 0.73 (L) 04/12/2018   CREATININE 0.68 03/31/2018   CREATININE 0.49 (L) 03/30/2018    Physical Exam Vitals signs and nursing note reviewed.  Constitutional:      Appearance: He is well-developed.  HENT:     Head: Normocephalic and atraumatic.     Right Ear: Decreased hearing noted.     Left Ear: Decreased hearing noted.  Cardiovascular:     Rate and Rhythm: Normal rate. Rhythm irregular.  Pulmonary:     Effort: Pulmonary effort is normal.     Breath sounds: Normal breath sounds. No wheezing or rales.  Abdominal:     Palpations: Abdomen is soft.     Tenderness: There is no abdominal  tenderness.  Musculoskeletal:     Right lower leg: He exhibits no tenderness. No edema.     Left lower leg: He exhibits no tenderness. No edema.     Comments: Varicosity present bilateral lower legs with L>R  Skin:    General: Skin is warm and dry.  Neurological:     General: No focal deficit present.     Mental Status: He is alert and oriented to person, place, and time.  Psychiatric:        Behavior: Behavior normal.    Assessment & Plan:  1: Chronic heart failure with mildly reduced heart function- - NYHA class III - euvolemic today - weighing daily and he was instructed to call for an overnight weight gain of >2 pounds or a weekly weight gain of >5 pounds - not adding salt  to his food but doesn't read food labels as he doesn't cook much; he and his wife eat in the dining room at [employer] where they live - saw cardiology Nehemiah Massed) 04/11/18 - BP could not tolerate the addition of ACE-I or beta blocker at this time and EF is only mildly reduced at this time - BNP 03/29/18 was 221.0 - [facility] has [facility] health  2: Atrial fibrillation- - saw PCP Caryn Section) 04/12/18 - BMP 04/12/18 reviewed and showed sodium 136, potassium 4.9, creatinine 0.73 and GFR 78 - currently on apixaban  3: Venous insufficiency- - has quite a bit of varicose veins on bilateral lower legs  Medication list was reviewed.  Return here in 6 weeks or sooner for any questions/problems before then.

## 2018-04-14 NOTE — Patient Instructions (Signed)
Continue weighing daily and call for an overnight weight gain of > 2 pounds or a weekly weight gain of >5 pounds. 

## 2018-04-17 ENCOUNTER — Encounter: Payer: Self-pay | Admitting: Family

## 2018-04-17 DIAGNOSIS — I5022 Chronic systolic (congestive) heart failure: Secondary | ICD-10-CM | POA: Insufficient documentation

## 2018-04-17 DIAGNOSIS — I4821 Permanent atrial fibrillation: Secondary | ICD-10-CM | POA: Insufficient documentation

## 2018-04-17 DIAGNOSIS — I4891 Unspecified atrial fibrillation: Secondary | ICD-10-CM | POA: Insufficient documentation

## 2018-04-17 NOTE — Telephone Encounter (Signed)
Pt advised.   Thanks,   -Monico Sudduth  

## 2018-04-17 NOTE — Telephone Encounter (Signed)
That's fine

## 2018-05-09 ENCOUNTER — Other Ambulatory Visit: Payer: Self-pay | Admitting: Family Medicine

## 2018-05-25 NOTE — Progress Notes (Signed)
Patient ID: Douglas Calhoun, male    DOB: [birth year], 85 y.o.   MRN: [id number]  HPI  Douglas Calhoun is a 85 y/o male with a history of atrial fibrillation, prostate cancer, hearing loss and chronic heart failure.   Echo report from 03/30/18 showed an EF of 45-50% along with normal PA pressure  Admitted 03/29/18 due to acute HF. Cardiology consult obtained. Initially given IV lasix and then transitioned to oral diuretics. Had to have oxygen via nasal cannula to start with and then was able to go to room air. Discharged home after 2 days.   He presents today for a follow-up visit with a chief complaint of moderate shortness of breath upon minimal exertion. He describes this as chronic in nature having been present for several years. He has associated slight weight gain along with this. He denies any dizziness, difficulty sleeping, abdominal distention, palpitations, pedal edema, chest pain or fatigue.   Past Medical History:  Diagnosis Date  . Achilles tendinitis   . Arrhythmia    atrial fibrillation  . Cataract   . CHF (congestive heart failure) ([place])   . Hearing loss   . Herpes zoster without complication 2/99/2426  . Personal history of prostate cancer 10/08/2014   s/p 15 radiation treatments treated by Dr. Madelin Calhoun   . Varicose vein of leg    Past Surgical History:  Procedure Laterality Date  . APPENDECTOMY  1960's  . BREAST SURGERY     Breast Biopsy prior to 1960  . CATARACT EXTRACTION Left    Family History  Problem Relation Age of Onset  . Heart disease Mother   . Stroke Mother    Social History   Tobacco Use  . Smoking status: Never Smoker  . Smokeless tobacco: Never Used  Substance Use Topics  . Alcohol use: Yes    Alcohol/week: 0.0 standard drinks    Comment: drinks 2 beers a month   No Known Allergies  Prior to Admission medications   Medication Sig Start Date End Date Taking? Authorizing Provider  alendronate (FOSAMAX) 70 MG tablet Take 70 mg by mouth once a week.  Take with a full glass of water on an empty stomach on Saturdays.   Yes [provider]  apixaban (ELIQUIS) 5 MG TABS tablet Take 5 mg by mouth 2 (two) times daily.   Yes [provider]  atorvastatin (LIPITOR) 20 MG tablet Take 20 mg by mouth daily. 1/2 a pill at bedtime   Yes [provider]  Calcium 500 MG tablet Take 1 tablet by mouth daily. 07/17/08  Yes [provider]  camphor-menthol Timoteo Ace) lotion Apply 1 application topically as needed for itching.   Yes [provider]  ferrous sulfate 325 (65 FE) MG tablet Take 325 mg by mouth 3 (three) times daily.    Yes [provider]  furosemide (LASIX) 20 MG tablet Take 20 mg by mouth.   Yes [provider]  isosorbide dinitrate (ISORDIL) 30 MG tablet Take 1 tablet (30 mg total) by mouth 2 (two) times daily. 05/09/18  Yes Birdie Sons, MD  Multiple Vitamins-Minerals (MULTIVITAMIN ADULT PO) Take 1 tablet by mouth daily. 07/17/08  Yes [provider]  potassium chloride (K-DUR) 10 MEQ tablet TAKE 1 TABLET BY MOUTH EVERY DAY 05/09/18  Yes Birdie Sons, MD  pyridOXINE (B-6) 50 MG tablet Take 50 mg by mouth daily.   Yes [provider]  tamsulosin (FLOMAX) 0.4 MG CAPS capsule Take 0.8 mg by  mouth daily after supper.    Yes [provider]  Vitamin E 400 UNITS TABS Take 1 tablet by mouth daily. 07/17/08  Yes [provider]  diclofenac (FLECTOR) 1.3 % PTCH Apply one patch once or twice a day Patient not taking: Reported on 05/26/2018 04/12/18   Birdie Sons, MD    Review of Systems  Constitutional: Positive for appetite change (decreased). Negative for fatigue.  HENT: Positive for hearing loss. Negative for congestion.   Eyes: Negative.   Respiratory: Positive for shortness of breath ("little worse"). Negative for chest tightness.   Cardiovascular: Negative for chest pain, palpitations and leg swelling.  Gastrointestinal: Negative for abdominal  distention and abdominal pain.  Endocrine: Negative.   Genitourinary: Negative.   Musculoskeletal: Positive for back pain. Negative for neck pain.  Skin: Negative.   Allergic/Immunologic: Negative.   Neurological: Negative for dizziness and light-headedness.  Hematological: Negative for adenopathy. Does not bruise/bleed easily.  Psychiatric/Behavioral: Negative for dysphoric mood and sleep disturbance (sleeping well). The patient is not nervous/anxious.    Vitals:   05/26/18 1324  BP: 119/71  Pulse: 78  Resp: 18  SpO2: 99%  Weight: 196 lb 6 oz (89.1 kg)  Height: 5\' 10"  (1.778 m)   Wt Readings from Last 3 Encounters:  05/26/18 196 lb 6 oz (89.1 kg)  04/14/18 192 lb (87.1 kg)  04/12/18 191 lb (86.6 kg)   Lab Results  Component Value Date   CREATININE 0.73 (L) 04/12/2018   CREATININE 0.68 03/31/2018   CREATININE 0.49 (L) 03/30/2018    Physical Exam Vitals signs and nursing note reviewed.  Constitutional:      Appearance: He is well-developed.  HENT:     Head: Normocephalic and atraumatic.     Right Ear: Decreased hearing noted.     Left Ear: Decreased hearing noted.  Cardiovascular:     Rate and Rhythm: Normal rate. Rhythm irregular.  Pulmonary:     Effort: Pulmonary effort is normal.     Breath sounds: Normal breath sounds. No wheezing or rales.  Abdominal:     Palpations: Abdomen is soft.     Tenderness: There is no abdominal tenderness.  Musculoskeletal:     Right lower leg: He exhibits no tenderness. No edema.     Left lower leg: He exhibits no tenderness. No edema.     Comments: Varicosity present bilateral lower legs with L>R  Skin:    General: Skin is warm and dry.  Neurological:     General: No focal deficit present.     Mental Status: He is alert and oriented to person, place, and time.  Psychiatric:        Behavior: Behavior normal.    Assessment & Plan:  1: Chronic heart failure with mildly reduced heart function- - NYHA class III - euvolemic  today - weighing daily and he was reminded to call for an overnight weight gain of >2 pounds or a weekly weight gain of >5 pounds - weight up 4 pounds from last visit 6 weeks ago - not adding salt to his food but doesn't read food labels as he doesn't cook much; he and his wife eat in the dining room at [employer] where they live - saw cardiology Nehemiah Massed) 04/11/18 - BP could not tolerate the addition of ACE-I or beta blocker at this time and EF is >40% - BNP 03/29/18 was 221.0  2: Atrial fibrillation- - saw PCP Caryn Section) 04/12/18 - BMP 04/12/18 reviewed and showed sodium 136,  potassium 4.9, creatinine 0.73 and GFR 78 - currently on apixaban  Medication list was reviewed.  Will not make a return appointment at this time. Advised patient that he could call back at anytime to make another appointment.

## 2018-05-26 ENCOUNTER — Ambulatory Visit: Payer: Medicare Other | Attending: Family | Admitting: Family

## 2018-05-26 ENCOUNTER — Encounter: Payer: Self-pay | Admitting: Family

## 2018-05-26 VITALS — BP 119/71 | HR 78 | Resp 18 | Ht 70.0 in | Wt 196.4 lb

## 2018-05-26 DIAGNOSIS — I509 Heart failure, unspecified: Secondary | ICD-10-CM | POA: Diagnosis not present

## 2018-05-26 DIAGNOSIS — I480 Paroxysmal atrial fibrillation: Secondary | ICD-10-CM

## 2018-05-26 DIAGNOSIS — I5022 Chronic systolic (congestive) heart failure: Secondary | ICD-10-CM

## 2018-05-26 DIAGNOSIS — Z8546 Personal history of malignant neoplasm of prostate: Secondary | ICD-10-CM | POA: Diagnosis not present

## 2018-05-26 DIAGNOSIS — I4891 Unspecified atrial fibrillation: Secondary | ICD-10-CM | POA: Diagnosis not present

## 2018-05-26 DIAGNOSIS — Z8249 Family history of ischemic heart disease and other diseases of the circulatory system: Secondary | ICD-10-CM | POA: Diagnosis not present

## 2018-05-26 DIAGNOSIS — Z79899 Other long term (current) drug therapy: Secondary | ICD-10-CM | POA: Diagnosis not present

## 2018-05-26 DIAGNOSIS — Z7901 Long term (current) use of anticoagulants: Secondary | ICD-10-CM | POA: Diagnosis not present

## 2018-05-26 NOTE — Patient Instructions (Signed)
Continue weighing daily and call for an overnight weight gain of > 2 pounds or a weekly weight gain of >5 pounds. 

## 2018-08-07 DIAGNOSIS — E782 Mixed hyperlipidemia: Secondary | ICD-10-CM | POA: Diagnosis not present

## 2018-08-07 DIAGNOSIS — I482 Chronic atrial fibrillation, unspecified: Secondary | ICD-10-CM | POA: Diagnosis not present

## 2018-08-07 DIAGNOSIS — R001 Bradycardia, unspecified: Secondary | ICD-10-CM | POA: Diagnosis not present

## 2018-08-07 DIAGNOSIS — R600 Localized edema: Secondary | ICD-10-CM | POA: Diagnosis not present

## 2018-08-09 ENCOUNTER — Telehealth: Payer: Self-pay | Admitting: Family Medicine

## 2018-08-09 NOTE — Telephone Encounter (Signed)
Pt is having frequent urination during the night but not much during the day.  Wanting to know if he should change the time of the day that he is taking the flomax  CB#  [phone]  Thanks teri

## 2018-08-10 NOTE — Telephone Encounter (Signed)
Flomax should be taken in the evening, sometime between supper and bedtime.

## 2018-08-11 ENCOUNTER — Telehealth: Payer: Self-pay

## 2018-08-11 NOTE — Telephone Encounter (Signed)
Patient request call back from [facility], patient requesting referral to Urology. KW

## 2018-08-14 ENCOUNTER — Ambulatory Visit (INDEPENDENT_AMBULATORY_CARE_PROVIDER_SITE_OTHER): Payer: Medicare Other | Admitting: Physician Assistant

## 2018-08-14 ENCOUNTER — Encounter: Payer: Self-pay | Admitting: Physician Assistant

## 2018-08-14 ENCOUNTER — Telehealth: Payer: Self-pay

## 2018-08-14 ENCOUNTER — Other Ambulatory Visit: Payer: Self-pay

## 2018-08-14 VITALS — BP 118/68 | HR 59 | Temp 97.5°F | Resp 16 | Wt 195.4 lb

## 2018-08-14 DIAGNOSIS — N3000 Acute cystitis without hematuria: Secondary | ICD-10-CM

## 2018-08-14 DIAGNOSIS — R300 Dysuria: Secondary | ICD-10-CM

## 2018-08-14 DIAGNOSIS — I480 Paroxysmal atrial fibrillation: Secondary | ICD-10-CM | POA: Diagnosis not present

## 2018-08-14 DIAGNOSIS — J069 Acute upper respiratory infection, unspecified: Secondary | ICD-10-CM

## 2018-08-14 LAB — POCT URINALYSIS DIPSTICK
Bilirubin, UA: NEGATIVE
Blood, UA: NEGATIVE
Glucose, UA: NEGATIVE
Ketones, UA: NEGATIVE
Nitrite, UA: POSITIVE
Odor: POSITIVE
Protein, UA: NEGATIVE
Spec Grav, UA: 1.01
Urobilinogen, UA: 0.2 U/dL
pH, UA: 7.5

## 2018-08-14 MED ORDER — AMOXICILLIN-POT CLAVULANATE 875-125 MG PO TABS: 1.0000 | ORAL_TABLET | Freq: Two times a day (BID) | ORAL | 0 refills | Status: DC

## 2018-08-14 NOTE — Telephone Encounter (Signed)
Patient wants to come in for an office visit. He says for the past 2 days he has been spitting up blood tinged mucus in the mornings. He denies any cough, fever, shortness of breath, or any other symptoms. Patient is on Eliquis and is concerned. Patient does not have access to a computer of smart phone to do an E visit. Please advise if ok to schedule ov, or whether you have any recommendations for patient. Your schedule is full today, but other providers still have some openings.

## 2018-08-14 NOTE — Telephone Encounter (Signed)
Patient called back and confirmed that he wants a Urology referral due to urinary frequency. Is it ok to place referral?

## 2018-08-14 NOTE — Telephone Encounter (Signed)
Tried calling patient. Left message to call back. Need to know what the Urology referral is for. Urinary frequency? Please ask patient when he returns my call.

## 2018-08-14 NOTE — Progress Notes (Signed)
Patient: Douglas Calhoun Male    DOB: Aug 07, 1919   85 y.o.   MRN: [id number] Visit Date: 08/14/2018  Today's Provider: Mar Daring, PA-C   Chief Complaint  Patient presents with  . Dysuria   Subjective:     HPI  Patient here today with c/o frequent urination at night. This is an ongoing problem that has gotten worse. Reports that he gets up 6-7 times at night to urinate, and when he urinates only a small amount comes out. Reports that during the day he has urgency but is a little stream but at night is more stream. Patient takes Tamsulosin.  Patient also requesting antibiotic to cover for respiratory infection. No fever or SOB. Had some hemoptysis (streaked in sputum).   No Known Allergies   Current Outpatient Medications:  .  alendronate (FOSAMAX) 70 MG tablet, Take 70 mg by mouth once a week. Take with a full glass of water on an empty stomach on Saturdays., Disp: , Rfl:  .  apixaban (ELIQUIS) 5 MG TABS tablet, Take 5 mg by mouth 2 (two) times daily., Disp: , Rfl:  .  atorvastatin (LIPITOR) 20 MG tablet, Take 20 mg by mouth daily. 1/2 a pill at bedtime, Disp: , Rfl:  .  Calcium 500 MG tablet, Take 1 tablet by mouth daily., Disp: , Rfl:  .  camphor-menthol (SARNA) lotion, Apply 1 application topically as needed for itching., Disp: , Rfl:  .  ferrous sulfate 325 (65 FE) MG tablet, Take 325 mg by mouth 3 (three) times daily. , Disp: , Rfl:  .  furosemide (LASIX) 20 MG tablet, Take 20 mg by mouth., Disp: , Rfl:  .  isosorbide dinitrate (ISORDIL) 30 MG tablet, Take 1 tablet (30 mg total) by mouth 2 (two) times daily., Disp: 60 tablet, Rfl: 11 .  Multiple Vitamins-Minerals (MULTIVITAMIN ADULT PO), Take 1 tablet by mouth daily., Disp: , Rfl:  .  potassium chloride (K-DUR) 10 MEQ tablet, TAKE 1 TABLET BY MOUTH EVERY DAY, Disp: 30 tablet, Rfl: 11 .  pyridOXINE (B-6) 50 MG tablet, Take 50 mg by mouth daily., Disp: , Rfl:  .  tamsulosin (FLOMAX) 0.4 MG CAPS capsule, Take 0.8  mg by mouth daily after supper. , Disp: , Rfl:  .  Vitamin E 400 UNITS TABS, Take 1 tablet by mouth daily., Disp: , Rfl:  .  diclofenac (FLECTOR) 1.3 % PTCH, Apply one patch once or twice a day (Patient not taking: Reported on 05/26/2018), Disp: 30 patch, Rfl: 1  Review of Systems  Constitutional: Negative.  Negative for fever.  HENT: Positive for congestion and postnasal drip.   Respiratory: Positive for cough. Negative for chest tightness, shortness of breath and wheezing.   Cardiovascular: Negative.   Gastrointestinal: Negative.   Genitourinary: Positive for decreased urine volume, dysuria and enuresis.  Neurological: Negative.     Social History   Tobacco Use  . Smoking status: Never Smoker  . Smokeless tobacco: Never Used  Substance Use Topics  . Alcohol use: Yes    Alcohol/week: 0.0 standard drinks    Comment: drinks 2 beers a month      Objective:   BP 118/68 (BP Location: Left Arm, Patient Position: Sitting, Cuff Size: Large)   Pulse (!) 59   Temp (!) 97.5 F (36.4 C) (Oral)   Resp 16   Wt 195 lb 6.4 oz (88.6 kg)   BMI 28.04 kg/m  Vitals:   08/14/18 1415  BP: 118/68  Pulse: (!) 59  Resp: 16  Temp: (!) 97.5 F (36.4 C)  TempSrc: Oral  Weight: 195 lb 6.4 oz (88.6 kg)     Physical Exam Vitals signs reviewed.  Constitutional:      General: He is not in acute distress.    Appearance: Normal appearance. He is well-developed. He is not ill-appearing or diaphoretic.  HENT:     Head: Normocephalic and atraumatic.     Right Ear: Hearing, tympanic membrane, ear canal and external ear normal. No middle ear effusion. Tympanic membrane is not erythematous or bulging.     Left Ear: Hearing, tympanic membrane, ear canal and external ear normal.  No middle ear effusion. Tympanic membrane is not erythematous or bulging.     Nose: Nose normal. No mucosal edema, congestion or rhinorrhea.     Right Sinus: No maxillary sinus tenderness or frontal sinus tenderness.     Left  Sinus: No maxillary sinus tenderness or frontal sinus tenderness.     Mouth/Throat:     Mouth: Mucous membranes are moist.     Pharynx: Uvula midline. No oropharyngeal exudate or posterior oropharyngeal erythema.  Eyes:     General:        Right eye: No discharge.        Left eye: No discharge.     Conjunctiva/sclera: Conjunctivae normal.     Pupils: Pupils are equal, round, and reactive to light.  Neck:     Musculoskeletal: Normal range of motion and neck supple.     Thyroid: No thyromegaly.     Trachea: No tracheal deviation.     Meningeal: Brudzinski's sign and Kernig's sign absent.  Cardiovascular:     Rate and Rhythm: Normal rate and regular rhythm.     Heart sounds: Normal heart sounds. No murmur. No friction rub. No gallop.   Pulmonary:     Effort: Pulmonary effort is normal. No respiratory distress.     Breath sounds: Normal breath sounds. No stridor. No wheezing or rales.  Abdominal:     General: Abdomen is flat.     Palpations: Abdomen is soft.     Tenderness: There is no abdominal tenderness. There is no right CVA tenderness or left CVA tenderness.  Lymphadenopathy:     Cervical: No cervical adenopathy.  Skin:    General: Skin is warm and dry.  Neurological:     Mental Status: He is alert.         Assessment & Plan    1. Acute cystitis without hematuria Worsening symptoms. UA positive. Will treat empirically with Augmentin as below.  Continue to push fluids. Urine sent for culture. Will follow up pending C&S results. She is to call if symptoms do not improve or if they worsen.  - amoxicillin-clavulanate (AUGMENTIN) 875-125 MG tablet; Take 1 tablet by mouth 2 (two) times daily.  Dispense: 20 tablet; Refill: 0  2. Dysuria UA positive. - POCT urinalysis dipstick - Urine Culture  3. Upper respiratory tract infection, unspecified type Will use augmentin to cover URI as well as UTI. Lung sounds were ok today in office but patient has noticed some increased cough  at night and in the morning. Has had a few episodes of blood streaked sputum. Will hold eliquis x 2 days for hemoptysis. Patient agreeable.  - amoxicillin-clavulanate (AUGMENTIN) 875-125 MG tablet; Take 1 tablet by mouth 2 (two) times daily.  Dispense: 20 tablet; Refill: 0  4. Paroxysmal atrial fibrillation (HCC) Hold Eliquis x 2 days for hemoptysis.  Douglas Daring, PA-C  [facility]

## 2018-08-14 NOTE — Telephone Encounter (Signed)
I advised patient as below.  He has not had any shortness of breath or fever. Patient would like to try an antibiotic to cover for respiratory infection. He states the urinary frequency is an ongoing problem that has gotten worse. He gets up 6-7 times at night to urinate, and when he urinates only a small mount comes out. He has had some burning and stinging when he urinates. I scheduled patient an appointment to come in today at 2pm with Tawanna Sat to have urine checked.    I advised patient that I would hold off on sending in an antibiotic for possible respiratory infection since he is coming for an appointment at 2pm. Patient verbalized understanding.

## 2018-08-14 NOTE — Telephone Encounter (Signed)
Please see todays other message

## 2018-08-14 NOTE — Patient Instructions (Signed)
Urinary Tract Infection, Adult A urinary tract infection (UTI) is an infection of any part of the urinary tract. The urinary tract includes:  The kidneys.  The ureters.  The bladder.  The urethra. These organs make, store, and get rid of pee (urine) in the body. What are the causes? This is caused by germs (bacteria) in your genital area. These germs grow and cause swelling (inflammation) of your urinary tract. What increases the risk? You are more likely to develop this condition if:  You have a small, thin tube (catheter) to drain pee.  You cannot control when you pee or poop (incontinence).  You are male, and: ? You use these methods to prevent pregnancy: ? A medicine that kills sperm (spermicide). ? A device that blocks sperm (diaphragm). ? You have low levels of a male hormone (estrogen). ? You are pregnant.  You have genes that add to your risk.  You are sexually active.  You take antibiotic medicines.  You have trouble peeing because of: ? A prostate that is bigger than normal, if you are male. ? A blockage in the part of your body that drains pee from the bladder (urethra). ? A kidney stone. ? A nerve condition that affects your bladder (neurogenic bladder). ? Not getting enough to drink. ? Not peeing often enough.  You have other conditions, such as: ? Diabetes. ? A weak disease-fighting system (immune system). ? Sickle cell disease. ? Gout. ? Injury of the spine. What are the signs or symptoms? Symptoms of this condition include:  Needing to pee right away (urgently).  Peeing often.  Peeing small amounts often.  Pain or burning when peeing.  Blood in the pee.  Pee that smells bad or not like normal.  Trouble peeing.  Pee that is cloudy.  Fluid coming from the vagina, if you are male.  Pain in the belly or lower back. Other symptoms include:  Throwing up (vomiting).  No urge to eat.  Feeling mixed up (confused).  Being tired  and grouchy (irritable).  A fever.  Watery poop (diarrhea). How is this treated? This condition may be treated with:  Antibiotic medicine.  Other medicines.  Drinking enough water. Follow these instructions at home:  Medicines  Take over-the-counter and prescription medicines only as told by your doctor.  If you were prescribed an antibiotic medicine, take it as told by your doctor. Do not stop taking it even if you start to feel better. General instructions  Make sure you: ? Pee until your bladder is empty. ? Do not hold pee for a long time. ? Empty your bladder after sex. ? Wipe from front to back after pooping if you are a male. Use each tissue one time when you wipe.  Drink enough fluid to keep your pee pale yellow.  Keep all follow-up visits as told by your doctor. This is important. Contact a doctor if:  You do not get better after 1-2 days.  Your symptoms go away and then come back. Get help right away if:  You have very bad back pain.  You have very bad pain in your lower belly.  You have a fever.  You are sick to your stomach (nauseous).  You are throwing up. Summary  A urinary tract infection (UTI) is an infection of any part of the urinary tract.  This condition is caused by germs in your genital area.  There are many risk factors for a UTI. These include having a small, thin   tube to drain pee and not being able to control when you pee or poop.  Treatment includes antibiotic medicines for germs.  Drink enough fluid to keep your pee pale yellow. This information is not intended to replace advice given to you by your health care provider. Make sure you discuss any questions you have with your health care provider. Document Released: 09/29/2007 Document Revised: 10/20/2017 Document Reviewed: 10/20/2017 Elsevier Interactive Patient Education  2019 Elsevier Inc[employer]  

## 2018-08-14 NOTE — Telephone Encounter (Signed)
If he is not having any shortness of breath fever then we can send in prescription for azithromycin to cover for possible respiratory infection. He should use stop Eliquis for 2 days.  Need more information about urinary frequency. Is this something new or just worsening of previous problems with urinary retention. If he is having any burning, stinging, or abdominal pain then we will need to get urine sample. Otherwise we can refer to new urologist.

## 2018-08-16 ENCOUNTER — Telehealth: Payer: Self-pay | Admitting: *Deleted

## 2018-08-16 DIAGNOSIS — R32 Unspecified urinary incontinence: Secondary | ICD-10-CM

## 2018-08-16 LAB — URINE CULTURE

## 2018-08-16 NOTE — Telephone Encounter (Signed)
Patient was notified of results. Expressed understanding. Patient states be believes the antibiotic is working. He is not having burning sensation any longer. Patient wanted to let Tawanna Sat know that when he urinates during the day, he only empties out about a tablespoon of urine at a time. Patient states after he uses the catheter before bedtime, he has to urinate up to 5 times a night. Patient states when he empties out at night it can be as much as a cup full of urine. He wanted to know why this is happening? Please advise?

## 2018-08-16 NOTE — Telephone Encounter (Signed)
-----   Message from Mar Daring, [place] sent at 08/16/2018  1:41 PM EDT ----- Urine culture was positive for a bacteria that is common from catheterizations. The antibiotic I have placed you on may or may not work. Have you been improving with the antibiotic? If so, continue until completed and call if urinary symptoms return. If not, I will change antibiotic for you.

## 2018-08-17 NOTE — Telephone Encounter (Signed)
Patient was advised. Referral to urology in [place].

## 2018-08-17 NOTE — Telephone Encounter (Signed)
I am not sure unfortunately. It most likely has something to do with the prostate. I know Dr. Caryn Section had mentioned referring to a Urologist and I think that would be beneficial. If agreeable I will place referral.

## 2018-11-16 DIAGNOSIS — R42 Dizziness and giddiness: Secondary | ICD-10-CM | POA: Diagnosis not present

## 2018-11-16 DIAGNOSIS — I482 Chronic atrial fibrillation, unspecified: Secondary | ICD-10-CM | POA: Diagnosis not present

## 2018-11-16 DIAGNOSIS — E782 Mixed hyperlipidemia: Secondary | ICD-10-CM | POA: Diagnosis not present

## 2018-11-16 DIAGNOSIS — R600 Localized edema: Secondary | ICD-10-CM | POA: Diagnosis not present

## 2018-11-16 DIAGNOSIS — R001 Bradycardia, unspecified: Secondary | ICD-10-CM | POA: Diagnosis not present

## 2018-11-22 ENCOUNTER — Other Ambulatory Visit: Payer: Self-pay

## 2018-11-22 ENCOUNTER — Ambulatory Visit: Payer: Medicare Other | Admitting: Urology

## 2018-11-22 ENCOUNTER — Encounter: Payer: Self-pay | Admitting: Urology

## 2018-11-22 VITALS — BP 142/78 | HR 45 | Ht 68.0 in | Wt 188.0 lb

## 2018-11-22 DIAGNOSIS — R32 Unspecified urinary incontinence: Secondary | ICD-10-CM

## 2018-11-22 DIAGNOSIS — N401 Enlarged prostate with lower urinary tract symptoms: Secondary | ICD-10-CM

## 2018-11-22 DIAGNOSIS — C61 Malignant neoplasm of prostate: Secondary | ICD-10-CM

## 2018-11-22 DIAGNOSIS — N138 Other obstructive and reflux uropathy: Secondary | ICD-10-CM

## 2018-11-22 LAB — URINALYSIS, COMPLETE
Bilirubin, UA: NEGATIVE
Glucose, UA: NEGATIVE
Ketones, UA: NEGATIVE
Nitrite, UA: POSITIVE — AB
Protein,UA: NEGATIVE
RBC, UA: NEGATIVE
Specific Gravity, UA: 1.015 (ref 1.005–1.030)
Urobilinogen, Ur: 0.2 mg/dL (ref 0.2–1.0)
pH, UA: 7 (ref 5.0–7.5)

## 2018-11-22 LAB — MICROSCOPIC EXAMINATION
Epithelial Cells (non renal): NONE SEEN /hpf (ref 0–10)
RBC, Urine: NONE SEEN /hpf (ref 0–2)

## 2018-11-22 NOTE — Progress Notes (Signed)
11/22/18 10:01 AM   Douglas Calhoun 1919-12-16 [id number]  Referring provider: Birdie Sons, MD [address] [place],  [place] [address]  CC: BPH/LUTS  HPI: I saw Mr. Douglas Calhoun in urology clinic today in consultation for BPH and urinary symptoms from Dr. Caryn Section.  He is a 85 year old comorbid male with past medical history notable for atrial fibrillation, CHF, anticoagulation with Eliquis, and reported history of prostate cancer treated with radiation over 30 years ago.  He was seen by Dr. Louis Meckel in this office in July 2017 for follow-up of urinary retention, and passed a void trial.  He reportedly has been taking Flomax since that time.  Apparently he is also been followed regularly at the urology clinic at the [facility], however these records are unavailable to me.  The patient is a very poor historian, but it sounds like they recommended clean intermittent catheterization(CIC) 2-3 times daily for his incomplete emptying.  He is only performing CIC in the evenings before bed.  Overall he is mildly bothered by his urinary symptoms.  It sounds like he occasionally will have some urgency and some lower abdominal pressure during the day, as well as some mild leakage of a few drops of urine overnight.  He has nocturia 1-2 times per night as well.  He is unsure how much urine he gets when he catheterizes in the evenings.  He denies any gross hematuria, urinary tract infections, dysuria, or flank pain.  Renal function was normal in December 2019 with creatinine of 0.73, EGFR greater than 60.  He is very resistant to increasing his catheterization to 2-3 times per day.  There are no aggravating or alleviating factors.  Severity is mild.  He lives in assisted living here in [place].  PVR in clinic today 200 cc.  Urinalysis with 11-30 WBCs, 0 RBCs, few bacteria, no yeast, and nitrite positive, consistent with use of CIC.   PMH: Past Medical History:  Diagnosis Date  . Achilles tendinitis    . Arrhythmia    atrial fibrillation  . Cataract   . CHF (congestive heart failure) ([place])   . Hearing loss   . Herpes zoster without complication 3/97/6734  . Personal history of prostate cancer 10/08/2014   s/p 15 radiation treatments treated by Dr. Madelin Headings   . Varicose vein of leg     Surgical History: Past Surgical History:  Procedure Laterality Date  . APPENDECTOMY  1960's  . BREAST SURGERY     Breast Biopsy prior to 1960  . CATARACT EXTRACTION Left    Allergies: No Known Allergies  Family History: Family History  Problem Relation Age of Onset  . Heart disease Mother   . Stroke Mother     Social History:  reports that he has never smoked. He has never used smokeless tobacco. He reports current alcohol use. He reports that he does not use drugs.  ROS: Please see flowsheet from today's date for complete review of systems.  Physical Exam: BP (!) 142/78 (BP Location: Left Arm, Patient Position: Sitting)   Pulse (!) 45   Ht _0  (1.727 m)   Wt 188 lb (85.3 kg)   BMI 28.59 kg/m    Constitutional:  Alert and oriented, No acute distress.  Frail-appearing Cardiovascular: No clubbing, cyanosis, or edema. Respiratory: Normal respiratory effort, no increased work of breathing. GI: Abdomen is soft, nontender, nondistended, no abdominal masses Lymph: No cervical or inguinal lymphadenopathy. Skin: No rashes, bruises or suspicious lesions. Neurologic: Grossly intact,  no focal deficits, moving all 4 extremities. Psychiatric: Normal mood and affect.  Laboratory Data: Reviewed  Pertinent Imaging: None to review  Assessment & Plan:   In summary, the patient is a comorbid and frail-appearing 85 year old male with distant history of prostate cancer treated with radiation, and history of BPH with incomplete emptying previously managed at the [facility] in [place] with intermittent catheterization in the evening.  He really only has mild complaints today of his occasional urgency during  the day and nocturia 1-2 times at night.  He has minimal leakage of just a few drops of urine in the evening.  In the setting of his age, co-morbidities, and normal renal function he should continue clean intermittent catheterization.  I did recommend considering catheterizing 2-3 times per day to see if this improves some of his urgency, as I sense he is not emptying completely which may be causing some of his urgency.  He is amenable to trying CIC twice per day.  We discussed return precautions including fevers, chills, dysuria, gross hematuria, or inability to pass catheter.  Increase CIC to twice daily Follow-up in 6 months with PVR and symptom check  A total of 45 minutes were spent face-to-face with the patient, greater than 50% was spent in patient education, counseling, and coordination of care regarding BPH, history of prostate cancer, and urinary symptoms.   Billey Co, [facility] [address], [place], [place] [address] [phone]

## 2018-11-22 NOTE — Patient Instructions (Signed)
Clean Intermittent Catheterization, Male  Clean intermittent catheterization (CIC) is a procedure to remove urine from the bladder by placing a small, flexible tube (catheter) into the bladder though the urethra. The urethra is a tube in the body that carries urine from the bladder out of the body. CIC may be done when:  You cannot completely empty your bladder on your own. This may be due to a blockage in the bladder or urethra.  Your bladder leaks urine. This may happen when the muscles or nerves near the bladder are not working normally, so the bladder overflows. Your health care provider will show you how to perform CIC and will help you to become comfortable performing this procedure at home. Your health care provider will also help you to get the home care supplies that are needed for this procedure. Supplies needed:  Germ-free (sterile), water-based lubricant.  A container for urine collection. You may also use the toilet to dispose of urine from the catheter.  A catheter. Your health care provider will determine the best size for you. ? Use this catheter size: ______________________________  Sterile gloves.  Sterile gauze.  Medicated sterile swabs. How to perform this procedure: Most people need CIC at least 4 times per day to adequately empty the bladder. Your health care provider will tell you how often you should perform CIC.  Number of times per day to perform CIC: ______________________________________________________________________ To perform CIC, follow these steps: 1. Wash your hands with soap and water. If soap and water are not available, use hand sanitizer. 2. Prepare the supplies that you will use during the procedure. Open the catheter pack, the lubricant, and the pack of medicated sterile swabs. If you have been told to keep the procedure sterile, do not touch your supplies until you are wearing gloves. 3. Get in a comfortable position. Possible positions include:  ? Sitting on a toilet, a chair, or the edge of a bed. ? Standing near a toilet. ? Lying down with your head raised on pillows and your knees pointing to the ceiling. You may wish to place a waterproof mat or pad under you. 4. If you are using a urine collection container, position it between your legs. 5. Urinate, if you are able. 6. Put on gloves. 7. Apply lubricant to about 2 inches (5 cm) of the tip of the catheter. 8. Set the catheter down on a clean, dry surface within reach. 9. Gently stretch your penis out from your body. Pull back any skin that covers the end of your penis (foreskin). Clean the end of your penis with medicated sterile swabs as told by your health care provider. 10. Hold your penis upward at a 45-60 degree angle. This helps to straighten the urethra. 11. Slowly insert the lubricated catheter straight into your urethra until urine flows freely. This is usually about 6-8 inches (15-20 cm). 12. When urine starts to flow freely, insert the catheter 1 inch (3 cm) more. Allow urine to drain into the toilet or the urine collection container. 13. When urine stops flowing, slowly remove the catheter. 14. Note the color, amount, and odor of the urine. 15. Measure your urine and note the amount, if told by your health care provider. 16. Discard the urine in the toilet. 47. Clean your penis using soap and water. 18. Move the foreskin back in place, if applicable. 19. If you are using a single-use catheter, discard the catheter and supplies. 58. Wash your hands with soap and water. 21.  If you are using a reusable catheter, follow package instructions about how to clean the catheter after each use. How often should I perform this procedure?  Do CIC to empty your bladder every 4-6 hours or as often as told by your health care provider.  If you have symptoms of too much urine in your bladder (overdistension) and you are not able to urinate, perform CIC. Symptoms of overdistension  may include: ? Restlessness. ? Sweating or chills. ? Headache. ? Flushed or pale skin. ? Bloated lower abdomen. What are the risks? Generally, this is a safe procedure, however problems may occur, including:  Infection.  Injury to the urethra.  Irritation of the urethra. Follow these instructions at home  General instructions  Drink enough fluid to keep your urine pale yellow.  Dispose of a multiple use catheter when it becomes dry, brittle, or cloudy. This usually happens after you use the catheter for 1 week.  Avoid caffeine. Caffeine may make you need to urinate more frequently and more urgently.  When traveling, bring extra supplies with you in case of delays. Keep supplies with you in a place that you can access easily. If traveling by plane: ? Make sure that the lubricant in your carry-on bag is less than 3.4 ounces (100 mL). ? Use a single-use catheter. It may be difficult to clean a reusable catheter in a small bathroom.  Take over-the-counter and prescription medicines only as told by your health care provider.  Keep all follow-up visits as told by your health care provider. This is important. Contact a health care provider if you:  Have difficulty performing CIC.  Have urine leaking during CIC.  Have: ? Dark or cloudy urine. ? Blood in your urine or in your catheter. ? A change in the smell of your urine or discharge. ? A burning feeling while you urinate.  Feel nauseous or you vomit.  Have pain in your abdomen, your back, or your sides below your ribs.  Have swelling or redness around the opening of your urethra.  Develop a rash or sores on your skin. Get help right away if you have:  A fever.  Symptoms that do not go away after 3 days.  Symptoms that suddenly get worse.  Severe pain.  A decrease in the amount of urine that drains from your bladder. Summary  Clean intermittent catheterization (CIC) is a procedure to remove urine from the  bladder by placing a small, flexible tube (catheter) into the bladder though the urethra.  Your health care provider will show you how to perform CIC and will help you to become comfortable performing this procedure at home.  Most people need CIC at least 4 times per day to adequately empty the bladder. This information is not intended to replace advice given to you by your health care provider. Make sure you discuss any questions you have with your health care provider. Document Released: 05/15/2010 Document Revised: 08/02/2018 Document Reviewed: 12/01/2017 Elsevier Patient Education  2020 [employer].

## 2018-11-24 DIAGNOSIS — I482 Chronic atrial fibrillation, unspecified: Secondary | ICD-10-CM | POA: Diagnosis not present

## 2018-11-24 DIAGNOSIS — R001 Bradycardia, unspecified: Secondary | ICD-10-CM | POA: Diagnosis not present

## 2018-12-12 DIAGNOSIS — R600 Localized edema: Secondary | ICD-10-CM | POA: Diagnosis not present

## 2018-12-12 DIAGNOSIS — I482 Chronic atrial fibrillation, unspecified: Secondary | ICD-10-CM | POA: Diagnosis not present

## 2018-12-12 DIAGNOSIS — R001 Bradycardia, unspecified: Secondary | ICD-10-CM | POA: Diagnosis not present

## 2018-12-12 DIAGNOSIS — I495 Sick sinus syndrome: Secondary | ICD-10-CM | POA: Diagnosis not present

## 2018-12-18 DIAGNOSIS — H2511 Age-related nuclear cataract, right eye: Secondary | ICD-10-CM | POA: Diagnosis not present

## 2018-12-27 DIAGNOSIS — Z01818 Encounter for other preprocedural examination: Secondary | ICD-10-CM | POA: Diagnosis not present

## 2018-12-27 DIAGNOSIS — J9811 Atelectasis: Secondary | ICD-10-CM | POA: Diagnosis not present

## 2018-12-27 DIAGNOSIS — R001 Bradycardia, unspecified: Secondary | ICD-10-CM | POA: Diagnosis not present

## 2019-01-08 ENCOUNTER — Other Ambulatory Visit: Payer: Self-pay

## 2019-01-08 ENCOUNTER — Other Ambulatory Visit
Admission: RE | Admit: 2019-01-08 | Discharge: 2019-01-08 | Disposition: A | Discharge status: Home / Self Care | Payer: Medicare Other | Source: Ambulatory Visit | Attending: Cardiology | Admitting: Cardiology

## 2019-01-08 ENCOUNTER — Other Ambulatory Visit: Payer: Self-pay | Admitting: Family Medicine

## 2019-01-08 DIAGNOSIS — Z01812 Encounter for preprocedural laboratory examination: Secondary | ICD-10-CM | POA: Insufficient documentation

## 2019-01-08 DIAGNOSIS — Z20828 Contact with and (suspected) exposure to other viral communicable diseases: Secondary | ICD-10-CM | POA: Diagnosis not present

## 2019-01-09 LAB — SARS CORONAVIRUS 2 (TAT 6-24 HRS): SARS Coronavirus 2: NEGATIVE

## 2019-01-11 ENCOUNTER — Other Ambulatory Visit: Payer: Self-pay

## 2019-01-11 ENCOUNTER — Encounter: Payer: Self-pay | Admitting: *Deleted

## 2019-01-11 ENCOUNTER — Encounter
Admission: AD | Disposition: A | Discharge status: Home / Self Care | Payer: Self-pay | Source: Home / Self Care | Attending: Cardiology

## 2019-01-11 ENCOUNTER — Observation Stay
Admission: AD | Admit: 2019-01-11 | Discharge: 2019-01-12 | Disposition: A | Discharge status: Home / Self Care | Payer: Medicare Other | Attending: Cardiology | Admitting: Cardiology

## 2019-01-11 DIAGNOSIS — Z7982 Long term (current) use of aspirin: Secondary | ICD-10-CM | POA: Insufficient documentation

## 2019-01-11 DIAGNOSIS — Z87891 Personal history of nicotine dependence: Secondary | ICD-10-CM | POA: Insufficient documentation

## 2019-01-11 DIAGNOSIS — Z006 Encounter for examination for normal comparison and control in clinical research program: Secondary | ICD-10-CM | POA: Diagnosis not present

## 2019-01-11 DIAGNOSIS — I10 Essential (primary) hypertension: Secondary | ICD-10-CM | POA: Diagnosis not present

## 2019-01-11 DIAGNOSIS — I495 Sick sinus syndrome: Secondary | ICD-10-CM | POA: Diagnosis not present

## 2019-01-11 DIAGNOSIS — Z7901 Long term (current) use of anticoagulants: Secondary | ICD-10-CM | POA: Insufficient documentation

## 2019-01-11 DIAGNOSIS — I482 Chronic atrial fibrillation, unspecified: Secondary | ICD-10-CM | POA: Diagnosis not present

## 2019-01-11 DIAGNOSIS — Z79899 Other long term (current) drug therapy: Secondary | ICD-10-CM | POA: Diagnosis not present

## 2019-01-11 DIAGNOSIS — R600 Localized edema: Secondary | ICD-10-CM | POA: Insufficient documentation

## 2019-01-11 DIAGNOSIS — R001 Bradycardia, unspecified: Secondary | ICD-10-CM | POA: Diagnosis not present

## 2019-01-11 HISTORY — PX: PACEMAKER LEADLESS INSERTION: EP1219

## 2019-01-11 LAB — SURGICAL PCR SCREEN
MRSA, PCR: NEGATIVE
Staphylococcus aureus: NEGATIVE

## 2019-01-11 LAB — GLUCOSE, CAPILLARY: Glucose-Capillary: 132 mg/dL — ABNORMAL HIGH (ref 70–99)

## 2019-01-11 SURGERY — PACEMAKER LEADLESS INSERTION
Anesthesia: Moderate Sedation

## 2019-01-11 MED ORDER — HEPARIN (PORCINE) IN NACL 1000-0.9 UT/500ML-% IV SOLN
INTRAVENOUS | Status: DC | PRN
  Administered 2019-01-11 (×2): 500 mL

## 2019-01-11 MED ORDER — FENTANYL CITRATE (PF) 100 MCG/2ML IJ SOLN
INTRAMUSCULAR | Status: AC
  Filled 2019-01-11: qty 2

## 2019-01-11 MED ORDER — IOHEXOL 300 MG/ML  SOLN
INTRAMUSCULAR | Status: DC | PRN
  Administered 2019-01-11: 45 mL via INTRAVENOUS

## 2019-01-11 MED ORDER — HEPARIN (PORCINE) IN NACL 1000-0.9 UT/500ML-% IV SOLN
INTRAVENOUS | Status: AC
  Filled 2019-01-11: qty 1000

## 2019-01-11 MED ORDER — DOCUSATE SODIUM 100 MG PO CAPS
100.0000 mg | ORAL_CAPSULE | Freq: Every day | ORAL | Status: DC | PRN
  Administered 2019-01-11: 100 mg via ORAL
  Filled 2019-01-11: qty 1

## 2019-01-11 MED ORDER — ISOSORBIDE MONONITRATE ER 30 MG PO TB24
30.0000 mg | ORAL_TABLET | Freq: Every day | ORAL | Status: DC
  Administered 2019-01-11: 30 mg via ORAL
  Filled 2019-01-11: qty 1

## 2019-01-11 MED ORDER — HEPARIN SODIUM (PORCINE) 1000 UNIT/ML IJ SOLN
INTRAMUSCULAR | Status: AC
  Filled 2019-01-11: qty 1

## 2019-01-11 MED ORDER — SODIUM CHLORIDE 0.9% FLUSH: 3.0000 mL | INTRAVENOUS | Status: DC | PRN

## 2019-01-11 MED ORDER — ONDANSETRON HCL 4 MG/2ML IJ SOLN: 4.0000 mg | Freq: Four times a day (QID) | INTRAMUSCULAR | Status: DC | PRN

## 2019-01-11 MED ORDER — SODIUM CHLORIDE 0.9 % IV SOLN: 250.0000 mL | INTRAVENOUS | Status: DC | PRN

## 2019-01-11 MED ORDER — ATORVASTATIN CALCIUM 20 MG PO TABS
20.0000 mg | ORAL_TABLET | Freq: Every day | ORAL | Status: DC
  Administered 2019-01-11: 20 mg via ORAL
  Filled 2019-01-11: qty 1

## 2019-01-11 MED ORDER — MIDAZOLAM HCL 2 MG/2ML IJ SOLN
INTRAMUSCULAR | Status: DC | PRN
  Administered 2019-01-11: 1 mg via INTRAVENOUS
  Administered 2019-01-11: 0.5 mg via INTRAVENOUS

## 2019-01-11 MED ORDER — CHLORHEXIDINE GLUCONATE CLOTH 2 % EX PADS
6.0000 | MEDICATED_PAD | Freq: Every day | CUTANEOUS | Status: DC
  Administered 2019-01-11: 6 via TOPICAL

## 2019-01-11 MED ORDER — ALENDRONATE SODIUM 10 MG PO TABS: 70.0000 mg | ORAL_TABLET | Freq: Every day | ORAL | Status: DC

## 2019-01-11 MED ORDER — SODIUM CHLORIDE 0.45 % IV SOLN: INTRAVENOUS | Status: DC

## 2019-01-11 MED ORDER — ACETAMINOPHEN 325 MG PO TABS: 650.0000 mg | ORAL_TABLET | ORAL | Status: DC | PRN

## 2019-01-11 MED ORDER — PNEUMOCOCCAL VAC POLYVALENT 25 MCG/0.5ML IJ INJ: 0.5000 mL | INJECTION | INTRAMUSCULAR | Status: DC

## 2019-01-11 MED ORDER — DOXYCYCLINE HYCLATE 100 MG PO TABS
100.0000 mg | ORAL_TABLET | Freq: Two times a day (BID) | ORAL | Status: DC
  Administered 2019-01-11: 100 mg via ORAL
  Filled 2019-01-11: qty 1

## 2019-01-11 MED ORDER — CALCIUM CARBONATE ANTACID 500 MG PO CHEW
500.0000 mg | CHEWABLE_TABLET | Freq: Every day | ORAL | Status: DC
  Administered 2019-01-12: 500 mg via ORAL
  Filled 2019-01-11: qty 3

## 2019-01-11 MED ORDER — SODIUM CHLORIDE 0.9 % IV SOLN: INTRAVENOUS | Status: DC

## 2019-01-11 MED ORDER — FERROUS SULFATE 325 (65 FE) MG PO TABS
325.0000 mg | ORAL_TABLET | Freq: Every day | ORAL | Status: DC
  Administered 2019-01-12: 325 mg via ORAL
  Filled 2019-01-11: qty 1

## 2019-01-11 MED ORDER — SODIUM CHLORIDE 0.9% FLUSH
3.0000 mL | Freq: Two times a day (BID) | INTRAVENOUS | Status: DC
  Administered 2019-01-11 (×2): 3 mL via INTRAVENOUS

## 2019-01-11 MED ORDER — TAMSULOSIN HCL 0.4 MG PO CAPS
0.4000 mg | ORAL_CAPSULE | Freq: Every day | ORAL | Status: DC
  Administered 2019-01-11: 0.4 mg via ORAL
  Filled 2019-01-11: qty 1

## 2019-01-11 MED ORDER — FENTANYL CITRATE (PF) 100 MCG/2ML IJ SOLN
INTRAMUSCULAR | Status: DC | PRN
  Administered 2019-01-11: 50 ug via INTRAVENOUS
  Administered 2019-01-11: 25 ug via INTRAVENOUS

## 2019-01-11 MED ORDER — FUROSEMIDE 20 MG PO TABS
20.0000 mg | ORAL_TABLET | Freq: Every day | ORAL | Status: DC
  Administered 2019-01-11: 20 mg via ORAL
  Filled 2019-01-11: qty 1

## 2019-01-11 MED ORDER — POLYETHYLENE GLYCOL 3350 17 G PO PACK
17.0000 g | PACK | Freq: Every day | ORAL | Status: DC
  Administered 2019-01-11: 17 g via ORAL
  Filled 2019-01-11: qty 1

## 2019-01-11 MED ORDER — MIDAZOLAM HCL 2 MG/2ML IJ SOLN
INTRAMUSCULAR | Status: AC
  Filled 2019-01-11: qty 2

## 2019-01-11 MED ORDER — HEPARIN SODIUM (PORCINE) 1000 UNIT/ML IJ SOLN
INTRAMUSCULAR | Status: DC | PRN
  Administered 2019-01-11: 5000 [IU] via INTRAVENOUS

## 2019-01-11 MED ORDER — INFLUENZA VAC A&B SA ADJ QUAD 0.5 ML IM PRSY
0.5000 mL | PREFILLED_SYRINGE | INTRAMUSCULAR | Status: DC
  Filled 2019-01-11: qty 0.5

## 2019-01-11 SURGICAL SUPPLY — 17 items
CANNULA 5F STIFF (CANNULA) ×1 IMPLANT
CATH INFINITI JR4 5F (CATHETERS) ×1 IMPLANT
DILATOR VESSEL 38 20CM 12FR (INTRODUCER) ×1 IMPLANT
DILATOR VESSEL 38 20CM 14FR (INTRODUCER) ×1 IMPLANT
DILATOR VESSEL 38 20CM 18FR (INTRODUCER) ×1 IMPLANT
DILATOR VESSEL 38 20CM 8FR (INTRODUCER) ×1 IMPLANT
GUIDEWIRE SUPER STIFF .035X180 (WIRE) ×1 IMPLANT
MICRA AV TRANSCATH PACING SYS (Pacemaker) ×2 IMPLANT
MICRA INTRODUCER SHEATH (SHEATH) ×2
NDL PERC 18GX7CM (NEEDLE) IMPLANT
NEEDLE PERC 18GX7CM (NEEDLE) ×2 IMPLANT
SHEATH AVANTI 7FRX11 (SHEATH) ×1 IMPLANT
SHEATH INTRODUCER MICRA (SHEATH) IMPLANT
SUT SILK 0 FSL (SUTURE) ×1 IMPLANT
SYSTEM PACING TRNSCTH AV MICRA (Pacemaker) IMPLANT
WIRE GUIDERIGHT .035X150 (WIRE) ×1 IMPLANT
WIRE HITORQ VERSACORE ST 145CM (WIRE) ×1 IMPLANT

## 2019-01-11 NOTE — Progress Notes (Signed)
Patient verbalized having constipation problems and requested that his miralax daily be ordered and that he has a stool softener. 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  Patient does void and is continent but does not completely empty his bladder. Order obtained for medication and in and out cath order.

## 2019-01-11 NOTE — Progress Notes (Signed)
PHARMACIST - PHYSICIAN ORDER COMMUNICATION  CONCERNING: P&T Medication Policy on Herbal Medications  DESCRIPTION:  This patient's order for:  Alendronate  has been noted.  This product(s) is classified as an "herbal" or natural product. Due to a lack of definitive safety studies or FDA approval, nonstandard manufacturing practices, plus the potential risk of unknown drug-drug interactions while on inpatient medications, the Pharmacy and Therapeutics Committee does not permit the use of "herbal" or natural products of this type within [facility].   ACTION TAKEN: The pharmacy department is unable to verify this order at this time. Please reevaluate patient's clinical condition at discharge and address if the herbal or natural product(s) should be resumed at that time.  Prudy Feeler, RPh 01/11/2019 10:11 AM

## 2019-01-11 NOTE — Progress Notes (Signed)
Pt complaining of tingling in left hand and fingers. No weakness or pain. Dr Saralyn Pilar updated no orders at this time. Pt to transfer to ICU now

## 2019-01-12 DIAGNOSIS — Z87891 Personal history of nicotine dependence: Secondary | ICD-10-CM | POA: Diagnosis not present

## 2019-01-12 DIAGNOSIS — Z9889 Other specified postprocedural states: Secondary | ICD-10-CM | POA: Diagnosis not present

## 2019-01-12 DIAGNOSIS — I482 Chronic atrial fibrillation, unspecified: Secondary | ICD-10-CM | POA: Diagnosis not present

## 2019-01-12 DIAGNOSIS — Z7982 Long term (current) use of aspirin: Secondary | ICD-10-CM | POA: Diagnosis not present

## 2019-01-12 DIAGNOSIS — I10 Essential (primary) hypertension: Secondary | ICD-10-CM | POA: Diagnosis not present

## 2019-01-12 DIAGNOSIS — Z7901 Long term (current) use of anticoagulants: Secondary | ICD-10-CM | POA: Diagnosis not present

## 2019-01-12 DIAGNOSIS — Z79899 Other long term (current) drug therapy: Secondary | ICD-10-CM | POA: Diagnosis not present

## 2019-01-12 DIAGNOSIS — I495 Sick sinus syndrome: Secondary | ICD-10-CM | POA: Diagnosis not present

## 2019-01-12 DIAGNOSIS — R600 Localized edema: Secondary | ICD-10-CM | POA: Diagnosis not present

## 2019-01-12 LAB — BASIC METABOLIC PANEL
Anion gap: 7 (ref 5–15)
BUN: 21 mg/dL (ref 8–23)
CO2: 24 mmol/L (ref 22–32)
Calcium: 8.7 mg/dL — ABNORMAL LOW (ref 8.9–10.3)
Chloride: 106 mmol/L (ref 98–111)
Creatinine, Ser: 0.68 mg/dL (ref 0.61–1.24)
GFR calc Af Amer: >60 mL/min (ref >60)
GFR calc non Af Amer: >60 mL/min (ref >60)
Glucose, Bld: 105 mg/dL — ABNORMAL HIGH (ref 70–99)
Potassium: 4.3 mmol/L (ref 3.5–5.1)
Sodium: 137 mmol/L (ref 135–145)

## 2019-01-12 NOTE — Progress Notes (Signed)
Patient discharged. Discharge teaching completed and patient able to verbalize discharge instructions. Patient refused vaccines and stated he would get them at his PCP/follow up appointments. Patient also stated he would make his own follow up appointments and take his morning medication at home. All belongings sent with patient.

## 2019-01-12 NOTE — Progress Notes (Deleted)
   Physician Discharge Summary  Patient ID: Douglas Calhoun MRN: [id number] DOB/AGE: 83-Aug-1921 83 y.o.  Admit date: 01/11/2019 Discharge date: 01/12/2019  Primary Discharge Diagnosis Sick sinus syndrome Secondary Discharge Diagnosis Sick sinus syndrome   Hospital Course: Douglas Calhoun is a 85 year old male with a past medical history significant for longstanding persistent atrial fibrillation, on Eliquis, and sick sinus syndrome who underwent elective leadless pacemaker insertion on 01/11/19 with Dr. Saralyn Pilar.  Perioperative course was uneventful with no evidence of complications.  Douglas Calhoun denies chest pain, palpitations, or worsening shortness of breath.  Denies significant pain or tenderness from right groin access site with no evidence of bleeding or drainage.    Discharge Exam: Blood pressure (!) 130/102, pulse 77, temperature 98.1 F (36.7 C), temperature source Oral, resp. rate 14, height 5\' 6"  (1.676 m), weight 91.4 kg, SpO2 95 %.    General appearance: alert, cooperative and appears stated age Head: Normocephalic, without obvious abnormality, atraumatic Resp: clear to auscultation bilaterally Cardio: regularly irregular rhythm Extremities: extremities normal, atraumatic, no cyanosis or edema Pulses: 2+ and symmetric Skin: Skin color, texture, turgor normal. No rashes or lesions  Labs:   Lab Results  Component Value Date   WBC 10.7 04/12/2018   HGB 12.5 (L) 04/12/2018   HCT 37.1 (L) 04/12/2018   MCV 100 (H) 04/12/2018   PLT 372 04/12/2018    Recent Labs  Lab 01/12/19 0600  NA 137  K 4.3  CL 106  CO2 24  BUN 21  CREATININE 0.68  CALCIUM 8.7*  GLUCOSE 105*    EKG: Sinus rhythm, frequent PVCs   FOLLOW UP PLANS AND APPOINTMENTS   Follow-up Information    Corey Skains, MD Follow up in 1 week(s).   Specialty: Cardiology Contact information: [facility] [place] [address] [phone]           BRING ALL  MEDICATIONS WITH YOU TO FOLLOW UP APPOINTMENTS  1.  Atrial fibrillation/Sick sinus syndrome s/p leadless pacemaker insertion   -No evidence of perioperative complications; follow up with Dr. Nehemiah Massed or Hilbert Odor within 7-10 days following discharge   -May consider adding metoprolol in an outpatient setting; continue Eliquis 5mg  twice daily   Time spent with patient to include physician time: 25 minutes Signed:  The history, physical exam findings, and plan of care were all discussed with Dr. Bartholome Bill, and all decision making was made in collaboration.   Avie Arenas PA-C 01/12/2019, 8:40 AM

## 2019-01-12 NOTE — Discharge Instructions (Signed)
Avoid heavy lifting, anything greater than 10 pounds, for 4-6 weeks.  Follow up with Dr. Nehemiah Massed or Hilbert Odor within 7-10 days following discharge.

## 2019-01-12 NOTE — Discharge Summary (Signed)
   Physician Discharge Summary  Patient ID: YASHAR HEMME MRN: [id number] DOB/AGE: January 14, 1920 85 y.o.  Admit date: 01/11/2019 Discharge date: 01/12/2019  Primary Discharge Diagnosis Sick sinus syndrome Secondary Discharge Diagnosis Sick sinus syndrome   Hospital Course: Mr. Virginia is a 85 year old male with a past medical history significant for longstanding persistent atrial fibrillation, on Eliquis, and sick sinus syndrome who underwent elective leadless pacemaker insertion on 01/11/19 with Dr. Saralyn Pilar.  Perioperative course was uneventful with no evidence of complications.  Mr. Wetzell denies chest pain, palpitations, or worsening shortness of breath.  Denies significant pain or tenderness from right groin access site with no evidence of bleeding or drainage.    Discharge Exam: Blood pressure (!) 130/102, pulse 77, temperature 98.1 F (36.7 C), temperature source Oral, resp. rate 14, height 5\' 6"  (1.676 m), weight 91.4 kg, SpO2 95 %.   General appearance: alert and cooperative Head: Normocephalic, without obvious abnormality, atraumatic Resp: clear to auscultation bilaterally Cardio: regularly irregular rhythm Extremities: extremities normal, atraumatic, no cyanosis or edema Pulses: 2+ and symmetric Skin: Skin color, texture, turgor normal. No rashes or lesions Labs:   Lab Results  Component Value Date   WBC 10.7 04/12/2018   HGB 12.5 (L) 04/12/2018   HCT 37.1 (L) 04/12/2018   MCV 100 (H) 04/12/2018   PLT 372 04/12/2018    Recent Labs  Lab 01/12/19 0600  NA 137  K 4.3  CL 106  CO2 24  BUN 21  CREATININE 0.68  CALCIUM 8.7*  GLUCOSE 105*      EKG: Sinus rhythm with frequent PVCs   FOLLOW UP PLANS AND APPOINTMENTS   Follow-up Information    Corey Skains, MD Follow up in 1 week(s).   Specialty: Cardiology Contact information: [facility] [place] [address] [phone]           BRING ALL MEDICATIONS WITH  YOU TO FOLLOW UP APPOINTMENTS  1.  Atrial fibrillation/sick sinus syndrome   -No evidence of perioperative complications; follow up with Dr. Nehemiah Massed or Hilbert Odor within 7-10 days of discharge  -May consider adding metoprolol in an outpatient setting; continue Eliquis 5mg  twice daily   Time spent with patient to include physician time: 55min  Signed:  The history, physical exam findings, and plan of care were all discussed with Dr. Bartholome Bill, and all decision making was made in collaboration.   Avie Arenas PA-C 01/12/2019, 9:41 AM

## 2019-01-19 DIAGNOSIS — R0602 Shortness of breath: Secondary | ICD-10-CM | POA: Diagnosis not present

## 2019-01-19 DIAGNOSIS — I482 Chronic atrial fibrillation, unspecified: Secondary | ICD-10-CM | POA: Diagnosis not present

## 2019-01-19 DIAGNOSIS — R600 Localized edema: Secondary | ICD-10-CM | POA: Diagnosis not present

## 2019-01-19 DIAGNOSIS — I495 Sick sinus syndrome: Secondary | ICD-10-CM | POA: Diagnosis not present

## 2019-01-19 DIAGNOSIS — E782 Mixed hyperlipidemia: Secondary | ICD-10-CM | POA: Diagnosis not present

## 2019-01-26 ENCOUNTER — Encounter: Payer: Self-pay | Admitting: Family Medicine

## 2019-01-26 ENCOUNTER — Other Ambulatory Visit: Payer: Self-pay

## 2019-01-26 ENCOUNTER — Ambulatory Visit (INDEPENDENT_AMBULATORY_CARE_PROVIDER_SITE_OTHER): Payer: Medicare Other | Admitting: Family Medicine

## 2019-01-26 VITALS — BP 120/66 | HR 84 | Temp 96.8°F | Resp 20 | Wt 208.0 lb

## 2019-01-26 DIAGNOSIS — M7022 Olecranon bursitis, left elbow: Secondary | ICD-10-CM | POA: Diagnosis not present

## 2019-01-26 MED ORDER — METHYLPREDNISOLONE ACETATE 40 MG/ML IJ SUSP: 40.0000 mg | Freq: Once | INTRAMUSCULAR | Status: DC

## 2019-01-26 NOTE — Progress Notes (Signed)
Patient: Douglas Calhoun Male    DOB: 1919-08-15   85 y.o.   MRN: [id number] Visit Date: 01/26/2019  Today's Provider: Lelon Huh, MD   Chief Complaint  Patient presents with  . Joint Swelling   Subjective:     HPI Elbow Swelling: Patient complains of swelling of the left elbow. Swelling started 1 month ago and has worsened. Patient denies any pain or redness.    No Known Allergies   Current Outpatient Medications:  .  alendronate (FOSAMAX) 70 MG tablet, Take 70 mg by mouth once a week. Take with a full glass of water on an empty stomach on Saturdays., Disp: , Rfl:  .  apixaban (ELIQUIS) 5 MG TABS tablet, Take 5 mg by mouth 2 (two) times daily., Disp: , Rfl:  .  atorvastatin (LIPITOR) 20 MG tablet, Take 10 mg by mouth at bedtime. , Disp: , Rfl:  .  Calcium Carbonate-Vitamin D (CALCIUM 500 + D PO), Take 1 tablet by mouth daily., Disp: , Rfl:  .  cholecalciferol (VITAMIN D3) 25 MCG (1000 UT) tablet, Take 1,000 Units by mouth daily., Disp: , Rfl:  .  ferrous sulfate 324 MG TBEC, Take 324 mg by mouth 3 (three) times daily., Disp: , Rfl:  .  furosemide (LASIX) 20 MG tablet, Take 20 mg by mouth 2 (two) times daily. , Disp: , Rfl:  .  isosorbide dinitrate (ISORDIL) 30 MG tablet, Take 1 tablet (30 mg total) by mouth 2 (two) times daily., Disp: 60 tablet, Rfl: 11 .  Melatonin 5 MG CAPS, Take 5 mg by mouth at bedtime., Disp: , Rfl:  .  Multiple Vitamins-Minerals (MULTIVITAMIN ADULT PO), Take 1 tablet by mouth daily., Disp: , Rfl:  .  Polyethylene Glycol 3350 (MIRALAX PO), Take 1 Dose by mouth daily. Heaping teaspoon, Disp: , Rfl:  .  potassium chloride (K-DUR) 10 MEQ tablet, TAKE 1 TABLET BY MOUTH DAILY (Patient taking differently: Take 10 mEq by mouth daily. ), Disp: 30 tablet, Rfl: 11 .  Probiotic CAPS, Take 1 capsule by mouth daily., Disp: , Rfl:  .  Psyllium (METAMUCIL PO), Take 1 Dose by mouth daily., Disp: , Rfl:  .  pyridOXINE (VITAMIN B-6) 100 MG tablet, Take 100 mg by  mouth daily., Disp: , Rfl:  .  tamsulosin (FLOMAX) 0.4 MG CAPS capsule, Take 0.4 mg by mouth 2 (two) times daily. , Disp: , Rfl:   Review of Systems  Constitutional: Negative for appetite change, chills and fever.  Respiratory: Negative for chest tightness, shortness of breath and wheezing.   Cardiovascular: Negative for chest pain and palpitations.  Gastrointestinal: Negative for abdominal pain, nausea and vomiting.  Musculoskeletal: Positive for joint swelling (left elbow). Negative for arthralgias.    Social History   Tobacco Use  . Smoking status: Never Smoker  . Smokeless tobacco: Never Used  Substance Use Topics  . Alcohol use: Yes    Alcohol/week: 0.0 standard drinks    Comment: drinks 2 beers a month      Objective:   BP 120/66 (BP Location: Right Arm, Patient Position: Sitting, Cuff Size: Normal)   Pulse 84   Temp (!) 96.8 F (36 C) (Temporal)   Resp 20   Wt 208 lb (94.3 kg)   SpO2 96% Comment: room air  BMI 33.57 kg/m  Vitals:   01/26/19 1552  BP: 120/66  Pulse: 84  Resp: 20  Temp: (!) 96.8 F (36 C)  TempSrc: Temporal  SpO2: 96%  Weight: 208 lb (94.3 kg)  Body mass index is 33.57 kg/m.   Physical Exam   Golf ball sized fluid pocket over left olecranon. Not tender or inflamed.      Assessment & Plan    1. Olecranon bursitis of left elbow No sign of infection. Discussed options of observation versus aspirations. He prefers to aspirate Prepped with  isopropyl alcohol, inserted 25G sterile needle and aspirated 10cc serosanguinous fluid. injected- methylPREDNISolone acetate (DEPO-MEDROL) injection 40 mg into bursae   Applied ACE wrap and encouraged to keep wrapped as much as possible to reduce chance of recurrence. Advised that relatively high percentage of cases recur and we could aspirated again if it becomes bothersome.   The entirety of the information documented in the History of Present Illness, Review of Systems and Physical Exam were personally  obtained by me. Portions of this information were initially documented by Meyer Cory, CMA and reviewed by me for thoroughness and accuracy.      Lelon Huh, MD  [facility]

## 2019-01-26 NOTE — Patient Instructions (Addendum)
.   Please review the attached list of medications and notify my office if there are any errors.   . Please bring all of your medications to every appointment so we can make sure that our medication list is the same as yours.   

## 2019-02-01 DIAGNOSIS — R0602 Shortness of breath: Secondary | ICD-10-CM | POA: Diagnosis not present

## 2019-02-05 DIAGNOSIS — I495 Sick sinus syndrome: Secondary | ICD-10-CM | POA: Diagnosis not present

## 2019-02-05 DIAGNOSIS — E782 Mixed hyperlipidemia: Secondary | ICD-10-CM | POA: Diagnosis not present

## 2019-02-05 DIAGNOSIS — R0602 Shortness of breath: Secondary | ICD-10-CM | POA: Diagnosis not present

## 2019-02-05 DIAGNOSIS — I482 Chronic atrial fibrillation, unspecified: Secondary | ICD-10-CM | POA: Diagnosis not present

## 2019-02-27 DIAGNOSIS — M545 Low back pain: Secondary | ICD-10-CM | POA: Diagnosis not present

## 2019-02-27 DIAGNOSIS — M5136 Other intervertebral disc degeneration, lumbar region: Secondary | ICD-10-CM | POA: Diagnosis not present

## 2019-02-28 DIAGNOSIS — Z7901 Long term (current) use of anticoagulants: Secondary | ICD-10-CM | POA: Diagnosis not present

## 2019-02-28 DIAGNOSIS — R600 Localized edema: Secondary | ICD-10-CM | POA: Diagnosis not present

## 2019-02-28 DIAGNOSIS — R0689 Other abnormalities of breathing: Secondary | ICD-10-CM | POA: Diagnosis not present

## 2019-02-28 DIAGNOSIS — Z79899 Other long term (current) drug therapy: Secondary | ICD-10-CM | POA: Diagnosis not present

## 2019-02-28 DIAGNOSIS — I495 Sick sinus syndrome: Secondary | ICD-10-CM | POA: Diagnosis not present

## 2019-02-28 DIAGNOSIS — R410 Disorientation, unspecified: Secondary | ICD-10-CM | POA: Diagnosis not present

## 2019-02-28 DIAGNOSIS — E871 Hypo-osmolality and hyponatremia: Secondary | ICD-10-CM | POA: Diagnosis not present

## 2019-02-28 DIAGNOSIS — S199XXA Unspecified injury of neck, initial encounter: Secondary | ICD-10-CM | POA: Diagnosis not present

## 2019-02-28 DIAGNOSIS — Z66 Do not resuscitate: Secondary | ICD-10-CM | POA: Diagnosis not present

## 2019-02-28 DIAGNOSIS — R296 Repeated falls: Secondary | ICD-10-CM | POA: Diagnosis not present

## 2019-02-28 DIAGNOSIS — I493 Ventricular premature depolarization: Secondary | ICD-10-CM | POA: Diagnosis not present

## 2019-02-28 DIAGNOSIS — M48061 Spinal stenosis, lumbar region without neurogenic claudication: Secondary | ICD-10-CM | POA: Diagnosis not present

## 2019-02-28 DIAGNOSIS — W19XXXA Unspecified fall, initial encounter: Secondary | ICD-10-CM | POA: Diagnosis not present

## 2019-02-28 DIAGNOSIS — I517 Cardiomegaly: Secondary | ICD-10-CM | POA: Diagnosis not present

## 2019-02-28 DIAGNOSIS — B9689 Other specified bacterial agents as the cause of diseases classified elsewhere: Secondary | ICD-10-CM | POA: Diagnosis not present

## 2019-02-28 DIAGNOSIS — I509 Heart failure, unspecified: Secondary | ICD-10-CM | POA: Diagnosis not present

## 2019-02-28 DIAGNOSIS — M549 Dorsalgia, unspecified: Secondary | ICD-10-CM | POA: Diagnosis not present

## 2019-02-28 DIAGNOSIS — N390 Urinary tract infection, site not specified: Secondary | ICD-10-CM | POA: Diagnosis not present

## 2019-02-28 DIAGNOSIS — M545 Low back pain: Secondary | ICD-10-CM | POA: Diagnosis not present

## 2019-02-28 DIAGNOSIS — I110 Hypertensive heart disease with heart failure: Secondary | ICD-10-CM | POA: Diagnosis not present

## 2019-02-28 DIAGNOSIS — I209 Angina pectoris, unspecified: Secondary | ICD-10-CM | POA: Diagnosis not present

## 2019-02-28 DIAGNOSIS — Z743 Need for continuous supervision: Secondary | ICD-10-CM | POA: Diagnosis not present

## 2019-02-28 DIAGNOSIS — S299XXA Unspecified injury of thorax, initial encounter: Secondary | ICD-10-CM | POA: Diagnosis not present

## 2019-02-28 DIAGNOSIS — I4891 Unspecified atrial fibrillation: Secondary | ICD-10-CM | POA: Diagnosis not present

## 2019-02-28 DIAGNOSIS — I4892 Unspecified atrial flutter: Secondary | ICD-10-CM | POA: Diagnosis not present

## 2019-02-28 DIAGNOSIS — H1131 Conjunctival hemorrhage, right eye: Secondary | ICD-10-CM | POA: Diagnosis not present

## 2019-02-28 DIAGNOSIS — K5901 Slow transit constipation: Secondary | ICD-10-CM | POA: Diagnosis not present

## 2019-02-28 DIAGNOSIS — S22000A Wedge compression fracture of unspecified thoracic vertebra, initial encounter for closed fracture: Secondary | ICD-10-CM | POA: Diagnosis not present

## 2019-02-28 DIAGNOSIS — R338 Other retention of urine: Secondary | ICD-10-CM | POA: Diagnosis not present

## 2019-02-28 DIAGNOSIS — R197 Diarrhea, unspecified: Secondary | ICD-10-CM | POA: Diagnosis not present

## 2019-02-28 DIAGNOSIS — I6789 Other cerebrovascular disease: Secondary | ICD-10-CM | POA: Diagnosis not present

## 2019-02-28 DIAGNOSIS — M1990 Unspecified osteoarthritis, unspecified site: Secondary | ICD-10-CM | POA: Diagnosis not present

## 2019-02-28 DIAGNOSIS — E785 Hyperlipidemia, unspecified: Secondary | ICD-10-CM | POA: Diagnosis not present

## 2019-02-28 DIAGNOSIS — K5900 Constipation, unspecified: Secondary | ICD-10-CM | POA: Diagnosis not present

## 2019-02-28 DIAGNOSIS — S32031A Stable burst fracture of third lumbar vertebra, initial encounter for closed fracture: Secondary | ICD-10-CM | POA: Diagnosis not present

## 2019-02-28 DIAGNOSIS — I5023 Acute on chronic systolic (congestive) heart failure: Secondary | ICD-10-CM | POA: Diagnosis not present

## 2019-02-28 DIAGNOSIS — I482 Chronic atrial fibrillation, unspecified: Secondary | ICD-10-CM | POA: Diagnosis not present

## 2019-02-28 DIAGNOSIS — M8008XA Age-related osteoporosis with current pathological fracture, vertebra(e), initial encounter for fracture: Secondary | ICD-10-CM | POA: Diagnosis not present

## 2019-02-28 DIAGNOSIS — S0990XA Unspecified injury of head, initial encounter: Secondary | ICD-10-CM | POA: Diagnosis not present

## 2019-02-28 DIAGNOSIS — R918 Other nonspecific abnormal finding of lung field: Secondary | ICD-10-CM | POA: Diagnosis not present

## 2019-02-28 DIAGNOSIS — M5489 Other dorsalgia: Secondary | ICD-10-CM | POA: Diagnosis not present

## 2019-03-02 DIAGNOSIS — E871 Hypo-osmolality and hyponatremia: Secondary | ICD-10-CM | POA: Insufficient documentation

## 2019-03-06 ENCOUNTER — Telehealth: Payer: Self-pay | Admitting: Family Medicine

## 2019-03-06 NOTE — Telephone Encounter (Signed)
Douglas Calhoun with [facility] called back about hospital f/u. Pt is tentatively scheduled to be discharged from [facility] 03/07/2019 and is scheduled for hospital f/u on 03/12/2019 for 40 min appt. Thanks TNP

## 2019-03-06 NOTE — Telephone Encounter (Signed)
Douglas Calhoun from [facility] - pt being discharged and needing a f/u visit next week w/ [facility]. Please call her back with a time pt can be worked in for a visit at [phone].  Thanks, [employer]

## 2019-03-12 ENCOUNTER — Inpatient Hospital Stay: Payer: Medicare Other | Admitting: Family Medicine

## 2019-03-12 MED ORDER — LIDOCAINE 5 % EX PTCH
1.00 | MEDICATED_PATCH | CUTANEOUS | Status: DC
Start: 2019-03-12 — End: 2019-03-12

## 2019-03-12 MED ORDER — LIDOCAINE HCL 1 % IJ SOLN
0.50 | INTRAMUSCULAR | Status: DC
Start: ? — End: 2019-03-12

## 2019-03-12 MED ORDER — APIXABAN 5 MG PO TABS
5.00 | ORAL_TABLET | ORAL | Status: DC
Start: 2019-03-12 — End: 2019-03-12

## 2019-03-12 MED ORDER — EQL PEDIATRIC ELECTROLYTE PO SOLN
0.40 | ORAL | Status: DC
Start: 2019-03-13 — End: 2019-03-12

## 2019-03-12 MED ORDER — FUROSEMIDE 40 MG PO TABS
40.00 | ORAL_TABLET | ORAL | Status: DC
Start: 2019-03-13 — End: 2019-03-12

## 2019-03-12 MED ORDER — ALENDRONATE SODIUM 70 MG PO TABS
70.00 | ORAL_TABLET | ORAL | Status: DC
Start: 2019-03-13 — End: 2019-03-12

## 2019-03-12 MED ORDER — ISOSORBIDE DINITRATE 20 MG PO TABS
20.00 | ORAL_TABLET | ORAL | Status: DC
Start: 2019-03-12 — End: 2019-03-12

## 2019-03-12 MED ORDER — SENNOSIDES-DOCUSATE SODIUM 8.6-50 MG PO TABS
2.00 | ORAL_TABLET | ORAL | Status: DC
Start: 2019-03-12 — End: 2019-03-12

## 2019-03-12 MED ORDER — PSYLLIUM 95 % PO PACK
1.00 | PACK | ORAL | Status: DC
Start: 2019-03-13 — End: 2019-03-12

## 2019-03-12 MED ORDER — RA GLUCOSAMINE-CHONDROITIN DS 500-400 MG PO TABS
100.00 | ORAL_TABLET | ORAL | Status: DC
Start: 2019-03-12 — End: 2019-03-12

## 2019-03-12 MED ORDER — POLYETHYLENE GLYCOL 3350 17 G PO PACK
17.00 | PACK | ORAL | Status: DC
Start: 2019-03-12 — End: 2019-03-12

## 2019-03-12 MED ORDER — CALCIUM CARBONATE ANTACID 750 MG PO CHEW
CHEWABLE_TABLET | ORAL | Status: DC
Start: 2019-03-13 — End: 2019-03-12

## 2019-03-12 MED ORDER — ACETAMINOPHEN 325 MG PO TABS
975.00 | ORAL_TABLET | ORAL | Status: DC
Start: 2019-03-12 — End: 2019-03-12

## 2019-03-12 MED ORDER — CHOLECALCIFEROL 25 MCG (1000 UT) PO TABS
2000.00 | ORAL_TABLET | ORAL | Status: DC
Start: 2019-03-13 — End: 2019-03-12

## 2019-03-12 MED ORDER — MELATONIN 3 MG PO TABS
3.00 | ORAL_TABLET | ORAL | Status: DC
Start: 2019-03-12 — End: 2019-03-12

## 2019-03-12 MED ORDER — OXYCODONE HCL 5 MG PO TABS
2.50 | ORAL_TABLET | ORAL | Status: DC
Start: ? — End: 2019-03-12

## 2019-03-15 DIAGNOSIS — I209 Angina pectoris, unspecified: Secondary | ICD-10-CM | POA: Diagnosis not present

## 2019-03-15 DIAGNOSIS — I5023 Acute on chronic systolic (congestive) heart failure: Secondary | ICD-10-CM | POA: Diagnosis not present

## 2019-03-15 DIAGNOSIS — I495 Sick sinus syndrome: Secondary | ICD-10-CM | POA: Diagnosis not present

## 2019-03-15 DIAGNOSIS — R300 Dysuria: Secondary | ICD-10-CM | POA: Diagnosis not present

## 2019-03-15 DIAGNOSIS — I482 Chronic atrial fibrillation, unspecified: Secondary | ICD-10-CM | POA: Diagnosis not present

## 2019-03-15 DIAGNOSIS — M1990 Unspecified osteoarthritis, unspecified site: Secondary | ICD-10-CM | POA: Diagnosis not present

## 2019-03-15 DIAGNOSIS — M8008XD Age-related osteoporosis with current pathological fracture, vertebra(e), subsequent encounter for fracture with routine healing: Secondary | ICD-10-CM | POA: Diagnosis not present

## 2019-03-15 DIAGNOSIS — Z960 Presence of urogenital implants: Secondary | ICD-10-CM | POA: Diagnosis not present

## 2019-03-15 DIAGNOSIS — I110 Hypertensive heart disease with heart failure: Secondary | ICD-10-CM | POA: Diagnosis not present

## 2019-03-15 DIAGNOSIS — Z7901 Long term (current) use of anticoagulants: Secondary | ICD-10-CM | POA: Diagnosis not present

## 2019-03-15 DIAGNOSIS — Z9181 History of falling: Secondary | ICD-10-CM | POA: Diagnosis not present

## 2019-03-15 DIAGNOSIS — Z87891 Personal history of nicotine dependence: Secondary | ICD-10-CM | POA: Diagnosis not present

## 2019-03-15 DIAGNOSIS — M48061 Spinal stenosis, lumbar region without neurogenic claudication: Secondary | ICD-10-CM | POA: Diagnosis not present

## 2019-03-15 DIAGNOSIS — Z87440 Personal history of urinary (tract) infections: Secondary | ICD-10-CM | POA: Diagnosis not present

## 2019-03-15 DIAGNOSIS — E782 Mixed hyperlipidemia: Secondary | ICD-10-CM | POA: Diagnosis not present

## 2019-03-15 DIAGNOSIS — Z950 Presence of cardiac pacemaker: Secondary | ICD-10-CM | POA: Diagnosis not present

## 2019-03-16 ENCOUNTER — Telehealth: Payer: Self-pay | Admitting: *Deleted

## 2019-03-16 ENCOUNTER — Telehealth: Payer: Self-pay | Admitting: Family Medicine

## 2019-03-16 DIAGNOSIS — I5023 Acute on chronic systolic (congestive) heart failure: Secondary | ICD-10-CM | POA: Diagnosis not present

## 2019-03-16 DIAGNOSIS — R300 Dysuria: Secondary | ICD-10-CM | POA: Diagnosis not present

## 2019-03-16 DIAGNOSIS — M8008XD Age-related osteoporosis with current pathological fracture, vertebra(e), subsequent encounter for fracture with routine healing: Secondary | ICD-10-CM | POA: Diagnosis not present

## 2019-03-16 DIAGNOSIS — Z7901 Long term (current) use of anticoagulants: Secondary | ICD-10-CM | POA: Diagnosis not present

## 2019-03-16 DIAGNOSIS — I495 Sick sinus syndrome: Secondary | ICD-10-CM

## 2019-03-16 DIAGNOSIS — Z960 Presence of urogenital implants: Secondary | ICD-10-CM | POA: Diagnosis not present

## 2019-03-16 DIAGNOSIS — I482 Chronic atrial fibrillation, unspecified: Secondary | ICD-10-CM | POA: Diagnosis not present

## 2019-03-16 DIAGNOSIS — I110 Hypertensive heart disease with heart failure: Secondary | ICD-10-CM | POA: Diagnosis not present

## 2019-03-16 DIAGNOSIS — Z87891 Personal history of nicotine dependence: Secondary | ICD-10-CM | POA: Diagnosis not present

## 2019-03-16 DIAGNOSIS — Z87440 Personal history of urinary (tract) infections: Secondary | ICD-10-CM | POA: Diagnosis not present

## 2019-03-16 DIAGNOSIS — M48061 Spinal stenosis, lumbar region without neurogenic claudication: Secondary | ICD-10-CM | POA: Diagnosis not present

## 2019-03-16 DIAGNOSIS — Z950 Presence of cardiac pacemaker: Secondary | ICD-10-CM | POA: Diagnosis not present

## 2019-03-16 DIAGNOSIS — Z9181 History of falling: Secondary | ICD-10-CM | POA: Diagnosis not present

## 2019-03-16 DIAGNOSIS — E782 Mixed hyperlipidemia: Secondary | ICD-10-CM | POA: Diagnosis not present

## 2019-03-16 DIAGNOSIS — I209 Angina pectoris, unspecified: Secondary | ICD-10-CM | POA: Diagnosis not present

## 2019-03-16 DIAGNOSIS — I480 Paroxysmal atrial fibrillation: Secondary | ICD-10-CM

## 2019-03-16 DIAGNOSIS — I5022 Chronic systolic (congestive) heart failure: Secondary | ICD-10-CM

## 2019-03-16 DIAGNOSIS — M1990 Unspecified osteoarthritis, unspecified site: Secondary | ICD-10-CM | POA: Diagnosis not present

## 2019-03-16 NOTE — Telephone Encounter (Signed)
I would recommend seeing Dr. Rockey Situ or Dr. Saunders Revel with [facility]. They have offices at [facility]  [facility] does not usually allow patients to changes to another cardiologist with their clinic.

## 2019-03-16 NOTE — Telephone Encounter (Signed)
Referral placed.

## 2019-03-16 NOTE — Telephone Encounter (Signed)
That's fine

## 2019-03-16 NOTE — Telephone Encounter (Signed)
Patient advised. Patient would like to see Dr. Rockey Situ.

## 2019-03-16 NOTE — Telephone Encounter (Signed)
Patient is currently seeing Dr. Gigi Gin. Patient states "he does not think he is the best match for him" He is requesting Dr. Maralyn Sago recommendation to switch to Dr. Ubaldo Glassing. Please advise.

## 2019-03-16 NOTE — Telephone Encounter (Signed)
Home Health Verbal Orders - Caller/Agency: Tiffany with [facility] Number: [phone], [place] to leave a message Requesting OT/PT/Skilled Nursing/Social Work/Speech Therapy: PT Frequency: 1x a week for 1 week, 2x a week for 5 weeks, 1x a week for 2 weeks

## 2019-03-16 NOTE — Telephone Encounter (Signed)
Tiffany with [facility] advised.

## 2019-03-16 NOTE — Telephone Encounter (Signed)
Source Subject Topic  Douglas Calhoun (Patient) Douglas Calhoun (Patient) General - Other  Patient calling requesting to speak with Dr. Caryn Section. He states he would like to discuss his recent hospital stay before hospital follow up appointment.

## 2019-03-19 DIAGNOSIS — I495 Sick sinus syndrome: Secondary | ICD-10-CM | POA: Diagnosis not present

## 2019-03-19 DIAGNOSIS — Z9181 History of falling: Secondary | ICD-10-CM | POA: Diagnosis not present

## 2019-03-19 DIAGNOSIS — I5023 Acute on chronic systolic (congestive) heart failure: Secondary | ICD-10-CM | POA: Diagnosis not present

## 2019-03-19 DIAGNOSIS — I110 Hypertensive heart disease with heart failure: Secondary | ICD-10-CM | POA: Diagnosis not present

## 2019-03-19 DIAGNOSIS — Z7901 Long term (current) use of anticoagulants: Secondary | ICD-10-CM | POA: Diagnosis not present

## 2019-03-19 DIAGNOSIS — M48061 Spinal stenosis, lumbar region without neurogenic claudication: Secondary | ICD-10-CM | POA: Diagnosis not present

## 2019-03-19 DIAGNOSIS — I482 Chronic atrial fibrillation, unspecified: Secondary | ICD-10-CM | POA: Diagnosis not present

## 2019-03-19 DIAGNOSIS — E782 Mixed hyperlipidemia: Secondary | ICD-10-CM | POA: Diagnosis not present

## 2019-03-19 DIAGNOSIS — Z950 Presence of cardiac pacemaker: Secondary | ICD-10-CM | POA: Diagnosis not present

## 2019-03-19 DIAGNOSIS — I209 Angina pectoris, unspecified: Secondary | ICD-10-CM | POA: Diagnosis not present

## 2019-03-19 DIAGNOSIS — Z87891 Personal history of nicotine dependence: Secondary | ICD-10-CM | POA: Diagnosis not present

## 2019-03-19 DIAGNOSIS — Z87440 Personal history of urinary (tract) infections: Secondary | ICD-10-CM | POA: Diagnosis not present

## 2019-03-19 DIAGNOSIS — M8008XD Age-related osteoporosis with current pathological fracture, vertebra(e), subsequent encounter for fracture with routine healing: Secondary | ICD-10-CM | POA: Diagnosis not present

## 2019-03-19 DIAGNOSIS — M1990 Unspecified osteoarthritis, unspecified site: Secondary | ICD-10-CM | POA: Diagnosis not present

## 2019-03-19 DIAGNOSIS — R300 Dysuria: Secondary | ICD-10-CM | POA: Diagnosis not present

## 2019-03-19 DIAGNOSIS — Z960 Presence of urogenital implants: Secondary | ICD-10-CM | POA: Diagnosis not present

## 2019-03-19 NOTE — Telephone Encounter (Signed)
Caller name: Lattie Haw Relation to pt: Nurse with [facility]  Call back number:  [phone]    Reason for call:  Requesting orders for social worker and long planning   [phone]

## 2019-03-19 NOTE — Telephone Encounter (Signed)
From [facility] 

## 2019-03-19 NOTE — Telephone Encounter (Signed)
Please review. Tried [employer]. Need clarification as to whether she is requesting a verbal order for social worker and long term planning or something else.

## 2019-03-20 DIAGNOSIS — Z950 Presence of cardiac pacemaker: Secondary | ICD-10-CM | POA: Diagnosis not present

## 2019-03-20 DIAGNOSIS — Z87440 Personal history of urinary (tract) infections: Secondary | ICD-10-CM | POA: Diagnosis not present

## 2019-03-20 DIAGNOSIS — M8008XD Age-related osteoporosis with current pathological fracture, vertebra(e), subsequent encounter for fracture with routine healing: Secondary | ICD-10-CM | POA: Diagnosis not present

## 2019-03-20 DIAGNOSIS — R300 Dysuria: Secondary | ICD-10-CM | POA: Diagnosis not present

## 2019-03-20 DIAGNOSIS — Z9181 History of falling: Secondary | ICD-10-CM | POA: Diagnosis not present

## 2019-03-20 DIAGNOSIS — E782 Mixed hyperlipidemia: Secondary | ICD-10-CM | POA: Diagnosis not present

## 2019-03-20 DIAGNOSIS — I110 Hypertensive heart disease with heart failure: Secondary | ICD-10-CM | POA: Diagnosis not present

## 2019-03-20 DIAGNOSIS — Z7901 Long term (current) use of anticoagulants: Secondary | ICD-10-CM | POA: Diagnosis not present

## 2019-03-20 DIAGNOSIS — I209 Angina pectoris, unspecified: Secondary | ICD-10-CM | POA: Diagnosis not present

## 2019-03-20 DIAGNOSIS — I495 Sick sinus syndrome: Secondary | ICD-10-CM | POA: Diagnosis not present

## 2019-03-20 DIAGNOSIS — I5023 Acute on chronic systolic (congestive) heart failure: Secondary | ICD-10-CM | POA: Diagnosis not present

## 2019-03-20 DIAGNOSIS — Z960 Presence of urogenital implants: Secondary | ICD-10-CM | POA: Diagnosis not present

## 2019-03-20 DIAGNOSIS — I482 Chronic atrial fibrillation, unspecified: Secondary | ICD-10-CM | POA: Diagnosis not present

## 2019-03-20 DIAGNOSIS — M1990 Unspecified osteoarthritis, unspecified site: Secondary | ICD-10-CM | POA: Diagnosis not present

## 2019-03-20 DIAGNOSIS — Z87891 Personal history of nicotine dependence: Secondary | ICD-10-CM | POA: Diagnosis not present

## 2019-03-20 DIAGNOSIS — M48061 Spinal stenosis, lumbar region without neurogenic claudication: Secondary | ICD-10-CM | POA: Diagnosis not present

## 2019-03-23 DIAGNOSIS — Z87440 Personal history of urinary (tract) infections: Secondary | ICD-10-CM | POA: Diagnosis not present

## 2019-03-23 DIAGNOSIS — E782 Mixed hyperlipidemia: Secondary | ICD-10-CM | POA: Diagnosis not present

## 2019-03-23 DIAGNOSIS — Z9181 History of falling: Secondary | ICD-10-CM | POA: Diagnosis not present

## 2019-03-23 DIAGNOSIS — R300 Dysuria: Secondary | ICD-10-CM | POA: Diagnosis not present

## 2019-03-23 DIAGNOSIS — I209 Angina pectoris, unspecified: Secondary | ICD-10-CM | POA: Diagnosis not present

## 2019-03-23 DIAGNOSIS — Z960 Presence of urogenital implants: Secondary | ICD-10-CM | POA: Diagnosis not present

## 2019-03-23 DIAGNOSIS — M8008XD Age-related osteoporosis with current pathological fracture, vertebra(e), subsequent encounter for fracture with routine healing: Secondary | ICD-10-CM | POA: Diagnosis not present

## 2019-03-23 DIAGNOSIS — I110 Hypertensive heart disease with heart failure: Secondary | ICD-10-CM | POA: Diagnosis not present

## 2019-03-23 DIAGNOSIS — Z87891 Personal history of nicotine dependence: Secondary | ICD-10-CM | POA: Diagnosis not present

## 2019-03-23 DIAGNOSIS — M48061 Spinal stenosis, lumbar region without neurogenic claudication: Secondary | ICD-10-CM | POA: Diagnosis not present

## 2019-03-23 DIAGNOSIS — I495 Sick sinus syndrome: Secondary | ICD-10-CM | POA: Diagnosis not present

## 2019-03-23 DIAGNOSIS — Z7901 Long term (current) use of anticoagulants: Secondary | ICD-10-CM | POA: Diagnosis not present

## 2019-03-23 DIAGNOSIS — M1990 Unspecified osteoarthritis, unspecified site: Secondary | ICD-10-CM | POA: Diagnosis not present

## 2019-03-23 DIAGNOSIS — Z950 Presence of cardiac pacemaker: Secondary | ICD-10-CM | POA: Diagnosis not present

## 2019-03-23 DIAGNOSIS — I5023 Acute on chronic systolic (congestive) heart failure: Secondary | ICD-10-CM | POA: Diagnosis not present

## 2019-03-23 DIAGNOSIS — I482 Chronic atrial fibrillation, unspecified: Secondary | ICD-10-CM | POA: Diagnosis not present

## 2019-03-26 DIAGNOSIS — Z7901 Long term (current) use of anticoagulants: Secondary | ICD-10-CM | POA: Diagnosis not present

## 2019-03-26 DIAGNOSIS — I5023 Acute on chronic systolic (congestive) heart failure: Secondary | ICD-10-CM | POA: Diagnosis not present

## 2019-03-26 DIAGNOSIS — M8008XD Age-related osteoporosis with current pathological fracture, vertebra(e), subsequent encounter for fracture with routine healing: Secondary | ICD-10-CM | POA: Diagnosis not present

## 2019-03-26 DIAGNOSIS — I110 Hypertensive heart disease with heart failure: Secondary | ICD-10-CM | POA: Diagnosis not present

## 2019-03-26 DIAGNOSIS — Z87440 Personal history of urinary (tract) infections: Secondary | ICD-10-CM | POA: Diagnosis not present

## 2019-03-26 DIAGNOSIS — R300 Dysuria: Secondary | ICD-10-CM | POA: Diagnosis not present

## 2019-03-26 DIAGNOSIS — I482 Chronic atrial fibrillation, unspecified: Secondary | ICD-10-CM | POA: Diagnosis not present

## 2019-03-26 DIAGNOSIS — Z87891 Personal history of nicotine dependence: Secondary | ICD-10-CM | POA: Diagnosis not present

## 2019-03-26 DIAGNOSIS — E782 Mixed hyperlipidemia: Secondary | ICD-10-CM | POA: Diagnosis not present

## 2019-03-26 DIAGNOSIS — M1990 Unspecified osteoarthritis, unspecified site: Secondary | ICD-10-CM | POA: Diagnosis not present

## 2019-03-26 DIAGNOSIS — M48061 Spinal stenosis, lumbar region without neurogenic claudication: Secondary | ICD-10-CM | POA: Diagnosis not present

## 2019-03-26 DIAGNOSIS — I209 Angina pectoris, unspecified: Secondary | ICD-10-CM | POA: Diagnosis not present

## 2019-03-26 DIAGNOSIS — I495 Sick sinus syndrome: Secondary | ICD-10-CM | POA: Diagnosis not present

## 2019-03-26 DIAGNOSIS — Z950 Presence of cardiac pacemaker: Secondary | ICD-10-CM | POA: Diagnosis not present

## 2019-03-26 DIAGNOSIS — Z960 Presence of urogenital implants: Secondary | ICD-10-CM | POA: Diagnosis not present

## 2019-03-26 DIAGNOSIS — Z9181 History of falling: Secondary | ICD-10-CM | POA: Diagnosis not present

## 2019-03-27 ENCOUNTER — Encounter: Payer: Self-pay | Admitting: Family Medicine

## 2019-03-27 ENCOUNTER — Other Ambulatory Visit: Payer: Self-pay

## 2019-03-27 ENCOUNTER — Ambulatory Visit (INDEPENDENT_AMBULATORY_CARE_PROVIDER_SITE_OTHER): Payer: Medicare Other | Admitting: Family Medicine

## 2019-03-27 ENCOUNTER — Encounter

## 2019-03-27 VITALS — BP 102/62 | HR 72 | Temp 96.8°F | Resp 24 | Wt 198.0 lb

## 2019-03-27 DIAGNOSIS — Z7901 Long term (current) use of anticoagulants: Secondary | ICD-10-CM | POA: Diagnosis not present

## 2019-03-27 DIAGNOSIS — M4856XD Collapsed vertebra, not elsewhere classified, lumbar region, subsequent encounter for fracture with routine healing: Secondary | ICD-10-CM

## 2019-03-27 DIAGNOSIS — I509 Heart failure, unspecified: Secondary | ICD-10-CM

## 2019-03-27 DIAGNOSIS — E782 Mixed hyperlipidemia: Secondary | ICD-10-CM | POA: Diagnosis not present

## 2019-03-27 DIAGNOSIS — Z9181 History of falling: Secondary | ICD-10-CM | POA: Diagnosis not present

## 2019-03-27 DIAGNOSIS — M4856XA Collapsed vertebra, not elsewhere classified, lumbar region, initial encounter for fracture: Secondary | ICD-10-CM | POA: Insufficient documentation

## 2019-03-27 DIAGNOSIS — I110 Hypertensive heart disease with heart failure: Secondary | ICD-10-CM | POA: Diagnosis not present

## 2019-03-27 DIAGNOSIS — M48061 Spinal stenosis, lumbar region without neurogenic claudication: Secondary | ICD-10-CM | POA: Diagnosis not present

## 2019-03-27 DIAGNOSIS — I209 Angina pectoris, unspecified: Secondary | ICD-10-CM | POA: Diagnosis not present

## 2019-03-27 DIAGNOSIS — M8008XD Age-related osteoporosis with current pathological fracture, vertebra(e), subsequent encounter for fracture with routine healing: Secondary | ICD-10-CM | POA: Diagnosis not present

## 2019-03-27 DIAGNOSIS — I495 Sick sinus syndrome: Secondary | ICD-10-CM | POA: Diagnosis not present

## 2019-03-27 DIAGNOSIS — M1990 Unspecified osteoarthritis, unspecified site: Secondary | ICD-10-CM | POA: Diagnosis not present

## 2019-03-27 DIAGNOSIS — R300 Dysuria: Secondary | ICD-10-CM | POA: Diagnosis not present

## 2019-03-27 DIAGNOSIS — D509 Iron deficiency anemia, unspecified: Secondary | ICD-10-CM

## 2019-03-27 DIAGNOSIS — I482 Chronic atrial fibrillation, unspecified: Secondary | ICD-10-CM | POA: Diagnosis not present

## 2019-03-27 DIAGNOSIS — Z950 Presence of cardiac pacemaker: Secondary | ICD-10-CM | POA: Diagnosis not present

## 2019-03-27 DIAGNOSIS — M79672 Pain in left foot: Secondary | ICD-10-CM

## 2019-03-27 DIAGNOSIS — Z960 Presence of urogenital implants: Secondary | ICD-10-CM | POA: Diagnosis not present

## 2019-03-27 DIAGNOSIS — I5023 Acute on chronic systolic (congestive) heart failure: Secondary | ICD-10-CM | POA: Diagnosis not present

## 2019-03-27 DIAGNOSIS — Z87440 Personal history of urinary (tract) infections: Secondary | ICD-10-CM | POA: Diagnosis not present

## 2019-03-27 DIAGNOSIS — Z87891 Personal history of nicotine dependence: Secondary | ICD-10-CM | POA: Diagnosis not present

## 2019-03-27 MED ORDER — GABAPENTIN 100 MG PO CAPS: ORAL_CAPSULE | ORAL | Status: DC

## 2019-03-27 NOTE — Patient Instructions (Addendum)
.   Take an extra 40mg  furosemide tablet today and tomorrow  . Please go to the lab draw station in [address] on the second floor of [facility] . Normal hours are 8:00am to 12:30pm and 1:30pm to 4:00pm Monday through Friday   Please call Dr. Donivan Scull office (cardiology) to confirm your follow up appointment   Increase nighttime dose of gabapentin to 2 capsules. Continue 1 capsule in the morning

## 2019-03-27 NOTE — Progress Notes (Signed)
Patient: Douglas Calhoun Male    DOB: 1920-04-03   85 y.o.   MRN: [id number] Visit Date: 03/27/2019  Today's Provider: Lelon Huh, MD   Chief Complaint  Patient presents with  . Hospitalization Follow-up   Subjective:     HPI  Follow up Hospitalization  Patient was admitted to [facility] on 02/28/2019 and discharged on 03/12/2019. He was treated for bilateral leg edema, fall, compression fx thoracic spine and hyponatremia. Treatment for this included diuresis, then restarting dose of lasix. Is now taking 40mg  every day.   He reports good compliance with treatment. He reports this condition is Improved. His breathing was much better upon discharge, but over the last week has been getting short of breath with exertion again.     Wt Readings from Last 3 Encounters:  03/27/19 198 lb (89.8 kg)  01/26/19 208 lb (94.3 kg)  01/11/19 201 lb 8 oz (91.4 kg)   He was also started on gabapentin for pain from vertebral compression fracture. His neighbor who accompanied him today states his pain has gotten much better since starting this medication.   However patient also reports pain around both sides of lower chest well for several weeks, possible even before his hospitalization. Not sure if it started before or after pain from compression fracture.   He also report pain on the back of his left heel for the last few weeks, which feels better when he puts pressure on it. Is worse when lies down at night on his side and sometimes keeps him awake.  ------------------------------------------------------------------------------------    No Known Allergies   Current Outpatient Medications:  .  alendronate (FOSAMAX) 70 MG tablet, Take 70 mg by mouth once a week. Take with a full glass of water on an empty stomach on Saturdays., Disp: , Rfl:  .  apixaban (ELIQUIS) 5 MG TABS tablet, Take 5 mg by mouth 2 (two) times daily., Disp: , Rfl:  .  atorvastatin (LIPITOR) 20 MG tablet, Take 10 mg by  mouth at bedtime. , Disp: , Rfl:  .  Calcium Carbonate-Vitamin D (CALCIUM 500 + D PO), Take 1 tablet by mouth daily., Disp: , Rfl:  .  cholecalciferol (VITAMIN D3) 25 MCG (1000 UT) tablet, Take 2,000 Units by mouth daily. , Disp: , Rfl:  .  ferrous sulfate 325 (65 FE) MG tablet, Take 1 tablet by mouth daily., Disp: , Rfl:  .  furosemide (LASIX) 40 MG tablet, Take 40 mg by mouth daily., Disp: , Rfl:  .  gabapentin (NEURONTIN) 100 MG capsule, Take 100 mg by mouth 2 (two) times daily., Disp: , Rfl:  .  isosorbide dinitrate (ISORDIL) 10 MG tablet, Take 1 tablet by mouth 2 (two) times daily., Disp: , Rfl:  .  Melatonin 3 MG TABS, Take 1 capsule by mouth at bedtime., Disp: , Rfl:  .  Multiple Vitamins-Minerals (MULTIVITAMIN ADULT PO), Take 1 tablet by mouth daily., Disp: , Rfl:  .  Polyethylene Glycol 3350 (MIRALAX PO), Take 1 Dose by mouth daily. Heaping teaspoon, Disp: , Rfl:  .  potassium chloride (K-DUR) 10 MEQ tablet, TAKE 1 TABLET BY MOUTH DAILY (Patient taking differently: Take 10 mEq by mouth daily. ), Disp: 30 tablet, Rfl: 11 .  Probiotic CAPS, Take 1 capsule by mouth daily., Disp: , Rfl:  .  Psyllium (METAMUCIL PO), Take 1 Dose by mouth daily., Disp: , Rfl:  .  pyridOXINE (VITAMIN B-6) 100 MG tablet, Take 100 mg by mouth daily.,  Disp: , Rfl:  .  tamsulosin (FLOMAX) 0.4 MG CAPS capsule, Take 0.4 mg by mouth 2 (two) times daily. , Disp: , Rfl:  .  Vitamin E 400 units TABS, Take 1 tablet by mouth daily., Disp: , Rfl:   Review of Systems  Constitutional: Negative.   Respiratory: Positive for shortness of breath.   Cardiovascular: Negative.   Musculoskeletal: Positive for arthralgias (left heel pain).  Skin: Positive for color change (in feet).    Social History   Tobacco Use  . Smoking status: Never Smoker  . Smokeless tobacco: Never Used  Substance Use Topics  . Alcohol use: Yes    Alcohol/week: 0.0 standard drinks    Comment: drinks 2 beers a month      Objective:   BP 102/62  (BP Location: Left Arm, Patient Position: Sitting, Cuff Size: Normal)   Pulse 72   Temp (!) 96.8 F (36 C) (Temporal)   Resp (!) 24 Comment: labored  Wt 198 lb (89.8 kg)   SpO2 98% Comment: room air  BMI 31.96 kg/m  Vitals:   03/27/19 1614  BP: 102/62  Pulse: 72  Resp: (!) 24  Temp: (!) 96.8 F (36 C)  TempSrc: Temporal  SpO2: 98%  Weight: 198 lb (89.8 kg)  Body mass index is 31.96 kg/m.   Physical Exam   General Appearance:    Overweight male in no acute distress  Eyes:    PERRL, conjunctiva/corneas clear, EOM's intact       Lungs:     Clear to auscultation bilaterally, respirations unlabored  Heart:    Normal heart rate. Irregularly irregular rhythm. No murmurs, rubs, or gallops.   MS:   All extremities are intact. 2+ bilateral lower leg edema.   Neurologic:   Awake, alert, oriented x 3. No apparent focal neurological           defect. Mild tender across upper abdomen across both sides. No focal tenderness, no rebound or guard. Tenderness is in musculature, no ribs or sternum.        No results found for any visits on 03/27/19.     Assessment & Plan    1. Acute on chronic congestive heart failure, unspecified heart failure type ([place]) Did well with in patient diuresis, but starting to have DOE again and increased edema this week. Is to double up on furosemide the next two days. Check  - Renal Function Panel - Magnesium  2. Non-traumatic compression fracture of L3 lumbar vertebra with routine healing, subsequent encounter Pain is greatly improved on gabapentin. To increase night time dose as below for heel pain  3. Pain of left heel Likely nerve pain as it is worse when he is off of it, and gets better when walking or applying pressure. Is to increase nighttime dose  - gabapentin (NEURONTIN) 100 MG capsule; Take one capsule every morning, and two every night before bed  4. Iron deficiency anemia, unspecified iron deficiency anemia type  - CBC  His companion  states that he had follow up with Dr. Rockey Situ this Friday but I don't see an appointment in [facility]. Advised to call to confirm appointment.       Lelon Huh, MD  [facility]

## 2019-03-28 DIAGNOSIS — I495 Sick sinus syndrome: Secondary | ICD-10-CM | POA: Diagnosis not present

## 2019-03-28 DIAGNOSIS — Z9181 History of falling: Secondary | ICD-10-CM | POA: Diagnosis not present

## 2019-03-28 DIAGNOSIS — I110 Hypertensive heart disease with heart failure: Secondary | ICD-10-CM | POA: Diagnosis not present

## 2019-03-28 DIAGNOSIS — M1990 Unspecified osteoarthritis, unspecified site: Secondary | ICD-10-CM | POA: Diagnosis not present

## 2019-03-28 DIAGNOSIS — Z960 Presence of urogenital implants: Secondary | ICD-10-CM | POA: Diagnosis not present

## 2019-03-28 DIAGNOSIS — Z7901 Long term (current) use of anticoagulants: Secondary | ICD-10-CM | POA: Diagnosis not present

## 2019-03-28 DIAGNOSIS — M48061 Spinal stenosis, lumbar region without neurogenic claudication: Secondary | ICD-10-CM | POA: Diagnosis not present

## 2019-03-28 DIAGNOSIS — E782 Mixed hyperlipidemia: Secondary | ICD-10-CM | POA: Diagnosis not present

## 2019-03-28 DIAGNOSIS — Z87891 Personal history of nicotine dependence: Secondary | ICD-10-CM | POA: Diagnosis not present

## 2019-03-28 DIAGNOSIS — R300 Dysuria: Secondary | ICD-10-CM | POA: Diagnosis not present

## 2019-03-28 DIAGNOSIS — I209 Angina pectoris, unspecified: Secondary | ICD-10-CM | POA: Diagnosis not present

## 2019-03-28 DIAGNOSIS — Z950 Presence of cardiac pacemaker: Secondary | ICD-10-CM | POA: Diagnosis not present

## 2019-03-28 DIAGNOSIS — I5023 Acute on chronic systolic (congestive) heart failure: Secondary | ICD-10-CM | POA: Diagnosis not present

## 2019-03-28 DIAGNOSIS — Z87440 Personal history of urinary (tract) infections: Secondary | ICD-10-CM | POA: Diagnosis not present

## 2019-03-28 DIAGNOSIS — I482 Chronic atrial fibrillation, unspecified: Secondary | ICD-10-CM | POA: Diagnosis not present

## 2019-03-28 DIAGNOSIS — M8008XD Age-related osteoporosis with current pathological fracture, vertebra(e), subsequent encounter for fracture with routine healing: Secondary | ICD-10-CM | POA: Diagnosis not present

## 2019-03-29 DIAGNOSIS — Z87440 Personal history of urinary (tract) infections: Secondary | ICD-10-CM | POA: Diagnosis not present

## 2019-03-29 DIAGNOSIS — R300 Dysuria: Secondary | ICD-10-CM | POA: Diagnosis not present

## 2019-03-29 DIAGNOSIS — I110 Hypertensive heart disease with heart failure: Secondary | ICD-10-CM | POA: Diagnosis not present

## 2019-03-29 DIAGNOSIS — I495 Sick sinus syndrome: Secondary | ICD-10-CM | POA: Diagnosis not present

## 2019-03-29 DIAGNOSIS — I482 Chronic atrial fibrillation, unspecified: Secondary | ICD-10-CM | POA: Diagnosis not present

## 2019-03-29 DIAGNOSIS — Z9181 History of falling: Secondary | ICD-10-CM | POA: Diagnosis not present

## 2019-03-29 DIAGNOSIS — M48061 Spinal stenosis, lumbar region without neurogenic claudication: Secondary | ICD-10-CM | POA: Diagnosis not present

## 2019-03-29 DIAGNOSIS — I5023 Acute on chronic systolic (congestive) heart failure: Secondary | ICD-10-CM | POA: Diagnosis not present

## 2019-03-29 DIAGNOSIS — D509 Iron deficiency anemia, unspecified: Secondary | ICD-10-CM | POA: Diagnosis not present

## 2019-03-29 DIAGNOSIS — M8008XD Age-related osteoporosis with current pathological fracture, vertebra(e), subsequent encounter for fracture with routine healing: Secondary | ICD-10-CM | POA: Diagnosis not present

## 2019-03-29 DIAGNOSIS — I209 Angina pectoris, unspecified: Secondary | ICD-10-CM | POA: Diagnosis not present

## 2019-03-29 DIAGNOSIS — E782 Mixed hyperlipidemia: Secondary | ICD-10-CM | POA: Diagnosis not present

## 2019-03-29 DIAGNOSIS — I509 Heart failure, unspecified: Secondary | ICD-10-CM | POA: Diagnosis not present

## 2019-03-29 DIAGNOSIS — Z960 Presence of urogenital implants: Secondary | ICD-10-CM | POA: Diagnosis not present

## 2019-03-29 DIAGNOSIS — Z87891 Personal history of nicotine dependence: Secondary | ICD-10-CM | POA: Diagnosis not present

## 2019-03-29 DIAGNOSIS — Z950 Presence of cardiac pacemaker: Secondary | ICD-10-CM | POA: Diagnosis not present

## 2019-03-29 DIAGNOSIS — M1990 Unspecified osteoarthritis, unspecified site: Secondary | ICD-10-CM | POA: Diagnosis not present

## 2019-03-29 DIAGNOSIS — Z7901 Long term (current) use of anticoagulants: Secondary | ICD-10-CM | POA: Diagnosis not present

## 2019-03-30 ENCOUNTER — Telehealth: Payer: Self-pay | Admitting: *Deleted

## 2019-03-30 ENCOUNTER — Telehealth: Payer: Self-pay | Admitting: Cardiovascular Disease

## 2019-03-30 LAB — RENAL FUNCTION PANEL
Albumin: 4.1 g/dL (ref 3.5–4.6)
BUN/Creatinine Ratio: 29 — ABNORMAL HIGH (ref 10–24)
BUN: 25 mg/dL (ref 10–36)
CO2: 24 mmol/L (ref 20–29)
Calcium: 9.7 mg/dL (ref 8.6–10.2)
Chloride: 102 mmol/L (ref 96–106)
Creatinine, Ser: 0.86 mg/dL (ref 0.76–1.27)
GFR calc Af Amer: 84 mL/min/{1.73_m2} (ref >59)
GFR calc non Af Amer: 73 mL/min/{1.73_m2} (ref >59)
Glucose: 109 mg/dL — ABNORMAL HIGH (ref 65–99)
Phosphorus: 4.2 mg/dL — ABNORMAL HIGH (ref 2.8–4.1)
Potassium: 4.5 mmol/L (ref 3.5–5.2)
Sodium: 139 mmol/L (ref 134–144)

## 2019-03-30 LAB — CBC
Hematocrit: 38.8 % (ref 37.5–51.0)
Hemoglobin: 13.4 g/dL (ref 13.0–17.7)
MCH: 34.5 pg — ABNORMAL HIGH (ref 26.6–33.0)
MCHC: 34.5 g/dL (ref 31.5–35.7)
MCV: 100 fL — ABNORMAL HIGH (ref 79–97)
Platelets: 247 10*3/uL (ref 150–450)
RBC: 3.88 x10E6/uL — ABNORMAL LOW (ref 4.14–5.80)
RDW: 11.9 % (ref 11.6–15.4)
WBC: 7.5 10*3/uL (ref 3.4–10.8)

## 2019-03-30 LAB — MAGNESIUM: Magnesium: 2.4 mg/dL — ABNORMAL HIGH (ref 1.6–2.3)

## 2019-03-30 NOTE — Telephone Encounter (Signed)
Patient caths  two times daily.  He states his going more often ,  We will see him on Monday . Advised  If he cant go over the weekend go to the er.

## 2019-03-30 NOTE — Telephone Encounter (Signed)
Returned call to [place], NP from [place]. Pt c/o swelling, 6 lb weight gain and exertional SOB worsened over the past week since hospital d/c. She saw pt in his home yesterday.   Recent inc Lasix to 80/daily. R lung base with decreased lung sounds.  Has OV with [facility] on on 12/14. VS BP 108/70 HR 60 o2 93-94% rest, walking o2 97% on room air.  Labs taken yesterday, BUN and Cr WNL. No elevation in WBC.

## 2019-03-30 NOTE — Telephone Encounter (Signed)
Pt c/o swelling: STAT is pt has developed SOB within 24 hours  1) How much weight have you gained and in what time span? 6 pounds in 2 weeks  2) If swelling, where is the swelling located?  Bilateral feet, legs, and ankles 3) Are you currently taking a fluid pill? Yes, PCP increased  4) Are you currently SOB? Yes with activity  Do you have a log of your daily weights (if so, list)? Consistently going up  5) Have you gained 3 pounds in a day or 5 pounds in a week? yes  Have you traveled recently? No  Right lower lung not much air moving

## 2019-04-01 NOTE — Telephone Encounter (Signed)
[place] patient right? They need to call Lowella Petties unless you guys see where the patient is established with our clinic

## 2019-04-02 ENCOUNTER — Encounter: Payer: Self-pay | Admitting: Urology

## 2019-04-02 ENCOUNTER — Other Ambulatory Visit: Payer: Self-pay | Admitting: Family Medicine

## 2019-04-02 ENCOUNTER — Other Ambulatory Visit: Payer: Self-pay

## 2019-04-02 ENCOUNTER — Ambulatory Visit: Payer: Medicare Other | Admitting: Urology

## 2019-04-02 VITALS — BP 101/68 | HR 74 | Ht 66.0 in | Wt 193.0 lb

## 2019-04-02 DIAGNOSIS — N138 Other obstructive and reflux uropathy: Secondary | ICD-10-CM

## 2019-04-02 DIAGNOSIS — Z960 Presence of urogenital implants: Secondary | ICD-10-CM | POA: Diagnosis not present

## 2019-04-02 DIAGNOSIS — M4856XD Collapsed vertebra, not elsewhere classified, lumbar region, subsequent encounter for fracture with routine healing: Secondary | ICD-10-CM

## 2019-04-02 DIAGNOSIS — R32 Unspecified urinary incontinence: Secondary | ICD-10-CM | POA: Diagnosis not present

## 2019-04-02 DIAGNOSIS — N401 Enlarged prostate with lower urinary tract symptoms: Secondary | ICD-10-CM

## 2019-04-02 DIAGNOSIS — I495 Sick sinus syndrome: Secondary | ICD-10-CM | POA: Diagnosis not present

## 2019-04-02 DIAGNOSIS — M8008XD Age-related osteoporosis with current pathological fracture, vertebra(e), subsequent encounter for fracture with routine healing: Secondary | ICD-10-CM | POA: Diagnosis not present

## 2019-04-02 DIAGNOSIS — E782 Mixed hyperlipidemia: Secondary | ICD-10-CM | POA: Diagnosis not present

## 2019-04-02 DIAGNOSIS — Z950 Presence of cardiac pacemaker: Secondary | ICD-10-CM | POA: Diagnosis not present

## 2019-04-02 DIAGNOSIS — I209 Angina pectoris, unspecified: Secondary | ICD-10-CM | POA: Diagnosis not present

## 2019-04-02 DIAGNOSIS — R300 Dysuria: Secondary | ICD-10-CM | POA: Diagnosis not present

## 2019-04-02 DIAGNOSIS — Z9181 History of falling: Secondary | ICD-10-CM | POA: Diagnosis not present

## 2019-04-02 DIAGNOSIS — M1990 Unspecified osteoarthritis, unspecified site: Secondary | ICD-10-CM | POA: Diagnosis not present

## 2019-04-02 DIAGNOSIS — Z7901 Long term (current) use of anticoagulants: Secondary | ICD-10-CM | POA: Diagnosis not present

## 2019-04-02 DIAGNOSIS — I482 Chronic atrial fibrillation, unspecified: Secondary | ICD-10-CM | POA: Diagnosis not present

## 2019-04-02 DIAGNOSIS — I5023 Acute on chronic systolic (congestive) heart failure: Secondary | ICD-10-CM | POA: Diagnosis not present

## 2019-04-02 DIAGNOSIS — Z87891 Personal history of nicotine dependence: Secondary | ICD-10-CM | POA: Diagnosis not present

## 2019-04-02 DIAGNOSIS — I110 Hypertensive heart disease with heart failure: Secondary | ICD-10-CM | POA: Diagnosis not present

## 2019-04-02 DIAGNOSIS — M79672 Pain in left foot: Secondary | ICD-10-CM

## 2019-04-02 DIAGNOSIS — M48061 Spinal stenosis, lumbar region without neurogenic claudication: Secondary | ICD-10-CM | POA: Diagnosis not present

## 2019-04-02 DIAGNOSIS — Z87440 Personal history of urinary (tract) infections: Secondary | ICD-10-CM | POA: Diagnosis not present

## 2019-04-02 LAB — URINALYSIS, COMPLETE
Bilirubin, UA: NEGATIVE
Glucose, UA: NEGATIVE
Ketones, UA: NEGATIVE
Leukocytes,UA: NEGATIVE
Nitrite, UA: NEGATIVE
Protein,UA: NEGATIVE
RBC, UA: NEGATIVE
Specific Gravity, UA: 1.02 (ref 1.005–1.030)
Urobilinogen, Ur: 0.2 mg/dL (ref 0.2–1.0)
pH, UA: 7 (ref 5.0–7.5)

## 2019-04-02 LAB — MICROSCOPIC EXAMINATION: RBC, Urine: NONE SEEN /hpf (ref 0–2)

## 2019-04-02 LAB — BLADDER SCAN AMB NON-IMAGING: Scan Result: 364

## 2019-04-02 NOTE — Progress Notes (Signed)
   04/02/2019 4:30 PM   [name] 08-22-1919 [id number]  Reason for visit: Follow up BPH  HPI: I saw Mr. Willert and his wife back in urology clinic for follow-up of BPH.  He has a history of prostate cancer treated with radiation over 30 years ago.  He was also followed at the [facility] long-term as well.  He has been managing his bladder with CIC twice daily, and is voiding small amounts in between.  He was recently hospitalized at [facility] in early November for exacerbation of congestive heart failure with shortness of breath, and his Lasix was increased.  He also has some dementia which makes the history difficult to obtain.  It sounds like he is urinating more frequently, and he feels he is not getting as much out with intermittent catheterization.  We discussed the relationship between Lasix and increased urine output at length.  His bladder scan in clinic today was originally 600 mL, and he was able to void 300 mL with a PVR of 300 mL.  Our nurse also watched him perform CIC appropriately in clinic and he was able to drain the 300 mL left behind.  We provided him with some new super slick catheters to ease passage into the bladder.  We discussed other options including a chronic indwelling Foley, but he is very resistant to this.  Renal function was normal on 03/29/2019 with a creatinine of 0.86, EGFR >60.  -Continue CIC 2-3 times daily.  We also discussed timed voiding to try to prevent over distention. -With his age and dementia, may need to transition to a chronic Foley if he is having difficulty passing a catheter or remembering to perform CIC -RTC 6 months for symptom check, sooner if any problems  A total of 25 minutes were spent face-to-face with the patient, greater than 50% was spent in patient education, counseling, and coordination of care regarding BPH, incomplete bladder emptying, and CIC.  Billey Co, [facility] [address] [place], [place] [address] [phone]

## 2019-04-02 NOTE — Telephone Encounter (Signed)
Patient does have upcoming appointment scheduled with Dr. Garen Lah on 04/06/19.

## 2019-04-02 NOTE — Telephone Encounter (Signed)
Likely scheduling mistake... Someone please call family /patient find out  Suggest he follow up with [facility] unless he is switching clinics

## 2019-04-03 ENCOUNTER — Ambulatory Visit: Payer: Medicare Other | Admitting: Cardiovascular Disease

## 2019-04-03 ENCOUNTER — Telehealth: Payer: Self-pay

## 2019-04-03 DIAGNOSIS — I495 Sick sinus syndrome: Secondary | ICD-10-CM | POA: Diagnosis not present

## 2019-04-03 NOTE — Telephone Encounter (Signed)
Patient notified

## 2019-04-03 NOTE — Telephone Encounter (Signed)
Left voicemail message with patient and then called [facility] as well. Spoke with Karl Luke at the [facility] and reviewed that this patient is not our patient and just wanted to clarify because we are a different practice. She will pass note over to the provider there and will follow up to see if he needs to be seen by [place] or his primary cardiologist at [facility] location.

## 2019-04-03 NOTE — Telephone Encounter (Signed)
Spoke with [name] NP at the [place] and she reports that patient was wanting to switch providers to our office and that was the reason for the call. Patient does have appointment scheduled and will call and confirm this with his caregiver. She was appreciative for the information and had no further questions.

## 2019-04-03 NOTE — Telephone Encounter (Signed)
-----   Message from Billey Co, MD sent at 04/02/2019  4:40 PM EST ----- Patient that CICs, no evidence of infection, no need for abx  Nickolas Madrid, MD 04/02/2019

## 2019-04-04 ENCOUNTER — Other Ambulatory Visit: Payer: Self-pay | Admitting: *Deleted

## 2019-04-04 DIAGNOSIS — Z960 Presence of urogenital implants: Secondary | ICD-10-CM | POA: Diagnosis not present

## 2019-04-04 DIAGNOSIS — I110 Hypertensive heart disease with heart failure: Secondary | ICD-10-CM | POA: Diagnosis not present

## 2019-04-04 DIAGNOSIS — R300 Dysuria: Secondary | ICD-10-CM | POA: Diagnosis not present

## 2019-04-04 DIAGNOSIS — M8008XD Age-related osteoporosis with current pathological fracture, vertebra(e), subsequent encounter for fracture with routine healing: Secondary | ICD-10-CM | POA: Diagnosis not present

## 2019-04-04 DIAGNOSIS — M48061 Spinal stenosis, lumbar region without neurogenic claudication: Secondary | ICD-10-CM | POA: Diagnosis not present

## 2019-04-04 DIAGNOSIS — Z7901 Long term (current) use of anticoagulants: Secondary | ICD-10-CM | POA: Diagnosis not present

## 2019-04-04 DIAGNOSIS — E782 Mixed hyperlipidemia: Secondary | ICD-10-CM | POA: Diagnosis not present

## 2019-04-04 DIAGNOSIS — I5023 Acute on chronic systolic (congestive) heart failure: Secondary | ICD-10-CM | POA: Diagnosis not present

## 2019-04-04 DIAGNOSIS — M4856XD Collapsed vertebra, not elsewhere classified, lumbar region, subsequent encounter for fracture with routine healing: Secondary | ICD-10-CM

## 2019-04-04 DIAGNOSIS — M79672 Pain in left foot: Secondary | ICD-10-CM

## 2019-04-04 DIAGNOSIS — Z950 Presence of cardiac pacemaker: Secondary | ICD-10-CM | POA: Diagnosis not present

## 2019-04-04 DIAGNOSIS — Z9181 History of falling: Secondary | ICD-10-CM | POA: Diagnosis not present

## 2019-04-04 DIAGNOSIS — M1990 Unspecified osteoarthritis, unspecified site: Secondary | ICD-10-CM | POA: Diagnosis not present

## 2019-04-04 DIAGNOSIS — I495 Sick sinus syndrome: Secondary | ICD-10-CM | POA: Diagnosis not present

## 2019-04-04 DIAGNOSIS — I209 Angina pectoris, unspecified: Secondary | ICD-10-CM | POA: Diagnosis not present

## 2019-04-04 DIAGNOSIS — Z87440 Personal history of urinary (tract) infections: Secondary | ICD-10-CM | POA: Diagnosis not present

## 2019-04-04 DIAGNOSIS — I482 Chronic atrial fibrillation, unspecified: Secondary | ICD-10-CM | POA: Diagnosis not present

## 2019-04-04 DIAGNOSIS — Z87891 Personal history of nicotine dependence: Secondary | ICD-10-CM | POA: Diagnosis not present

## 2019-04-04 LAB — CULTURE, URINE COMPREHENSIVE

## 2019-04-04 NOTE — Telephone Encounter (Signed)
Pharmacy called concerning rx for gabapentin 300 mg. Rx was sent to [facility] 04/02/2019. Directions were supposed to be changed to read 1 capsule in the morning and 2 capsules in the evening. See ov note from 03/27/2019. Please advise?

## 2019-04-04 NOTE — Telephone Encounter (Signed)
Spoke with patient and confirmed his appointment this week with our office and recommended that he show up early so that it will allow him time to get down here to our office. He verbalized understanding with no further questions.

## 2019-04-04 NOTE — Telephone Encounter (Signed)
Ok. Did he pick up the prescription from 12-7? If so I can send in new prescription with new directions and have pharmacy put on hold until he runs out. Otherwise will have them go ahead and dispense new prescription.

## 2019-04-05 DIAGNOSIS — Z950 Presence of cardiac pacemaker: Secondary | ICD-10-CM | POA: Diagnosis not present

## 2019-04-05 DIAGNOSIS — Z87440 Personal history of urinary (tract) infections: Secondary | ICD-10-CM | POA: Diagnosis not present

## 2019-04-05 DIAGNOSIS — I110 Hypertensive heart disease with heart failure: Secondary | ICD-10-CM | POA: Diagnosis not present

## 2019-04-05 DIAGNOSIS — I209 Angina pectoris, unspecified: Secondary | ICD-10-CM | POA: Diagnosis not present

## 2019-04-05 DIAGNOSIS — R300 Dysuria: Secondary | ICD-10-CM | POA: Diagnosis not present

## 2019-04-05 DIAGNOSIS — Z960 Presence of urogenital implants: Secondary | ICD-10-CM | POA: Diagnosis not present

## 2019-04-05 DIAGNOSIS — Z87891 Personal history of nicotine dependence: Secondary | ICD-10-CM | POA: Diagnosis not present

## 2019-04-05 DIAGNOSIS — E782 Mixed hyperlipidemia: Secondary | ICD-10-CM | POA: Diagnosis not present

## 2019-04-05 DIAGNOSIS — I495 Sick sinus syndrome: Secondary | ICD-10-CM | POA: Diagnosis not present

## 2019-04-05 DIAGNOSIS — M48061 Spinal stenosis, lumbar region without neurogenic claudication: Secondary | ICD-10-CM | POA: Diagnosis not present

## 2019-04-05 DIAGNOSIS — M1990 Unspecified osteoarthritis, unspecified site: Secondary | ICD-10-CM | POA: Diagnosis not present

## 2019-04-05 DIAGNOSIS — Z7901 Long term (current) use of anticoagulants: Secondary | ICD-10-CM | POA: Diagnosis not present

## 2019-04-05 DIAGNOSIS — Z9181 History of falling: Secondary | ICD-10-CM | POA: Diagnosis not present

## 2019-04-05 DIAGNOSIS — I5023 Acute on chronic systolic (congestive) heart failure: Secondary | ICD-10-CM | POA: Diagnosis not present

## 2019-04-05 DIAGNOSIS — M8008XD Age-related osteoporosis with current pathological fracture, vertebra(e), subsequent encounter for fracture with routine healing: Secondary | ICD-10-CM | POA: Diagnosis not present

## 2019-04-05 DIAGNOSIS — I482 Chronic atrial fibrillation, unspecified: Secondary | ICD-10-CM | POA: Diagnosis not present

## 2019-04-05 MED ORDER — GABAPENTIN 100 MG PO CAPS: ORAL_CAPSULE | ORAL | 5 refills | Status: AC

## 2019-04-05 NOTE — Telephone Encounter (Signed)
Pt and family called in to check the status. They are stating that provider changed pt to 3 pills a day, pharmacy is requesting an updated Rx, they would like to have sent to [place]

## 2019-04-05 NOTE — Telephone Encounter (Signed)
I called and spoke with pharmacist. She advised me that patient has not picked up prescription that was sent in on 12/07. I cancelled that prescription and sent in the new prescription with different instructions. Patient advised.

## 2019-04-06 ENCOUNTER — Ambulatory Visit (INDEPENDENT_AMBULATORY_CARE_PROVIDER_SITE_OTHER): Payer: Medicare Other | Admitting: Cardiology

## 2019-04-06 ENCOUNTER — Other Ambulatory Visit: Payer: Self-pay

## 2019-04-06 ENCOUNTER — Encounter: Payer: Self-pay | Admitting: Cardiology

## 2019-04-06 ENCOUNTER — Encounter

## 2019-04-06 VITALS — BP 117/79 | HR 75 | Temp 97.1°F | Ht 66.0 in | Wt 192.4 lb

## 2019-04-06 DIAGNOSIS — I480 Paroxysmal atrial fibrillation: Secondary | ICD-10-CM | POA: Diagnosis not present

## 2019-04-06 DIAGNOSIS — I495 Sick sinus syndrome: Secondary | ICD-10-CM

## 2019-04-06 DIAGNOSIS — I5022 Chronic systolic (congestive) heart failure: Secondary | ICD-10-CM | POA: Diagnosis not present

## 2019-04-06 MED ORDER — FUROSEMIDE 40 MG PO TABS: 80.0000 mg | ORAL_TABLET | Freq: Two times a day (BID) | ORAL | 3 refills | Status: DC

## 2019-04-06 NOTE — Patient Instructions (Signed)
Medication Instructions:  Your physician has recommended you make the following change in your medication:  1. STOP Isosorbide mononitrate 2. INCREASE Furosemide 40 mg take 2 tablets (80 mg) twice daily  *If you need a refill on your cardiac medications before your next appointment, please call your pharmacy*  Lab Work: BMP in one week. Go to [facility] to have labs done. Check in at the front desk and they will tell you where to go.  If you have labs (blood work) drawn today and your tests are completely normal, you will receive your results only by: Marland Kitchen MyChart Message (if you have MyChart) OR . A paper copy in the mail If you have any lab test that is abnormal or we need to change your treatment, we will call you to review the results.  Testing/Procedures: None  Follow-Up: At [facility] you and your health needs are our priority.  As part of our continuing mission to provide you with exceptional heart care, we have created designated Provider Care Teams.  These Care Teams include your primary Cardiologist (physician) and Advanced Practice Providers (APPs -  Physician Assistants and Nurse Practitioners) who all work together to provide you with the care you need, when you need it.  Your next appointment:   1 week(s)  The format for your next appointment:   In Person  Provider:    You may see Dr. Garen Lah or one of the following Advanced Practice Providers on your designated Care Team:    Murray Hodgkins, NP  [name], PA-C  Marrianne Mood, PA-C

## 2019-04-06 NOTE — Progress Notes (Signed)
Cardiology Office Note:    Date:  04/06/2019   ID:  Douglas Calhoun, DOB 07-27-1919, MRN [id number]  PCP:  Birdie Sons, MD  Cardiologist:  Kate Sable, MD  Electrophysiologist:  None   Referring MD: Birdie Sons, MD   Chief Complaint  Patient presents with  . New Patient (Initial Visit)    Patient was at [facility] with CHF and since has noticed a 10 lb weight gain, shortness of breath and LE edema. Meds reviewed by the pt.'s friend.     History of Present Illness:    Douglas Calhoun is a 85 y.o. male with a hx of atrial fibrillation, sick sinus syndrome status post leadless pacemaker 01/11/2019,  heart failure reduced ejection fraction, last EF 25% on 02/05/2019, who presents to establish care and also due to shortness of breath, lower extremity edema and 10 pound weight gain.  Patient was diagnosed with lumbar compression fracture roughly a month ago.  He was admitted at [facility].  Surgery was not performed due to his age and medical management recommended.  He was noted to be fluid overloaded at the time and was adequately diuresed.  On discharge, his weight was 180 pounds.  Since discharge, patient has noticed progressive lower extremity swelling, and weight gain.  He saw his primary care provider who increased his Lasix dose to 80 mg daily.  He has been taking Lasix at this dose for the past 4 days without improvement in symptoms.  He has urinary obstruction and self caths themselves.  He takes Eliquis for atrial fibrillation.  He denies any history of heart attacks or CAD.  He was previously seen by Dr. Nehemiah Massed at [facility].  Last echocardiogram date 02/05/2019 revealed severely reduced ejection fraction with EF 25%.  Moderately dilated left ventricle, moderately dilated right ventricle.  Past Medical History:  Diagnosis Date  . Achilles tendinitis   . Arrhythmia    atrial fibrillation  . Cataract   . CHF (congestive heart failure) ([place])   . Hearing loss   . Herpes  zoster without complication [id number]  . Personal history of prostate cancer 10/08/2014   s/p 15 radiation treatments treated by Dr. Madelin Headings   . Varicose vein of leg     Past Surgical History:  Procedure Laterality Date  . APPENDECTOMY  1960's  . BREAST SURGERY     Breast Biopsy prior to 1960  . CATARACT EXTRACTION Left   . PACEMAKER LEADLESS INSERTION N/A 01/11/2019   Procedure: PACEMAKER LEADLESS INSERTION;  Surgeon: Isaias Cowman, MD;  Location: [place] [facility];  Service: Cardiovascular;  Laterality: N/A;    Current Medications: Current Meds  Medication Sig  . alendronate (FOSAMAX) 70 MG tablet Take 70 mg by mouth once a week. Take with a full glass of water on an empty stomach on Saturdays.  Marland Kitchen apixaban (ELIQUIS) 5 MG TABS tablet Take 5 mg by mouth 2 (two) times daily.  . Calcium Carbonate-Vitamin D (CALCIUM 500 + D PO) Take 1 tablet by mouth daily.  . cholecalciferol (VITAMIN D3) 25 MCG (1000 UT) tablet Take 2,000 Units by mouth daily.   . ferrous sulfate 325 (65 FE) MG tablet Take 1 tablet by mouth daily.  . furosemide (LASIX) 40 MG tablet Take 2 tablets (80 mg total) by mouth 2 (two) times daily.  Marland Kitchen gabapentin (NEURONTIN) 100 MG capsule TAKE 1 CAPSULE IN THE MORNING AND TWO AT NIGHT  . Melatonin 3 MG TABS Take 1 capsule by mouth at bedtime.  Marland Kitchen  Multiple Vitamins-Minerals (MULTIVITAMIN ADULT PO) Take 1 tablet by mouth daily.  . Polyethylene Glycol 3350 (MIRALAX PO) Take 1 Dose by mouth daily. Heaping teaspoon  . potassium chloride (K-DUR) 10 MEQ tablet TAKE 1 TABLET BY MOUTH DAILY (Patient taking differently: Take 10 mEq by mouth daily. )  . Probiotic CAPS Take 1 capsule by mouth daily.  . Psyllium (METAMUCIL PO) Take 1 Dose by mouth daily.  Marland Kitchen pyridOXINE (VITAMIN B-6) 100 MG tablet Take 100 mg by mouth daily.  . tamsulosin (FLOMAX) 0.4 MG CAPS capsule Take 0.4 mg by mouth 2 (two) times daily.   . Vitamin E 400 units TABS Take 1 tablet by mouth daily.  . [DISCONTINUED]  furosemide (LASIX) 40 MG tablet Take 40 mg by mouth 2 (two) times daily.   . [DISCONTINUED] isosorbide dinitrate (ISORDIL) 10 MG tablet Take 1 tablet by mouth 2 (two) times daily.     Allergies:   Patient has no known allergies.   Social History   Socioeconomic History  . Marital status: Married    Spouse name: Not on file  . Number of children: Not on file  . Years of education: Not on file  . Highest education level: Not on file  Occupational History  . Occupation: retired  Tobacco Use  . Smoking status: Never Smoker  . Smokeless tobacco: Never Used  Substance and Sexual Activity  . Alcohol use: Yes    Alcohol/week: 0.0 standard drinks    Comment: drinks 2 beers a month  . Drug use: No  . Sexual activity: Not on file  Other Topics Concern  . Not on file  Social History Narrative   Lives along at [place] Strain:   . Difficulty of Paying Living Expenses: Not on file  Food Insecurity:   . Worried About Charity fundraiser in the Last Year: Not on file  . Ran Out of Food in the Last Year: Not on file  Transportation Needs:   . Lack of Transportation (Medical): Not on file  . Lack of Transportation (Non-Medical): Not on file  Physical Activity:   . Days of Exercise per Week: Not on file  . Minutes of Exercise per Session: Not on file  Stress:   . Feeling of Stress : Not on file  Social Connections:   . Frequency of Communication with Friends and Family: Not on file  . Frequency of Social Gatherings with Friends and Family: Not on file  . Attends Religious Services: Not on file  . Active Member of Clubs or Organizations: Not on file  . Attends Archivist Meetings: Not on file  . Marital Status: Not on file     Family History: The patient's family history includes Heart disease in his mother; Stroke in his mother.  ROS:   Please see the history of present illness.     All other systems reviewed and are  negative.  EKGs/Labs/Other Studies Reviewed:    The following studies were reviewed today: TTEchocardiogram date 02/05/2019 revealed severely reduced ejection fraction with EF 25%.   Moderately dilated left ventricle,  moderately dilated right ventricle. Moderate MR Moderate TR  EKG:  EKG is  ordered today.  The ekg ordered today demonstrates atrial fibrillation, occasional PVCs.  Recent Labs: 03/29/2019: BUN 25; Creatinine, Ser 0.86; Hemoglobin 13.4; Magnesium 2.4; Platelets 247; Potassium 4.5; Sodium 139  Recent Lipid Panel    Component Value Date/Time   CHOL 231 (A)  03/06/2013 0000   TRIG 98 03/06/2013 0000   HDL 78 (A) 03/06/2013 0000   LDLCALC 133 03/06/2013 0000    Physical Exam:    VS:  BP 117/79 (BP Location: Right Arm, Patient Position: Sitting, Cuff Size: Normal)   Pulse 75   Temp (!) 97.1 F (36.2 C)   Ht 5\' 6"  (1.676 m)   Wt 192 lb 6 oz (87.3 kg)   SpO2 97%   BMI 31.05 kg/m     Wt Readings from Last 3 Encounters:  04/06/19 192 lb 6 oz (87.3 kg)  04/02/19 193 lb (87.5 kg)  03/27/19 198 lb (89.8 kg)     GEN:  Well nourished, well developed in mild distress due to back pain, frail looking HEENT: Normal NECK: No JVD; No carotid bruits LYMPHATICS: No lymphadenopathy CARDIAC: Irregular irregular, no murmurs, rubs, gallops RESPIRATORY:  Clear to auscultation without rales, wheezing or rhonchi  ABDOMEN: Soft, non-tender, non-distended MUSCULOSKELETAL:  2+ edema; No deformity  SKIN: Warm and dry NEUROLOGIC:  Alert and oriented x 3 PSYCHIATRIC:  Normal affect   ASSESSMENT:   Severely reduced EF, EF 25%.  Lower extremity edema on physical exam. 1. Chronic systolic heart failure (HCC)   2. Paroxysmal atrial fibrillation ([place])   3. Sick sinus syndrome (HCC)    PLAN:    In order of problems listed above:  1. Increase Lasix to 80 mg twice daily.  Check BMP in 1 week.  Monitor daily weights.  Patient's dry weight is around 180 pounds.  Patient will call  office in 5 days with weight readings.  If patient is not adequately diuresed, we will plan on switching patient to torsemide.  And if that does not work, we will plan on adding metolazone an hour before loop diuretic.  Stop Isordil to give more room for diuresing. 2. Heart rate controlled.  Continue Eliquis. 3. Sinus syndrome status post leadless pacemaker.  Will have patient establish care with device clinic for frequent checks.  Follow-up in 1 week  This note was generated in part or whole with voice recognition software. Voice recognition is usually quite accurate but there are transcription errors that can and very often do occur. I apologize for any typographical errors that were not detected and corrected.  Medication Adjustments/Labs and Tests Ordered: Current medicines are reviewed at length with the patient today.  Concerns regarding medicines are outlined above.  Orders Placed This Encounter  Procedures  . Basic metabolic panel  . EKG 12-Lead   Meds ordered this encounter  Medications  . furosemide (LASIX) 40 MG tablet    Sig: Take 2 tablets (80 mg total) by mouth 2 (two) times daily.    Dispense:  360 tablet    Refill:  3    Patient Instructions  Medication Instructions:  Your physician has recommended you make the following change in your medication:  1. STOP Isosorbide mononitrate 2. INCREASE Furosemide 40 mg take 2 tablets (80 mg) twice daily  *If you need a refill on your cardiac medications before your next appointment, please call your pharmacy*  Lab Work: BMP in one week. Go to [facility] to have labs done. Check in at the front desk and they will tell you where to go.  If you have labs (blood work) drawn today and your tests are completely normal, you will receive your results only by: Marland Kitchen MyChart Message (if you have MyChart) OR . A paper copy in the mail If you have any lab  test that is abnormal or we need to change your treatment, we will call you to  review the results.  Testing/Procedures: None  Follow-Up: At [facility], you and your health needs are our priority.  As part of our continuing mission to provide you with exceptional heart care, we have created designated Provider Care Teams.  These Care Teams include your primary Cardiologist (physician) and Advanced Practice Providers (APPs -  Physician Assistants and Nurse Practitioners) who all work together to provide you with the care you need, when you need it.  Your next appointment:   1 week(s)  The format for your next appointment:   In Person  Provider:    You may see Dr. Garen Lah or one of the following Advanced Practice Providers on your designated Care Team:    Murray Hodgkins, NP  [name], PA-C  Marrianne Mood, PA-C       Signed, Kate Sable, MD  04/06/2019 12:37 PM    Douglas Calhoun

## 2019-04-09 ENCOUNTER — Ambulatory Visit: Payer: Medicare Other | Admitting: Cardiovascular Disease

## 2019-04-09 DIAGNOSIS — Z960 Presence of urogenital implants: Secondary | ICD-10-CM | POA: Diagnosis not present

## 2019-04-09 DIAGNOSIS — M48061 Spinal stenosis, lumbar region without neurogenic claudication: Secondary | ICD-10-CM | POA: Diagnosis not present

## 2019-04-09 DIAGNOSIS — Z87440 Personal history of urinary (tract) infections: Secondary | ICD-10-CM | POA: Diagnosis not present

## 2019-04-09 DIAGNOSIS — I209 Angina pectoris, unspecified: Secondary | ICD-10-CM | POA: Diagnosis not present

## 2019-04-09 DIAGNOSIS — Z87891 Personal history of nicotine dependence: Secondary | ICD-10-CM | POA: Diagnosis not present

## 2019-04-09 DIAGNOSIS — M8008XD Age-related osteoporosis with current pathological fracture, vertebra(e), subsequent encounter for fracture with routine healing: Secondary | ICD-10-CM | POA: Diagnosis not present

## 2019-04-09 DIAGNOSIS — I5023 Acute on chronic systolic (congestive) heart failure: Secondary | ICD-10-CM | POA: Diagnosis not present

## 2019-04-09 DIAGNOSIS — I110 Hypertensive heart disease with heart failure: Secondary | ICD-10-CM | POA: Diagnosis not present

## 2019-04-09 DIAGNOSIS — E782 Mixed hyperlipidemia: Secondary | ICD-10-CM | POA: Diagnosis not present

## 2019-04-09 DIAGNOSIS — Z950 Presence of cardiac pacemaker: Secondary | ICD-10-CM | POA: Diagnosis not present

## 2019-04-09 DIAGNOSIS — I495 Sick sinus syndrome: Secondary | ICD-10-CM | POA: Diagnosis not present

## 2019-04-09 DIAGNOSIS — R300 Dysuria: Secondary | ICD-10-CM | POA: Diagnosis not present

## 2019-04-09 DIAGNOSIS — I482 Chronic atrial fibrillation, unspecified: Secondary | ICD-10-CM | POA: Diagnosis not present

## 2019-04-09 DIAGNOSIS — M1990 Unspecified osteoarthritis, unspecified site: Secondary | ICD-10-CM | POA: Diagnosis not present

## 2019-04-09 DIAGNOSIS — Z7901 Long term (current) use of anticoagulants: Secondary | ICD-10-CM | POA: Diagnosis not present

## 2019-04-09 DIAGNOSIS — Z9181 History of falling: Secondary | ICD-10-CM | POA: Diagnosis not present

## 2019-04-10 ENCOUNTER — Telehealth: Payer: Self-pay | Admitting: Family Medicine

## 2019-04-10 ENCOUNTER — Telehealth: Payer: Self-pay | Admitting: Cardiology

## 2019-04-10 DIAGNOSIS — I5023 Acute on chronic systolic (congestive) heart failure: Secondary | ICD-10-CM | POA: Diagnosis not present

## 2019-04-10 DIAGNOSIS — M8008XD Age-related osteoporosis with current pathological fracture, vertebra(e), subsequent encounter for fracture with routine healing: Secondary | ICD-10-CM | POA: Diagnosis not present

## 2019-04-10 DIAGNOSIS — M1990 Unspecified osteoarthritis, unspecified site: Secondary | ICD-10-CM | POA: Diagnosis not present

## 2019-04-10 DIAGNOSIS — Z87440 Personal history of urinary (tract) infections: Secondary | ICD-10-CM | POA: Diagnosis not present

## 2019-04-10 DIAGNOSIS — M48061 Spinal stenosis, lumbar region without neurogenic claudication: Secondary | ICD-10-CM | POA: Diagnosis not present

## 2019-04-10 DIAGNOSIS — R300 Dysuria: Secondary | ICD-10-CM | POA: Diagnosis not present

## 2019-04-10 DIAGNOSIS — I482 Chronic atrial fibrillation, unspecified: Secondary | ICD-10-CM | POA: Diagnosis not present

## 2019-04-10 DIAGNOSIS — I495 Sick sinus syndrome: Secondary | ICD-10-CM | POA: Diagnosis not present

## 2019-04-10 DIAGNOSIS — Z9181 History of falling: Secondary | ICD-10-CM | POA: Diagnosis not present

## 2019-04-10 DIAGNOSIS — I209 Angina pectoris, unspecified: Secondary | ICD-10-CM | POA: Diagnosis not present

## 2019-04-10 DIAGNOSIS — Z960 Presence of urogenital implants: Secondary | ICD-10-CM | POA: Diagnosis not present

## 2019-04-10 DIAGNOSIS — Z87891 Personal history of nicotine dependence: Secondary | ICD-10-CM | POA: Diagnosis not present

## 2019-04-10 DIAGNOSIS — Z950 Presence of cardiac pacemaker: Secondary | ICD-10-CM | POA: Diagnosis not present

## 2019-04-10 DIAGNOSIS — Z7901 Long term (current) use of anticoagulants: Secondary | ICD-10-CM | POA: Diagnosis not present

## 2019-04-10 DIAGNOSIS — E782 Mixed hyperlipidemia: Secondary | ICD-10-CM | POA: Diagnosis not present

## 2019-04-10 DIAGNOSIS — I110 Hypertensive heart disease with heart failure: Secondary | ICD-10-CM | POA: Diagnosis not present

## 2019-04-10 NOTE — Telephone Encounter (Signed)
Home Health Verbal Orders - Caller/Agency: Merry Proud with [facility] Number: [phone] Requesting OT/PT/Skilled Nursing/Social Work/Speech Therapy: He needs an ok to receive orders form the patient cardiologist  Pt has heart failure and they just want to know if they can get orders because Dr. Caryn Section is patients primary

## 2019-04-10 NOTE — Telephone Encounter (Signed)
Please call to disuss getting patient into cardiac rehab in CHF clinic

## 2019-04-10 NOTE — Telephone Encounter (Signed)
Spoke with Merry Proud at [place]. He was requesting referral to CHF clinic and paramedicine program. They will be with patient through mid-January but then are concerned about home management for patient and his heart failure. He feels patient would be a good candidate for the paramedicine program to have person coming into the home on a regular basis. Patient has appointment this Friday with Dr. Garen Lah. I will route to him to make him aware of this request. At appointment on Friday, we can set up appointment with Darylene Price, NP in heart failure clinic who manages the paramedicine community program.

## 2019-04-11 ENCOUNTER — Other Ambulatory Visit: Payer: Self-pay

## 2019-04-11 ENCOUNTER — Telehealth: Payer: Self-pay

## 2019-04-11 ENCOUNTER — Other Ambulatory Visit
Admission: RE | Admit: 2019-04-11 | Discharge: 2019-04-11 | Disposition: A | Discharge status: Home / Self Care | Payer: Medicare Other | Source: Ambulatory Visit | Attending: Cardiology | Admitting: Cardiology

## 2019-04-11 DIAGNOSIS — I495 Sick sinus syndrome: Secondary | ICD-10-CM | POA: Diagnosis not present

## 2019-04-11 DIAGNOSIS — E782 Mixed hyperlipidemia: Secondary | ICD-10-CM | POA: Diagnosis not present

## 2019-04-11 DIAGNOSIS — M48061 Spinal stenosis, lumbar region without neurogenic claudication: Secondary | ICD-10-CM | POA: Diagnosis not present

## 2019-04-11 DIAGNOSIS — I110 Hypertensive heart disease with heart failure: Secondary | ICD-10-CM | POA: Diagnosis not present

## 2019-04-11 DIAGNOSIS — Z87891 Personal history of nicotine dependence: Secondary | ICD-10-CM | POA: Diagnosis not present

## 2019-04-11 DIAGNOSIS — I5022 Chronic systolic (congestive) heart failure: Secondary | ICD-10-CM | POA: Diagnosis not present

## 2019-04-11 DIAGNOSIS — I209 Angina pectoris, unspecified: Secondary | ICD-10-CM | POA: Diagnosis not present

## 2019-04-11 DIAGNOSIS — I480 Paroxysmal atrial fibrillation: Secondary | ICD-10-CM

## 2019-04-11 DIAGNOSIS — Z960 Presence of urogenital implants: Secondary | ICD-10-CM | POA: Diagnosis not present

## 2019-04-11 DIAGNOSIS — I482 Chronic atrial fibrillation, unspecified: Secondary | ICD-10-CM | POA: Diagnosis not present

## 2019-04-11 DIAGNOSIS — Z87440 Personal history of urinary (tract) infections: Secondary | ICD-10-CM | POA: Diagnosis not present

## 2019-04-11 DIAGNOSIS — Z9181 History of falling: Secondary | ICD-10-CM | POA: Diagnosis not present

## 2019-04-11 DIAGNOSIS — Z7901 Long term (current) use of anticoagulants: Secondary | ICD-10-CM | POA: Diagnosis not present

## 2019-04-11 DIAGNOSIS — R300 Dysuria: Secondary | ICD-10-CM | POA: Diagnosis not present

## 2019-04-11 DIAGNOSIS — M8008XD Age-related osteoporosis with current pathological fracture, vertebra(e), subsequent encounter for fracture with routine healing: Secondary | ICD-10-CM | POA: Diagnosis not present

## 2019-04-11 DIAGNOSIS — Z950 Presence of cardiac pacemaker: Secondary | ICD-10-CM | POA: Diagnosis not present

## 2019-04-11 DIAGNOSIS — M1990 Unspecified osteoarthritis, unspecified site: Secondary | ICD-10-CM | POA: Diagnosis not present

## 2019-04-11 DIAGNOSIS — I5023 Acute on chronic systolic (congestive) heart failure: Secondary | ICD-10-CM | POA: Diagnosis not present

## 2019-04-11 LAB — BASIC METABOLIC PANEL
Anion gap: 13 (ref 5–15)
BUN: 32 mg/dL — ABNORMAL HIGH (ref 8–23)
CO2: 28 mmol/L (ref 22–32)
Calcium: 9.5 mg/dL (ref 8.9–10.3)
Chloride: 95 mmol/L — ABNORMAL LOW (ref 98–111)
Creatinine, Ser: 0.89 mg/dL (ref 0.61–1.24)
GFR calc Af Amer: >60 mL/min (ref >60)
GFR calc non Af Amer: >60 mL/min (ref >60)
Glucose, Bld: 109 mg/dL — ABNORMAL HIGH (ref 70–99)
Potassium: 3.8 mmol/L (ref 3.5–5.1)
Sodium: 136 mmol/L (ref 135–145)

## 2019-04-11 NOTE — Telephone Encounter (Signed)
-----   Message from Kate Sable, MD sent at 04/11/2019  3:59 PM EST ----- Potassium and creatinine are normal.

## 2019-04-11 NOTE — Telephone Encounter (Signed)
Kate Sable, MD  You 15 minutes ago (3:49 PM)   Ok sounds good.   Routing comment

## 2019-04-11 NOTE — Telephone Encounter (Signed)
Patient made aware of lab results with verbalized understanding. 

## 2019-04-12 DIAGNOSIS — Z950 Presence of cardiac pacemaker: Secondary | ICD-10-CM | POA: Diagnosis not present

## 2019-04-12 DIAGNOSIS — R300 Dysuria: Secondary | ICD-10-CM | POA: Diagnosis not present

## 2019-04-12 DIAGNOSIS — Z87440 Personal history of urinary (tract) infections: Secondary | ICD-10-CM | POA: Diagnosis not present

## 2019-04-12 DIAGNOSIS — I495 Sick sinus syndrome: Secondary | ICD-10-CM | POA: Diagnosis not present

## 2019-04-12 DIAGNOSIS — Z960 Presence of urogenital implants: Secondary | ICD-10-CM | POA: Diagnosis not present

## 2019-04-12 DIAGNOSIS — M8008XD Age-related osteoporosis with current pathological fracture, vertebra(e), subsequent encounter for fracture with routine healing: Secondary | ICD-10-CM | POA: Diagnosis not present

## 2019-04-12 DIAGNOSIS — I209 Angina pectoris, unspecified: Secondary | ICD-10-CM | POA: Diagnosis not present

## 2019-04-12 DIAGNOSIS — I5023 Acute on chronic systolic (congestive) heart failure: Secondary | ICD-10-CM | POA: Diagnosis not present

## 2019-04-12 DIAGNOSIS — Z7901 Long term (current) use of anticoagulants: Secondary | ICD-10-CM | POA: Diagnosis not present

## 2019-04-12 DIAGNOSIS — M1990 Unspecified osteoarthritis, unspecified site: Secondary | ICD-10-CM | POA: Diagnosis not present

## 2019-04-12 DIAGNOSIS — E782 Mixed hyperlipidemia: Secondary | ICD-10-CM | POA: Diagnosis not present

## 2019-04-12 DIAGNOSIS — I482 Chronic atrial fibrillation, unspecified: Secondary | ICD-10-CM | POA: Diagnosis not present

## 2019-04-12 DIAGNOSIS — I110 Hypertensive heart disease with heart failure: Secondary | ICD-10-CM | POA: Diagnosis not present

## 2019-04-12 DIAGNOSIS — Z87891 Personal history of nicotine dependence: Secondary | ICD-10-CM | POA: Diagnosis not present

## 2019-04-12 DIAGNOSIS — Z9181 History of falling: Secondary | ICD-10-CM | POA: Diagnosis not present

## 2019-04-12 DIAGNOSIS — M48061 Spinal stenosis, lumbar region without neurogenic claudication: Secondary | ICD-10-CM | POA: Diagnosis not present

## 2019-04-13 ENCOUNTER — Other Ambulatory Visit: Payer: Self-pay

## 2019-04-13 ENCOUNTER — Encounter: Payer: Self-pay | Admitting: Cardiology

## 2019-04-13 ENCOUNTER — Ambulatory Visit (INDEPENDENT_AMBULATORY_CARE_PROVIDER_SITE_OTHER): Payer: Medicare Other | Admitting: Cardiology

## 2019-04-13 VITALS — BP 110/64 | HR 84 | Ht 66.0 in | Wt 186.0 lb

## 2019-04-13 DIAGNOSIS — I480 Paroxysmal atrial fibrillation: Secondary | ICD-10-CM | POA: Diagnosis not present

## 2019-04-13 DIAGNOSIS — I5022 Chronic systolic (congestive) heart failure: Secondary | ICD-10-CM

## 2019-04-13 DIAGNOSIS — I495 Sick sinus syndrome: Secondary | ICD-10-CM

## 2019-04-13 MED ORDER — TORSEMIDE 20 MG PO TABS: ORAL_TABLET | ORAL | 3 refills | Status: DC

## 2019-04-13 NOTE — Progress Notes (Addendum)
Cardiology Office Note:    Date:  04/13/2019   ID:  Douglas Calhoun, DOB Oct 21, 1919, MRN [id number]  PCP:  Birdie Sons, MD  Cardiologist:  Kate Sable, MD  Electrophysiologist:  None   Referring MD: Birdie Sons, MD   Chief Complaint  Patient presents with  . office visit    1 week F/U; Meds verbally reviewed with patient.    History of Present Illness:    Douglas Calhoun is a 85 y.o. male with a hx of atrial fibrillation on Eliquis, sick sinus syndrome status post leadless pacemaker 01/11/2019,  heart failure reduced ejection fraction, last EF 25% on 02/05/2019, who presents for follow-up.  He was originally seen to establish care and also due to shortness of breath, lower extremity edema and 10 pound weight gain.  Patient was diagnosed with lumbar compression fracture in November 2020.  He was admitted at [facility].  Surgery was not performed due to his age and medical management recommended.  He was noted to be fluid overloaded at the time and was adequately diuresed.  On discharge, his weight was 180 pounds.    At last visit, his Lasix dose was increased to 80 mg twice daily.  BMP after 5 days of increased dose diuresing revealed normal creatinine and normal potassium.  He lost an average of 6 pounds.  He originally lost 10 pounds but started retaining some fluid.   He has urinary obstruction and self caths themselves.  He denies any history of heart attacks or CAD.  Last echocardiogram date 02/05/2019 revealed severely reduced ejection fraction with EF 25%.  Moderately dilated left ventricle, moderately dilated right ventricle.  Past Medical History:  Diagnosis Date  . Achilles tendinitis   . Arrhythmia    atrial fibrillation  . Cataract   . CHF (congestive heart failure) ([place])   . Hearing loss   . Herpes zoster without complication [id number]  . Personal history of prostate cancer 10/08/2014   s/p 15 radiation treatments treated by Dr. Madelin Headings   . Varicose vein of  leg     Past Surgical History:  Procedure Laterality Date  . APPENDECTOMY  1960's  . BREAST SURGERY     Breast Biopsy prior to 1960  . CATARACT EXTRACTION Left   . PACEMAKER LEADLESS INSERTION N/A 01/11/2019   Procedure: PACEMAKER LEADLESS INSERTION;  Surgeon: Isaias Cowman, MD;  Location: [place] [facility];  Service: Cardiovascular;  Laterality: N/A;    Current Medications: Current Meds  Medication Sig  . acetaminophen (TYLENOL) 325 MG tablet Take 650 mg by mouth 3 (three) times daily.  Marland Kitchen alendronate (FOSAMAX) 70 MG tablet Take 70 mg by mouth once a week. Take with a full glass of water on an empty stomach on Saturdays.  Marland Kitchen apixaban (ELIQUIS) 5 MG TABS tablet Take 5 mg by mouth 2 (two) times daily.  . Calcium Carbonate-Vitamin D (CALCIUM 500 + D PO) Take 1 tablet by mouth daily.  . cholecalciferol (VITAMIN D3) 25 MCG (1000 UT) tablet Take 2,000 Units by mouth daily.   . ferrous sulfate 325 (65 FE) MG tablet Take 1 tablet by mouth daily.  Marland Kitchen gabapentin (NEURONTIN) 100 MG capsule TAKE 1 CAPSULE IN THE MORNING AND TWO AT NIGHT  . Melatonin 3 MG TABS Take 1 capsule by mouth at bedtime.  . Multiple Vitamins-Minerals (MULTIVITAMIN ADULT PO) Take 1 tablet by mouth daily.  . Polyethylene Glycol 3350 (MIRALAX PO) Take 1 Dose by mouth daily. Heaping teaspoon  . potassium  chloride (K-DUR) 10 MEQ tablet TAKE 1 TABLET BY MOUTH DAILY (Patient taking differently: Take 10 mEq by mouth daily. )  . Probiotic CAPS Take 1 capsule by mouth daily.  . Psyllium (METAMUCIL PO) Take 1 Dose by mouth daily.  Marland Kitchen pyridOXINE (VITAMIN B-6) 100 MG tablet Take 100 mg by mouth daily.  . tamsulosin (FLOMAX) 0.4 MG CAPS capsule Take 0.4 mg by mouth 2 (two) times daily.   . Vitamin E 400 units TABS Take 1 tablet by mouth daily.  . [DISCONTINUED] furosemide (LASIX) 40 MG tablet Take 2 tablets (80 mg total) by mouth 2 (two) times daily.     Allergies:   Patient has no known allergies.   Social History    Socioeconomic History  . Marital status: Married    Spouse name: Not on file  . Number of children: Not on file  . Years of education: Not on file  . Highest education level: Not on file  Occupational History  . Occupation: retired  Tobacco Use  . Smoking status: Never Smoker  . Smokeless tobacco: Never Used  Substance and Sexual Activity  . Alcohol use: Yes    Alcohol/week: 0.0 standard drinks    Comment: drinks 2 beers a month  . Drug use: No  . Sexual activity: Not on file  Other Topics Concern  . Not on file  Social History Narrative   Lives along at [place] Strain:   . Difficulty of Paying Living Expenses: Not on file  Food Insecurity:   . Worried About Charity fundraiser in the Last Year: Not on file  . Ran Out of Food in the Last Year: Not on file  Transportation Needs:   . Lack of Transportation (Medical): Not on file  . Lack of Transportation (Non-Medical): Not on file  Physical Activity:   . Days of Exercise per Week: Not on file  . Minutes of Exercise per Session: Not on file  Stress:   . Feeling of Stress : Not on file  Social Connections:   . Frequency of Communication with Friends and Family: Not on file  . Frequency of Social Gatherings with Friends and Family: Not on file  . Attends Religious Services: Not on file  . Active Member of Clubs or Organizations: Not on file  . Attends Archivist Meetings: Not on file  . Marital Status: Not on file     Family History: The patient's family history includes Heart disease in his mother; Stroke in his mother.  ROS:   Please see the history of present illness.     All other systems reviewed and are negative.  EKGs/Labs/Other Studies Reviewed:    The following studies were reviewed today: TTEchocardiogram date 02/05/2019 revealed severely reduced ejection fraction with EF 25%.   Moderately dilated left ventricle,  moderately dilated right  ventricle. Moderate MR Moderate TR  EKG:  EKG is  ordered today.  The ekg ordered today demonstrates V paced rhythm, occasional PVCs.  Recent Labs: 03/29/2019: Hemoglobin 13.4; Magnesium 2.4; Platelets 247 04/11/2019: BUN 32; Creatinine, Ser 0.89; Potassium 3.8; Sodium 136  Recent Lipid Panel    Component Value Date/Time   CHOL 231 (A) 03/06/2013 0000   TRIG 98 03/06/2013 0000   HDL 78 (A) 03/06/2013 0000   LDLCALC 133 03/06/2013 0000    Physical Exam:    VS:  BP 110/64 (BP Location: Left Arm, Patient Position: Sitting, Cuff Size: Normal)  Pulse 84   Ht 5\' 6"  (1.676 m)   Wt 186 lb (84.4 kg)   SpO2 95%   BMI 30.02 kg/m     Wt Readings from Last 3 Encounters:  04/13/19 186 lb (84.4 kg)  04/06/19 192 lb 6 oz (87.3 kg)  04/02/19 193 lb (87.5 kg)     GEN:  Well nourished, well developed in mild distress due to back pain, frail looking HEENT: Normal NECK: No JVD; No carotid bruits LYMPHATICS: No lymphadenopathy CARDIAC: Irregular irregular, no murmurs, rubs, gallops RESPIRATORY:  Clear to auscultation without rales, wheezing or rhonchi  ABDOMEN: Soft, non-tender, non-distended MUSCULOSKELETAL:  2+ edema; No deformity  SKIN: Warm and dry NEUROLOGIC:  Alert and oriented x 3 PSYCHIATRIC:  Normal affect   ASSESSMENT:   Severely reduced EF, EF 25%.  Lower extremity edema on physical exam.  Patient lost an average of 6 pounds.  It is possible the Lasix is not getting absorbed due to abdominal edema.  Had a long discussion with patient regarding work-up for low ejection fraction including left heart cath, coronary CTA, stress test.  If an abnormality is found all the management options.  Due to his back pain secondary to compression fracture, he is able to lay flat on the cath table if a stent is to be placed.  For now we will manage patient medically.  1. Chronic systolic heart failure (HCC)   2. Paroxysmal atrial fibrillation ([place])   3. Sick sinus syndrome (HCC)    PLAN:      In order of problems listed above:  1. Stop Lasix, start torsemide 40 mg twice daily.  Check BMP in 1 week.  Monitor daily weights.  Patient's dry weight is around 180 pounds.  .  If patient is not adequately diuresed, we will plan on adding metolazone an 30 minutes to an hour before loop diuretic.  We will have patient set up with CHF clinic.  Holding off on heart failure medications in light of aggressive diuresing.  We will plan to start CHF meds if blood pressure tolerates after adequate diuresing. 2. Heart rate controlled.  Continue Eliquis. 3. Sick sinus syndrome status post leadless pacemaker.  Keep appointment with EP/device clinic for frequent checks.  Follow-up in 1-2 weeks  This note was generated in part or whole with voice recognition software. Voice recognition is usually quite accurate but there are transcription errors that can and very often do occur. I apologize for any typographical errors that were not detected and corrected.  Medication Adjustments/Labs and Tests Ordered: Current medicines are reviewed at length with the patient today.  Concerns regarding medicines are outlined above.  Orders Placed This Encounter  Procedures  . Basic metabolic panel  . EKG 12-Lead   Meds ordered this encounter  Medications  . torsemide (DEMADEX) 20 MG tablet    Sig: Take 2 tablets (40 mg) by mouth twice a day    Dispense:  120 tablet    Refill:  3    Stopping lasix    Patient Instructions  Medication Instructions:  - Your physician has recommended you make the following change in your medication:   1) Stop lasix (furosemide)  2) Start demadex (torsemide) 20 mg- take 2 tablets (40 mg) by mouth twice a day  *If you need a refill on your cardiac medications before your next appointment, please call your pharmacy*  Lab Work: - Your physician recommends that you return for lab work in: 1 week- [facility]  Entrance, 1st desk on the right to check in Monday- Friday  (7:30 am- 5:30 pm) You do not have to fast  If you have labs (blood work) drawn today and your tests are completely normal, you will receive your results only by: Marland Kitchen MyChart Message (if you have MyChart) OR . A paper copy in the mail If you have any lab test that is abnormal or we need to change your treatment, we will call you to review the results.  Testing/Procedures: - none ordered  Follow-Up: At [facility], you and your health needs are our priority.  As part of our continuing mission to provide you with exceptional heart care, we have created designated Provider Care Teams.  These Care Teams include your primary Cardiologist (physician) and Advanced Practice Providers (APPs -  Physician Assistants and Nurse Practitioners) who all work together to provide you with the care you need, when you need it.  Your next appointment:   10 day(s)  The format for your next appointment:   In Person  Provider:   Kate Sable, MD  Other Instructions - You have been referred to : Darylene Price, NP in the [facility]  She will help establish you with the Paramedicine Program       Signed, Kate Sable, MD  04/13/2019 4:17 PM    [place]

## 2019-04-13 NOTE — Patient Instructions (Addendum)
Medication Instructions:  - Your physician has recommended you make the following change in your medication:   1) Stop lasix (furosemide)  2) Start demadex (torsemide) 20 mg- take 2 tablets (40 mg) by mouth twice a day  *If you need a refill on your cardiac medications before your next appointment, please call your pharmacy*  Lab Work: - Your physician recommends that you return for lab work in: 1 week- [employer], 1st desk on the right to check in Monday- Friday (7:30 am- 5:30 pm) You do not have to fast  If you have labs (blood work) drawn today and your tests are completely normal, you will receive your results only by: Marland Kitchen MyChart Message (if you have MyChart) OR . A paper copy in the mail If you have any lab test that is abnormal or we need to change your treatment, we will call you to review the results.  Testing/Procedures: - none ordered  Follow-Up: At [facility], you and your health needs are our priority.  As part of our continuing mission to provide you with exceptional heart care, we have created designated Provider Care Teams.  These Care Teams include your primary Cardiologist (physician) and Advanced Practice Providers (APPs -  Physician Assistants and Nurse Practitioners) who all work together to provide you with the care you need, when you need it.  Your next appointment:   10 day(s)  The format for your next appointment:   In Person  Provider:   Kate Sable, MD  Other Instructions - You have been referred to : Darylene Price, NP in the [facility]  She will help establish you with the Paramedicine Program

## 2019-04-15 DIAGNOSIS — Z9181 History of falling: Secondary | ICD-10-CM | POA: Diagnosis not present

## 2019-04-15 DIAGNOSIS — I482 Chronic atrial fibrillation, unspecified: Secondary | ICD-10-CM | POA: Diagnosis not present

## 2019-04-15 DIAGNOSIS — Z960 Presence of urogenital implants: Secondary | ICD-10-CM | POA: Diagnosis not present

## 2019-04-15 DIAGNOSIS — M1990 Unspecified osteoarthritis, unspecified site: Secondary | ICD-10-CM | POA: Diagnosis not present

## 2019-04-15 DIAGNOSIS — E782 Mixed hyperlipidemia: Secondary | ICD-10-CM | POA: Diagnosis not present

## 2019-04-15 DIAGNOSIS — M8008XD Age-related osteoporosis with current pathological fracture, vertebra(e), subsequent encounter for fracture with routine healing: Secondary | ICD-10-CM | POA: Diagnosis not present

## 2019-04-15 DIAGNOSIS — I495 Sick sinus syndrome: Secondary | ICD-10-CM | POA: Diagnosis not present

## 2019-04-15 DIAGNOSIS — M48061 Spinal stenosis, lumbar region without neurogenic claudication: Secondary | ICD-10-CM | POA: Diagnosis not present

## 2019-04-15 DIAGNOSIS — Z87440 Personal history of urinary (tract) infections: Secondary | ICD-10-CM | POA: Diagnosis not present

## 2019-04-15 DIAGNOSIS — R300 Dysuria: Secondary | ICD-10-CM | POA: Diagnosis not present

## 2019-04-15 DIAGNOSIS — I209 Angina pectoris, unspecified: Secondary | ICD-10-CM | POA: Diagnosis not present

## 2019-04-15 DIAGNOSIS — I110 Hypertensive heart disease with heart failure: Secondary | ICD-10-CM | POA: Diagnosis not present

## 2019-04-15 DIAGNOSIS — Z87891 Personal history of nicotine dependence: Secondary | ICD-10-CM | POA: Diagnosis not present

## 2019-04-15 DIAGNOSIS — Z7901 Long term (current) use of anticoagulants: Secondary | ICD-10-CM | POA: Diagnosis not present

## 2019-04-15 DIAGNOSIS — I5023 Acute on chronic systolic (congestive) heart failure: Secondary | ICD-10-CM | POA: Diagnosis not present

## 2019-04-15 DIAGNOSIS — Z950 Presence of cardiac pacemaker: Secondary | ICD-10-CM | POA: Diagnosis not present

## 2019-04-16 DIAGNOSIS — I482 Chronic atrial fibrillation, unspecified: Secondary | ICD-10-CM | POA: Diagnosis not present

## 2019-04-16 DIAGNOSIS — Z9181 History of falling: Secondary | ICD-10-CM | POA: Diagnosis not present

## 2019-04-16 DIAGNOSIS — Z7901 Long term (current) use of anticoagulants: Secondary | ICD-10-CM | POA: Diagnosis not present

## 2019-04-16 DIAGNOSIS — I110 Hypertensive heart disease with heart failure: Secondary | ICD-10-CM | POA: Diagnosis not present

## 2019-04-16 DIAGNOSIS — I209 Angina pectoris, unspecified: Secondary | ICD-10-CM | POA: Diagnosis not present

## 2019-04-16 DIAGNOSIS — Z960 Presence of urogenital implants: Secondary | ICD-10-CM | POA: Diagnosis not present

## 2019-04-16 DIAGNOSIS — Z950 Presence of cardiac pacemaker: Secondary | ICD-10-CM | POA: Diagnosis not present

## 2019-04-16 DIAGNOSIS — E782 Mixed hyperlipidemia: Secondary | ICD-10-CM | POA: Diagnosis not present

## 2019-04-16 DIAGNOSIS — Z87891 Personal history of nicotine dependence: Secondary | ICD-10-CM | POA: Diagnosis not present

## 2019-04-16 DIAGNOSIS — R300 Dysuria: Secondary | ICD-10-CM | POA: Diagnosis not present

## 2019-04-16 DIAGNOSIS — M1990 Unspecified osteoarthritis, unspecified site: Secondary | ICD-10-CM | POA: Diagnosis not present

## 2019-04-16 DIAGNOSIS — Z87440 Personal history of urinary (tract) infections: Secondary | ICD-10-CM | POA: Diagnosis not present

## 2019-04-16 DIAGNOSIS — I495 Sick sinus syndrome: Secondary | ICD-10-CM | POA: Diagnosis not present

## 2019-04-16 DIAGNOSIS — M8008XD Age-related osteoporosis with current pathological fracture, vertebra(e), subsequent encounter for fracture with routine healing: Secondary | ICD-10-CM | POA: Diagnosis not present

## 2019-04-16 DIAGNOSIS — I5023 Acute on chronic systolic (congestive) heart failure: Secondary | ICD-10-CM | POA: Diagnosis not present

## 2019-04-16 DIAGNOSIS — M48061 Spinal stenosis, lumbar region without neurogenic claudication: Secondary | ICD-10-CM | POA: Diagnosis not present

## 2019-04-17 ENCOUNTER — Telehealth (HOSPITAL_COMMUNITY): Payer: Self-pay

## 2019-04-17 DIAGNOSIS — M8008XD Age-related osteoporosis with current pathological fracture, vertebra(e), subsequent encounter for fracture with routine healing: Secondary | ICD-10-CM | POA: Diagnosis not present

## 2019-04-17 DIAGNOSIS — Z7901 Long term (current) use of anticoagulants: Secondary | ICD-10-CM | POA: Diagnosis not present

## 2019-04-17 DIAGNOSIS — M1990 Unspecified osteoarthritis, unspecified site: Secondary | ICD-10-CM | POA: Diagnosis not present

## 2019-04-17 DIAGNOSIS — M48061 Spinal stenosis, lumbar region without neurogenic claudication: Secondary | ICD-10-CM | POA: Diagnosis not present

## 2019-04-17 DIAGNOSIS — I110 Hypertensive heart disease with heart failure: Secondary | ICD-10-CM | POA: Diagnosis not present

## 2019-04-17 DIAGNOSIS — I495 Sick sinus syndrome: Secondary | ICD-10-CM | POA: Diagnosis not present

## 2019-04-17 DIAGNOSIS — Z9181 History of falling: Secondary | ICD-10-CM | POA: Diagnosis not present

## 2019-04-17 DIAGNOSIS — I482 Chronic atrial fibrillation, unspecified: Secondary | ICD-10-CM | POA: Diagnosis not present

## 2019-04-17 DIAGNOSIS — Z950 Presence of cardiac pacemaker: Secondary | ICD-10-CM | POA: Diagnosis not present

## 2019-04-17 DIAGNOSIS — E782 Mixed hyperlipidemia: Secondary | ICD-10-CM | POA: Diagnosis not present

## 2019-04-17 DIAGNOSIS — Z960 Presence of urogenital implants: Secondary | ICD-10-CM | POA: Diagnosis not present

## 2019-04-17 DIAGNOSIS — R300 Dysuria: Secondary | ICD-10-CM | POA: Diagnosis not present

## 2019-04-17 DIAGNOSIS — I209 Angina pectoris, unspecified: Secondary | ICD-10-CM | POA: Diagnosis not present

## 2019-04-17 DIAGNOSIS — Z87891 Personal history of nicotine dependence: Secondary | ICD-10-CM | POA: Diagnosis not present

## 2019-04-17 DIAGNOSIS — Z87440 Personal history of urinary (tract) infections: Secondary | ICD-10-CM | POA: Diagnosis not present

## 2019-04-17 DIAGNOSIS — I5023 Acute on chronic systolic (congestive) heart failure: Secondary | ICD-10-CM | POA: Diagnosis not present

## 2019-04-17 NOTE — Telephone Encounter (Signed)
That's fine

## 2019-04-17 NOTE — Telephone Encounter (Signed)
Today contacted Douglas Calhoun and talked a bit with him.  He lives behind [facility] in their independently living facility.   We discussed his medications and asked if he was taking the torsemide now that his cardiology put him on.  He is unsure of what medications he takes, there is a Marine scientist from [facility] that fixes his medications for him.  Set up a home visit with him next week and will look through his medications to make sure he is taking them right.  He was a little hesitant about visits and he states phone calls are good but he would rather for visits to be once a month.  Will discuss the program further with during the home visit.  He states his appetite is not much, he eats some and substitutes with protein shakes.  He could not tell me how much fluid he is drinking a day, will try to figure it out during home vstates he has everything he needs in his home.  He states feels well today, he states swelling is down.  He weighs daily and he states his weight this morning was 189 lbs.  Will keep a trend of his weight, he states has not gained any in past week, he has lost.  He states always short of breath some with activity and he denies getting more short of breath than his normal.  Will visit next week at his home and find out more about him.  He also is aware of cardiology appt next week.  Will visit for Heart Failure.   [place] [phone]

## 2019-04-17 NOTE — Telephone Encounter (Signed)
From [facility] 

## 2019-04-17 NOTE — Telephone Encounter (Signed)
Verbal orders given to [name] with [facility] at [facility].

## 2019-04-17 NOTE — Telephone Encounter (Signed)
Please advise 

## 2019-04-19 ENCOUNTER — Other Ambulatory Visit
Admission: RE | Admit: 2019-04-19 | Discharge: 2019-04-19 | Disposition: A | Discharge status: Home / Self Care | Payer: Medicare Other | Source: Ambulatory Visit | Attending: Cardiology | Admitting: Cardiology

## 2019-04-19 DIAGNOSIS — I209 Angina pectoris, unspecified: Secondary | ICD-10-CM | POA: Diagnosis not present

## 2019-04-19 DIAGNOSIS — Z950 Presence of cardiac pacemaker: Secondary | ICD-10-CM | POA: Diagnosis not present

## 2019-04-19 DIAGNOSIS — Z960 Presence of urogenital implants: Secondary | ICD-10-CM | POA: Diagnosis not present

## 2019-04-19 DIAGNOSIS — M48061 Spinal stenosis, lumbar region without neurogenic claudication: Secondary | ICD-10-CM | POA: Diagnosis not present

## 2019-04-19 DIAGNOSIS — Z87891 Personal history of nicotine dependence: Secondary | ICD-10-CM | POA: Diagnosis not present

## 2019-04-19 DIAGNOSIS — I5022 Chronic systolic (congestive) heart failure: Secondary | ICD-10-CM | POA: Diagnosis not present

## 2019-04-19 DIAGNOSIS — Z7901 Long term (current) use of anticoagulants: Secondary | ICD-10-CM | POA: Diagnosis not present

## 2019-04-19 DIAGNOSIS — I495 Sick sinus syndrome: Secondary | ICD-10-CM | POA: Diagnosis not present

## 2019-04-19 DIAGNOSIS — I480 Paroxysmal atrial fibrillation: Secondary | ICD-10-CM | POA: Insufficient documentation

## 2019-04-19 DIAGNOSIS — I110 Hypertensive heart disease with heart failure: Secondary | ICD-10-CM | POA: Diagnosis not present

## 2019-04-19 DIAGNOSIS — Z87440 Personal history of urinary (tract) infections: Secondary | ICD-10-CM | POA: Diagnosis not present

## 2019-04-19 DIAGNOSIS — Z9181 History of falling: Secondary | ICD-10-CM | POA: Diagnosis not present

## 2019-04-19 DIAGNOSIS — I482 Chronic atrial fibrillation, unspecified: Secondary | ICD-10-CM | POA: Diagnosis not present

## 2019-04-19 DIAGNOSIS — M8008XD Age-related osteoporosis with current pathological fracture, vertebra(e), subsequent encounter for fracture with routine healing: Secondary | ICD-10-CM | POA: Diagnosis not present

## 2019-04-19 DIAGNOSIS — E782 Mixed hyperlipidemia: Secondary | ICD-10-CM | POA: Diagnosis not present

## 2019-04-19 DIAGNOSIS — R300 Dysuria: Secondary | ICD-10-CM | POA: Diagnosis not present

## 2019-04-19 DIAGNOSIS — I5023 Acute on chronic systolic (congestive) heart failure: Secondary | ICD-10-CM | POA: Diagnosis not present

## 2019-04-19 DIAGNOSIS — M1990 Unspecified osteoarthritis, unspecified site: Secondary | ICD-10-CM | POA: Diagnosis not present

## 2019-04-19 LAB — BASIC METABOLIC PANEL
Anion gap: 11 (ref 5–15)
BUN: 40 mg/dL — ABNORMAL HIGH (ref 8–23)
CO2: 31 mmol/L (ref 22–32)
Calcium: 9.4 mg/dL (ref 8.9–10.3)
Chloride: 94 mmol/L — ABNORMAL LOW (ref 98–111)
Creatinine, Ser: 1.05 mg/dL (ref 0.61–1.24)
GFR calc Af Amer: >60 mL/min (ref >60)
GFR calc non Af Amer: 59 mL/min — ABNORMAL LOW (ref >60)
Glucose, Bld: 114 mg/dL — ABNORMAL HIGH (ref 70–99)
Potassium: 3.5 mmol/L (ref 3.5–5.1)
Sodium: 136 mmol/L (ref 135–145)

## 2019-04-24 ENCOUNTER — Ambulatory Visit (INDEPENDENT_AMBULATORY_CARE_PROVIDER_SITE_OTHER): Payer: Medicare Other | Admitting: Cardiology

## 2019-04-24 ENCOUNTER — Encounter: Payer: Self-pay | Admitting: Cardiology

## 2019-04-24 VITALS — BP 70/50 | HR 86 | Ht 66.0 in | Wt 183.6 lb

## 2019-04-24 DIAGNOSIS — Z7901 Long term (current) use of anticoagulants: Secondary | ICD-10-CM | POA: Diagnosis not present

## 2019-04-24 DIAGNOSIS — E782 Mixed hyperlipidemia: Secondary | ICD-10-CM | POA: Diagnosis not present

## 2019-04-24 DIAGNOSIS — Z9181 History of falling: Secondary | ICD-10-CM | POA: Diagnosis not present

## 2019-04-24 DIAGNOSIS — I480 Paroxysmal atrial fibrillation: Secondary | ICD-10-CM

## 2019-04-24 DIAGNOSIS — Z79899 Other long term (current) drug therapy: Secondary | ICD-10-CM | POA: Diagnosis not present

## 2019-04-24 DIAGNOSIS — I482 Chronic atrial fibrillation, unspecified: Secondary | ICD-10-CM | POA: Diagnosis not present

## 2019-04-24 DIAGNOSIS — Z960 Presence of urogenital implants: Secondary | ICD-10-CM | POA: Diagnosis not present

## 2019-04-24 DIAGNOSIS — I5022 Chronic systolic (congestive) heart failure: Secondary | ICD-10-CM | POA: Diagnosis not present

## 2019-04-24 DIAGNOSIS — Z950 Presence of cardiac pacemaker: Secondary | ICD-10-CM | POA: Diagnosis not present

## 2019-04-24 DIAGNOSIS — Z87440 Personal history of urinary (tract) infections: Secondary | ICD-10-CM | POA: Diagnosis not present

## 2019-04-24 DIAGNOSIS — I5023 Acute on chronic systolic (congestive) heart failure: Secondary | ICD-10-CM | POA: Diagnosis not present

## 2019-04-24 DIAGNOSIS — Z87891 Personal history of nicotine dependence: Secondary | ICD-10-CM | POA: Diagnosis not present

## 2019-04-24 DIAGNOSIS — M48061 Spinal stenosis, lumbar region without neurogenic claudication: Secondary | ICD-10-CM | POA: Diagnosis not present

## 2019-04-24 DIAGNOSIS — I209 Angina pectoris, unspecified: Secondary | ICD-10-CM | POA: Diagnosis not present

## 2019-04-24 DIAGNOSIS — I110 Hypertensive heart disease with heart failure: Secondary | ICD-10-CM | POA: Diagnosis not present

## 2019-04-24 DIAGNOSIS — M1990 Unspecified osteoarthritis, unspecified site: Secondary | ICD-10-CM | POA: Diagnosis not present

## 2019-04-24 DIAGNOSIS — R300 Dysuria: Secondary | ICD-10-CM | POA: Diagnosis not present

## 2019-04-24 DIAGNOSIS — M8008XD Age-related osteoporosis with current pathological fracture, vertebra(e), subsequent encounter for fracture with routine healing: Secondary | ICD-10-CM | POA: Diagnosis not present

## 2019-04-24 DIAGNOSIS — I495 Sick sinus syndrome: Secondary | ICD-10-CM | POA: Diagnosis not present

## 2019-04-24 MED ORDER — TORSEMIDE 20 MG PO TABS: 40.0000 mg | ORAL_TABLET | Freq: Every day | ORAL | 3 refills | Status: DC

## 2019-04-24 NOTE — Progress Notes (Signed)
Cardiology Office Note:    Date:  04/24/2019   ID:  Douglas Calhoun, DOB 1919/09/08, MRN [id number]  PCP:  Birdie Sons, MD  Cardiologist:  Kate Sable, MD  Electrophysiologist:  None   Referring MD: Birdie Sons, MD   Chief Complaint  Patient presents with  . OTHER    10 day F/u CHF/afib low BP. Meds reviewed verbally with pt.    History of Present Illness:    Douglas Calhoun is a 85 y.o. male with a hx of atrial fibrillation on Eliquis, sick sinus syndrome status post leadless pacemaker 01/11/2019,  heart failure reduced ejection fraction, last EF 25% on 02/05/2019, who presents for follow-up.  He was initially seen to establish care and also due to shortness of breath, lower extremity edema and 10 pound weight gain.  Patient was diagnosed with lumbar compression fracture in November 2020.  He was admitted at [facility].  Surgery was not performed due to his age and medical management recommended.  He was noted to be fluid overloaded at the time and was adequately diuresed.  On discharge, his weight was 180 pounds.     He has urinary obstruction and self caths themselves.  He denies any history of heart attacks or CAD.  Last echocardiogram date 02/05/2019 revealed severely reduced ejection fraction with EF 25%.  Moderately dilated left ventricle, moderately dilated right ventricle.  He was originally put on Lasix with about 6 pound weight loss.   At last visit, Lasix was switched to torsemide 40 twice daily.  He states edema has improved.  Past Medical History:  Diagnosis Date  . Achilles tendinitis   . Arrhythmia    atrial fibrillation  . Cataract   . CHF (congestive heart failure) ([place])   . Hearing loss   . Herpes zoster without complication [id number]  . Personal history of prostate cancer 10/08/2014   s/p 15 radiation treatments treated by Dr. Madelin Headings   . Varicose vein of leg     Past Surgical History:  Procedure Laterality Date  . APPENDECTOMY  1960's  . BREAST  SURGERY     Breast Biopsy prior to 1960  . CATARACT EXTRACTION Left   . PACEMAKER LEADLESS INSERTION N/A 01/11/2019   Procedure: PACEMAKER LEADLESS INSERTION;  Surgeon: Isaias Cowman, MD;  Location: [facility];  Service: Cardiovascular;  Laterality: N/A;    Current Medications: Current Meds  Medication Sig  . acetaminophen (TYLENOL) 325 MG tablet Take 650 mg by mouth 3 (three) times daily.  Marland Kitchen alendronate (FOSAMAX) 70 MG tablet Take 70 mg by mouth once a week. Take with a full glass of water on an empty stomach on Saturdays.  Marland Kitchen apixaban (ELIQUIS) 5 MG TABS tablet Take 5 mg by mouth 2 (two) times daily.  . Calcium Carbonate-Vitamin D (CALCIUM 500 + D PO) Take 1 tablet by mouth daily.  . cholecalciferol (VITAMIN D3) 25 MCG (1000 UT) tablet Take 2,000 Units by mouth daily.   Marland Kitchen gabapentin (NEURONTIN) 100 MG capsule TAKE 1 CAPSULE IN THE MORNING AND TWO AT NIGHT  . Multiple Vitamins-Minerals (MULTIVITAMIN ADULT PO) Take 1 tablet by mouth daily.  . Polyethylene Glycol 3350 (MIRALAX PO) Take 1 Dose by mouth daily. Heaping teaspoon  . potassium chloride (K-DUR) 10 MEQ tablet TAKE 1 TABLET BY MOUTH DAILY (Patient taking differently: Take 10 mEq by mouth daily. )  . Probiotic CAPS Take 1 capsule by mouth daily.  . Psyllium (METAMUCIL PO) Take 1 Dose by mouth daily.  Marland Kitchen  pyridOXINE (VITAMIN B-6) 100 MG tablet Take 100 mg by mouth daily.  . tamsulosin (FLOMAX) 0.4 MG CAPS capsule Take 0.4 mg by mouth 2 (two) times daily.   Marland Kitchen torsemide (DEMADEX) 20 MG tablet Take 2 tablets (40 mg total) by mouth daily.  . Vitamin E 400 units TABS Take 1 tablet by mouth daily.  . [DISCONTINUED] torsemide (DEMADEX) 20 MG tablet Take 2 tablets (40 mg) by mouth twice a day     Allergies:   Patient has no known allergies.   Social History   Socioeconomic History  . Marital status: Married    Spouse name: Not on file  . Number of children: Not on file  . Years of education: Not on file  . Highest  education level: Not on file  Occupational History  . Occupation: retired  Tobacco Use  . Smoking status: Never Smoker  . Smokeless tobacco: Never Used  Substance and Sexual Activity  . Alcohol use: Yes    Alcohol/week: 0.0 standard drinks    Comment: drinks 2 beers a month  . Drug use: No  . Sexual activity: Not on file  Other Topics Concern  . Not on file  Social History Narrative   Lives along at [place] Strain:   . Difficulty of Paying Living Expenses: Not on file  Food Insecurity:   . Worried About Charity fundraiser in the Last Year: Not on file  . Ran Out of Food in the Last Year: Not on file  Transportation Needs:   . Lack of Transportation (Medical): Not on file  . Lack of Transportation (Non-Medical): Not on file  Physical Activity:   . Days of Exercise per Week: Not on file  . Minutes of Exercise per Session: Not on file  Stress:   . Feeling of Stress : Not on file  Social Connections:   . Frequency of Communication with Friends and Family: Not on file  . Frequency of Social Gatherings with Friends and Family: Not on file  . Attends Religious Services: Not on file  . Active Member of Clubs or Organizations: Not on file  . Attends Archivist Meetings: Not on file  . Marital Status: Not on file     Family History: The patient's family history includes Heart disease in his mother; Stroke in his mother.  ROS:   Please see the history of present illness.     All other systems reviewed and are negative.  EKGs/Labs/Other Studies Reviewed:    The following studies were reviewed today: TTEchocardiogram date 02/05/2019 revealed severely reduced ejection fraction with EF 25%.   Moderately dilated left ventricle,  moderately dilated right ventricle. Moderate MR Moderate TR  EKG:  EKG is  ordered today.  The ekg ordered today demonstrates V paced rhythm, occasional PVCs.  Recent Labs: 03/29/2019:  Hemoglobin 13.4; Magnesium 2.4; Platelets 247 04/19/2019: BUN 40; Creatinine, Ser 1.05; Potassium 3.5; Sodium 136  Recent Lipid Panel    Component Value Date/Time   CHOL 231 (A) 03/06/2013 0000   TRIG 98 03/06/2013 0000   HDL 78 (A) 03/06/2013 0000   LDLCALC 133 03/06/2013 0000    Physical Exam:    VS:  BP (!) 70/50 (BP Location: Left Arm, Patient Position: Sitting, Cuff Size: Normal)   Pulse 86   Ht 5\' 6"  (1.676 m)   Wt 183 lb 9 oz (83.3 kg)   SpO2 97%   BMI 29.63 kg/m  Wt Readings from Last 3 Encounters:  04/24/19 183 lb 9 oz (83.3 kg)  04/13/19 186 lb (84.4 kg)  04/06/19 192 lb 6 oz (87.3 kg)     GEN:  Well nourished, well developed in mild distress due to back pain, frail looking HEENT: Normal NECK: No JVD; No carotid bruits LYMPHATICS: No lymphadenopathy CARDIAC: Irregular irregular, no murmurs, rubs, gallops RESPIRATORY:  Clear to auscultation without rales, wheezing or rhonchi  ABDOMEN: Soft, non-tender, non-distended MUSCULOSKELETAL: Trace to 1+ edema; No deformity  SKIN: Warm and dry NEUROLOGIC:  Alert and oriented x 3 PSYCHIATRIC:  Normal affect   ASSESSMENT:   Severely reduced EF, EF 25%.  Lower extremity edema has improved on physical exam.  His dry weight is about 180 pounds.  Today's weight is 183 pounds.  Blood pressure is low at 70/50.    1. Chronic systolic heart failure (HCC)   2. Paroxysmal atrial fibrillation ([place])   3. Medication management    PLAN:    In order of problems listed above:  1. Decrease torsemide to 40 mg daily.  Check BMP 2 days prior to follow-up appointment.  Monitor daily weights.  Patient's dry weight is around 180 pounds. Holding off on heart failure medications in light of aggressive diuresing and low blood pressure.  We will plan to start CHF meds if blood pressure tolerates after adequate diuresing. 2. Heart rate controlled.  Continue Eliquis. 3. Sick sinus syndrome status post leadless pacemaker.  Keep appointment  with EP/device clinic for frequent checks.  Follow-up in 1 month  This note was generated in part or whole with voice recognition software. Voice recognition is usually quite accurate but there are transcription errors that can and very often do occur. I apologize for any typographical errors that were not detected and corrected.  Medication Adjustments/Labs and Tests Ordered: Current medicines are reviewed at length with the patient today.  Concerns regarding medicines are outlined above.  Orders Placed This Encounter  Procedures  . Basic Metabolic Panel (BMET)  . EKG 12-Lead   Meds ordered this encounter  Medications  . torsemide (DEMADEX) 20 MG tablet    Sig: Take 2 tablets (40 mg total) by mouth daily.    Dispense:  120 tablet    Refill:  3    Stopping lasix    Patient Instructions  Medication Instructions:  Your physician has recommended you make the following change in your medication:  1- DECREASE Torsemide to 40 mg by mouth once a day.  *If you need a refill on your cardiac medications before your next appointment, please call your pharmacy*  Lab Work: Your physician recommends that you return for lab work in: about 1 month (about 2 days prior to appointment). BMET.  - Please go to the [facility]. You will check in at the front desk to the right as you walk into the atrium. Valet Parking is offered if needed. - No appointment needed. You may go any day between 7 am and 6 pm.  If you have labs (blood work) drawn today and your tests are completely normal, you will receive your results only by: Marland Kitchen MyChart Message (if you have MyChart) OR . A paper copy in the mail If you have any lab test that is abnormal or we need to change your treatment, we will call you to review the results.  Testing/Procedures: none  Follow-Up: At [facility], you and your health needs are our priority.  As part of our continuing mission  to provide you with exceptional heart care, we  have created designated Provider Care Teams.  These Care Teams include your primary Cardiologist (physician) and Advanced Practice Providers (APPs -  Physician Assistants and Nurse Practitioners) who all work together to provide you with the care you need, when you need it.  Your next appointment:   1 month(s)  The format for your next appointment:   In Person  Provider:   Kate Sable, MD     Signed, Kate Sable, MD  04/24/2019 5:07 PM    [place]

## 2019-04-24 NOTE — Patient Instructions (Signed)
Medication Instructions:  Your physician has recommended you make the following change in your medication:  1- DECREASE Torsemide to 40 mg by mouth once a day.  *If you need a refill on your cardiac medications before your next appointment, please call your pharmacy*  Lab Work: Your physician recommends that you return for lab work in: about 1 month (about 2 days prior to appointment). BMET.  - Please go to the [facility]. You will check in at the front desk to the right as you walk into the atrium. Valet Parking is offered if needed. - No appointment needed. You may go any day between 7 am and 6 pm.  If you have labs (blood work) drawn today and your tests are completely normal, you will receive your results only by: Marland Kitchen MyChart Message (if you have MyChart) OR . A paper copy in the mail If you have any lab test that is abnormal or we need to change your treatment, we will call you to review the results.  Testing/Procedures: none  Follow-Up: At [facility], you and your health needs are our priority.  As part of our continuing mission to provide you with exceptional heart care, we have created designated Provider Care Teams.  These Care Teams include your primary Cardiologist (physician) and Advanced Practice Providers (APPs -  Physician Assistants and Nurse Practitioners) who all work together to provide you with the care you need, when you need it.  Your next appointment:   1 month(s)  The format for your next appointment:   In Person  Provider:   Kate Sable, MD

## 2019-04-25 DIAGNOSIS — Z9181 History of falling: Secondary | ICD-10-CM | POA: Diagnosis not present

## 2019-04-25 DIAGNOSIS — I482 Chronic atrial fibrillation, unspecified: Secondary | ICD-10-CM | POA: Diagnosis not present

## 2019-04-25 DIAGNOSIS — M1990 Unspecified osteoarthritis, unspecified site: Secondary | ICD-10-CM | POA: Diagnosis not present

## 2019-04-25 DIAGNOSIS — I495 Sick sinus syndrome: Secondary | ICD-10-CM | POA: Diagnosis not present

## 2019-04-25 DIAGNOSIS — E782 Mixed hyperlipidemia: Secondary | ICD-10-CM | POA: Diagnosis not present

## 2019-04-25 DIAGNOSIS — I209 Angina pectoris, unspecified: Secondary | ICD-10-CM | POA: Diagnosis not present

## 2019-04-25 DIAGNOSIS — R300 Dysuria: Secondary | ICD-10-CM | POA: Diagnosis not present

## 2019-04-25 DIAGNOSIS — M8008XD Age-related osteoporosis with current pathological fracture, vertebra(e), subsequent encounter for fracture with routine healing: Secondary | ICD-10-CM | POA: Diagnosis not present

## 2019-04-25 DIAGNOSIS — Z87440 Personal history of urinary (tract) infections: Secondary | ICD-10-CM | POA: Diagnosis not present

## 2019-04-25 DIAGNOSIS — Z87891 Personal history of nicotine dependence: Secondary | ICD-10-CM | POA: Diagnosis not present

## 2019-04-25 DIAGNOSIS — M48061 Spinal stenosis, lumbar region without neurogenic claudication: Secondary | ICD-10-CM | POA: Diagnosis not present

## 2019-04-25 DIAGNOSIS — I110 Hypertensive heart disease with heart failure: Secondary | ICD-10-CM | POA: Diagnosis not present

## 2019-04-25 DIAGNOSIS — Z950 Presence of cardiac pacemaker: Secondary | ICD-10-CM | POA: Diagnosis not present

## 2019-04-25 DIAGNOSIS — I5023 Acute on chronic systolic (congestive) heart failure: Secondary | ICD-10-CM | POA: Diagnosis not present

## 2019-04-25 DIAGNOSIS — Z960 Presence of urogenital implants: Secondary | ICD-10-CM | POA: Diagnosis not present

## 2019-04-25 DIAGNOSIS — Z7901 Long term (current) use of anticoagulants: Secondary | ICD-10-CM | POA: Diagnosis not present

## 2019-05-01 DIAGNOSIS — E782 Mixed hyperlipidemia: Secondary | ICD-10-CM | POA: Diagnosis not present

## 2019-05-01 DIAGNOSIS — M1990 Unspecified osteoarthritis, unspecified site: Secondary | ICD-10-CM | POA: Diagnosis not present

## 2019-05-01 DIAGNOSIS — Z7901 Long term (current) use of anticoagulants: Secondary | ICD-10-CM | POA: Diagnosis not present

## 2019-05-01 DIAGNOSIS — I209 Angina pectoris, unspecified: Secondary | ICD-10-CM | POA: Diagnosis not present

## 2019-05-01 DIAGNOSIS — M48061 Spinal stenosis, lumbar region without neurogenic claudication: Secondary | ICD-10-CM | POA: Diagnosis not present

## 2019-05-01 DIAGNOSIS — Z87440 Personal history of urinary (tract) infections: Secondary | ICD-10-CM | POA: Diagnosis not present

## 2019-05-01 DIAGNOSIS — M8008XD Age-related osteoporosis with current pathological fracture, vertebra(e), subsequent encounter for fracture with routine healing: Secondary | ICD-10-CM | POA: Diagnosis not present

## 2019-05-01 DIAGNOSIS — Z9181 History of falling: Secondary | ICD-10-CM | POA: Diagnosis not present

## 2019-05-01 DIAGNOSIS — Z87891 Personal history of nicotine dependence: Secondary | ICD-10-CM | POA: Diagnosis not present

## 2019-05-01 DIAGNOSIS — Z950 Presence of cardiac pacemaker: Secondary | ICD-10-CM | POA: Diagnosis not present

## 2019-05-01 DIAGNOSIS — I482 Chronic atrial fibrillation, unspecified: Secondary | ICD-10-CM | POA: Diagnosis not present

## 2019-05-01 DIAGNOSIS — R300 Dysuria: Secondary | ICD-10-CM | POA: Diagnosis not present

## 2019-05-01 DIAGNOSIS — I495 Sick sinus syndrome: Secondary | ICD-10-CM | POA: Diagnosis not present

## 2019-05-01 DIAGNOSIS — I110 Hypertensive heart disease with heart failure: Secondary | ICD-10-CM | POA: Diagnosis not present

## 2019-05-01 DIAGNOSIS — Z960 Presence of urogenital implants: Secondary | ICD-10-CM | POA: Diagnosis not present

## 2019-05-01 DIAGNOSIS — I5023 Acute on chronic systolic (congestive) heart failure: Secondary | ICD-10-CM | POA: Diagnosis not present

## 2019-05-02 ENCOUNTER — Telehealth: Payer: Self-pay | Admitting: Cardiology

## 2019-05-02 ENCOUNTER — Other Ambulatory Visit (HOSPITAL_COMMUNITY): Payer: Self-pay

## 2019-05-02 DIAGNOSIS — I110 Hypertensive heart disease with heart failure: Secondary | ICD-10-CM | POA: Diagnosis not present

## 2019-05-02 DIAGNOSIS — Z7901 Long term (current) use of anticoagulants: Secondary | ICD-10-CM | POA: Diagnosis not present

## 2019-05-02 DIAGNOSIS — I482 Chronic atrial fibrillation, unspecified: Secondary | ICD-10-CM | POA: Diagnosis not present

## 2019-05-02 DIAGNOSIS — E782 Mixed hyperlipidemia: Secondary | ICD-10-CM | POA: Diagnosis not present

## 2019-05-02 DIAGNOSIS — Z87440 Personal history of urinary (tract) infections: Secondary | ICD-10-CM | POA: Diagnosis not present

## 2019-05-02 DIAGNOSIS — Z960 Presence of urogenital implants: Secondary | ICD-10-CM | POA: Diagnosis not present

## 2019-05-02 DIAGNOSIS — I495 Sick sinus syndrome: Secondary | ICD-10-CM | POA: Diagnosis not present

## 2019-05-02 DIAGNOSIS — Z87891 Personal history of nicotine dependence: Secondary | ICD-10-CM | POA: Diagnosis not present

## 2019-05-02 DIAGNOSIS — Z9181 History of falling: Secondary | ICD-10-CM | POA: Diagnosis not present

## 2019-05-02 DIAGNOSIS — Z950 Presence of cardiac pacemaker: Secondary | ICD-10-CM | POA: Diagnosis not present

## 2019-05-02 DIAGNOSIS — I209 Angina pectoris, unspecified: Secondary | ICD-10-CM | POA: Diagnosis not present

## 2019-05-02 DIAGNOSIS — I5023 Acute on chronic systolic (congestive) heart failure: Secondary | ICD-10-CM | POA: Diagnosis not present

## 2019-05-02 DIAGNOSIS — M1990 Unspecified osteoarthritis, unspecified site: Secondary | ICD-10-CM | POA: Diagnosis not present

## 2019-05-02 DIAGNOSIS — M48061 Spinal stenosis, lumbar region without neurogenic claudication: Secondary | ICD-10-CM | POA: Diagnosis not present

## 2019-05-02 DIAGNOSIS — M8008XD Age-related osteoporosis with current pathological fracture, vertebra(e), subsequent encounter for fracture with routine healing: Secondary | ICD-10-CM | POA: Diagnosis not present

## 2019-05-02 DIAGNOSIS — R300 Dysuria: Secondary | ICD-10-CM | POA: Diagnosis not present

## 2019-05-02 NOTE — Telephone Encounter (Signed)
It looks like Atorvastatin 10 mg qd was d/c on 04/06/19. Message fwd to Dr. Garen Lah to advised if the Atorvastatin should d/c or continued.

## 2019-05-02 NOTE — Telephone Encounter (Signed)
Linda from [facility] is calling to check on the status of patients atorvastatin. She noticed it was missing from last office note and she is trying to follow up on his medication management.  Please advise when able

## 2019-05-03 ENCOUNTER — Encounter (HOSPITAL_COMMUNITY): Payer: Self-pay

## 2019-05-03 NOTE — Progress Notes (Signed)
Today had a first visit with [name].  He states feeling much better than he was.  He weighs daily.  His weight has been staying close to same everyday.  He watches what he eats, he does not add salt.  Verified his meds, he has a retired Dietitian that places his meds in his pill box.  They appear to be right.  He is aware of what he takes and when.  He ambulates with walker.  He has nursing that is coming with PT and OT.  He eats small meals throughout the day, he states just not a big eater anymore.  Discussed fluid daily intake.  He has no swelling in lower extremities.  He has everything he needs.  He does not want a lot of in person visits but appreciates phone calls.  He is interested in the program and left info and phone number.  He is aware of red, yellow and green zones.  Advised him to contact me if weight gain or weight loss.  He denies any chest pain, shortness of breath, headaches or dizziness.  He tries to stay active.  Will continue to visit for heart failure.   [place] [phone]

## 2019-05-07 NOTE — Telephone Encounter (Signed)
Returned the call to [employer] @ the [facility]. lmom on their secure voicemail with Dr. Thereasa Solo response and recommendation. She is to contact the office of any questions.

## 2019-05-09 ENCOUNTER — Ambulatory Visit: Payer: Medicare Other | Admitting: Family Medicine

## 2019-05-09 DIAGNOSIS — E782 Mixed hyperlipidemia: Secondary | ICD-10-CM | POA: Diagnosis not present

## 2019-05-09 DIAGNOSIS — M1990 Unspecified osteoarthritis, unspecified site: Secondary | ICD-10-CM | POA: Diagnosis not present

## 2019-05-09 DIAGNOSIS — Z950 Presence of cardiac pacemaker: Secondary | ICD-10-CM | POA: Diagnosis not present

## 2019-05-09 DIAGNOSIS — I482 Chronic atrial fibrillation, unspecified: Secondary | ICD-10-CM | POA: Diagnosis not present

## 2019-05-09 DIAGNOSIS — I5023 Acute on chronic systolic (congestive) heart failure: Secondary | ICD-10-CM | POA: Diagnosis not present

## 2019-05-09 DIAGNOSIS — I110 Hypertensive heart disease with heart failure: Secondary | ICD-10-CM | POA: Diagnosis not present

## 2019-05-09 DIAGNOSIS — Z9181 History of falling: Secondary | ICD-10-CM | POA: Diagnosis not present

## 2019-05-09 DIAGNOSIS — Z87891 Personal history of nicotine dependence: Secondary | ICD-10-CM | POA: Diagnosis not present

## 2019-05-09 DIAGNOSIS — I495 Sick sinus syndrome: Secondary | ICD-10-CM | POA: Diagnosis not present

## 2019-05-09 DIAGNOSIS — R300 Dysuria: Secondary | ICD-10-CM | POA: Diagnosis not present

## 2019-05-09 DIAGNOSIS — Z960 Presence of urogenital implants: Secondary | ICD-10-CM | POA: Diagnosis not present

## 2019-05-09 DIAGNOSIS — I209 Angina pectoris, unspecified: Secondary | ICD-10-CM | POA: Diagnosis not present

## 2019-05-09 DIAGNOSIS — Z87440 Personal history of urinary (tract) infections: Secondary | ICD-10-CM | POA: Diagnosis not present

## 2019-05-09 DIAGNOSIS — M48061 Spinal stenosis, lumbar region without neurogenic claudication: Secondary | ICD-10-CM | POA: Diagnosis not present

## 2019-05-09 DIAGNOSIS — Z7901 Long term (current) use of anticoagulants: Secondary | ICD-10-CM | POA: Diagnosis not present

## 2019-05-09 DIAGNOSIS — M8008XD Age-related osteoporosis with current pathological fracture, vertebra(e), subsequent encounter for fracture with routine healing: Secondary | ICD-10-CM | POA: Diagnosis not present

## 2019-05-15 ENCOUNTER — Telehealth (HOSPITAL_COMMUNITY): Payer: Self-pay

## 2019-05-15 NOTE — Telephone Encounter (Signed)
Today contacted [name] to have a phone visit.  He states doing ok.  He states his weight had went up a bit but is back down to 183 lbs today.  He has not felt bad, had a just a little swelling in ankles but gone today.  He has been eating and drinking.  He likes to drink milk.  He drinks a little bit of water.  He does not eat a lot at meals, he nibbles throughout the day.  He has all his medications, his neighbor fixes them in his med box for him.  He is aware of what he takes and how to take it.  He has several appts next week, will set up appt to visit at home in 2 weeks.  Advised him to call me is I can help with anything, he appears to understand.  He denies chest pain, shortness of breath, headaches or dizziness.    [place] [facility] [phone]

## 2019-05-18 DIAGNOSIS — I739 Peripheral vascular disease, unspecified: Secondary | ICD-10-CM | POA: Diagnosis not present

## 2019-05-18 DIAGNOSIS — M79671 Pain in right foot: Secondary | ICD-10-CM | POA: Diagnosis not present

## 2019-05-18 DIAGNOSIS — B351 Tinea unguium: Secondary | ICD-10-CM | POA: Diagnosis not present

## 2019-05-18 DIAGNOSIS — M545 Low back pain: Secondary | ICD-10-CM | POA: Diagnosis not present

## 2019-05-18 DIAGNOSIS — G629 Polyneuropathy, unspecified: Secondary | ICD-10-CM | POA: Diagnosis not present

## 2019-05-22 ENCOUNTER — Ambulatory Visit: Payer: Medicare Other | Admitting: Internal Medicine

## 2019-05-23 ENCOUNTER — Other Ambulatory Visit
Admission: RE | Admit: 2019-05-23 | Discharge: 2019-05-23 | Disposition: A | Discharge status: Home / Self Care | Payer: Medicare Other | Source: Ambulatory Visit | Attending: Cardiology | Admitting: Cardiology

## 2019-05-23 ENCOUNTER — Ambulatory Visit: Payer: Medicare Other | Admitting: Urology

## 2019-05-23 DIAGNOSIS — I480 Paroxysmal atrial fibrillation: Secondary | ICD-10-CM | POA: Diagnosis not present

## 2019-05-23 DIAGNOSIS — I5022 Chronic systolic (congestive) heart failure: Secondary | ICD-10-CM | POA: Insufficient documentation

## 2019-05-23 DIAGNOSIS — Z79899 Other long term (current) drug therapy: Secondary | ICD-10-CM | POA: Insufficient documentation

## 2019-05-23 LAB — BASIC METABOLIC PANEL
Anion gap: 10 (ref 5–15)
BUN: 30 mg/dL — ABNORMAL HIGH (ref 8–23)
CO2: 29 mmol/L (ref 22–32)
Calcium: 9.3 mg/dL (ref 8.9–10.3)
Chloride: 98 mmol/L (ref 98–111)
Creatinine, Ser: 1 mg/dL (ref 0.61–1.24)
GFR calc Af Amer: >60 mL/min (ref >60)
GFR calc non Af Amer: >60 mL/min (ref >60)
Glucose, Bld: 90 mg/dL (ref 70–99)
Potassium: 4.1 mmol/L (ref 3.5–5.1)
Sodium: 137 mmol/L (ref 135–145)

## 2019-05-24 ENCOUNTER — Encounter: Payer: Self-pay | Admitting: Internal Medicine

## 2019-05-24 ENCOUNTER — Other Ambulatory Visit: Payer: Self-pay

## 2019-05-24 ENCOUNTER — Ambulatory Visit (INDEPENDENT_AMBULATORY_CARE_PROVIDER_SITE_OTHER): Payer: Medicare Other | Admitting: Internal Medicine

## 2019-05-24 VITALS — BP 98/60 | HR 89 | Ht 66.0 in | Wt 186.6 lb

## 2019-05-24 DIAGNOSIS — I5022 Chronic systolic (congestive) heart failure: Secondary | ICD-10-CM | POA: Diagnosis not present

## 2019-05-24 DIAGNOSIS — I495 Sick sinus syndrome: Secondary | ICD-10-CM | POA: Diagnosis not present

## 2019-05-24 DIAGNOSIS — I480 Paroxysmal atrial fibrillation: Secondary | ICD-10-CM

## 2019-05-24 DIAGNOSIS — Z950 Presence of cardiac pacemaker: Secondary | ICD-10-CM

## 2019-05-24 NOTE — Progress Notes (Signed)
Patient Care Team: Birdie Sons, MD as PCP - General (Family Medicine) Kate Sable, MD as PCP - Cardiology (Cardiology)   HPI  Douglas Calhoun is a 86 y.o. male is seen to establish care for a pacemaker-Micra-Medtronic implanted 9/20 by Dr. [facility] for bradycardia in the setting of atrial fibrillation-permanent (last sinus rhythm on electrocardiogram 7/16, all ECGs between now and then were personally reviewed) he takes Eliquis.   Progressive LV dysfunction 2016 was normal.  2019 45-50%.  2020 25%.  Admission for congestive heart failure with active diuresis.  Improved.  Currently with dyspnea on exertion-stable, two-pillow orthopnea-stable, peripheral edema relatively stable.  Discussed end-of-life status and the role of palliative care see below  Date Cr K Hgb  1/21 1.0 4.1 13.4           Records and Results Reviewed   Past Medical History:  Diagnosis Date  . Achilles tendinitis   . Arrhythmia    atrial fibrillation  . Cataract   . CHF (congestive heart failure) ([place])   . Hearing loss   . Herpes zoster without complication [id number]  . Personal history of prostate cancer 10/08/2014   s/p 15 radiation treatments treated by Dr. Madelin Headings   . Varicose vein of leg     Past Surgical History:  Procedure Laterality Date  . APPENDECTOMY  1960's  . BREAST SURGERY     Breast Biopsy prior to 1960  . CATARACT EXTRACTION Left   . PACEMAKER LEADLESS INSERTION N/A 01/11/2019   Procedure: PACEMAKER LEADLESS INSERTION;  Surgeon: Isaias Cowman, MD;  Location: [place] [facility];  Service: Cardiovascular;  Laterality: N/A;    Current Meds  Medication Sig  . acetaminophen (TYLENOL) 325 MG tablet Take 650 mg by mouth 3 (three) times daily.  Marland Kitchen alendronate (FOSAMAX) 70 MG tablet Take 70 mg by mouth once a week. Take with a full glass of water on an empty stomach on Saturdays.  Marland Kitchen amitriptyline (ELAVIL) 25 MG tablet Take 25 mg by mouth at bedtime.  Marland Kitchen  apixaban (ELIQUIS) 5 MG TABS tablet Take 5 mg by mouth 2 (two) times daily.  . Ascorbic Acid (VITAMIN C) 1000 MG tablet Take 1,000 mg by mouth daily.  . Calcium Carbonate-Vitamin D (CALCIUM 500 + D PO) Take 1 tablet by mouth daily.  . cholecalciferol (VITAMIN D3) 25 MCG (1000 UT) tablet Take 2,000 Units by mouth daily.   . ferrous sulfate 325 (65 FE) MG EC tablet Take 325 mg by mouth daily with breakfast.  . gabapentin (NEURONTIN) 100 MG capsule TAKE 1 CAPSULE IN THE MORNING AND TWO AT NIGHT  . Multiple Vitamins-Minerals (MULTIVITAMIN ADULT PO) Take 1 tablet by mouth daily.  . Multiple Vitamins-Minerals (ZINC PO) Take by mouth daily.  . Polyethylene Glycol 3350 (MIRALAX PO) Take 1 Dose by mouth daily. Heaping teaspoon  . potassium chloride (K-DUR) 10 MEQ tablet TAKE 1 TABLET BY MOUTH DAILY (Patient taking differently: Take 10 mEq by mouth daily. )  . Probiotic CAPS Take 1 capsule by mouth daily.  . Psyllium (METAMUCIL PO) Take 1 Dose by mouth daily.  Marland Kitchen pyridOXINE (VITAMIN B-6) 100 MG tablet Take 100 mg by mouth daily.  . tamsulosin (FLOMAX) 0.4 MG CAPS capsule Take 0.4 mg by mouth daily after breakfast.   . torsemide (DEMADEX) 20 MG tablet Take 2 tablets (40 mg total) by mouth daily.  . Vitamin E 400 units TABS Take 1 tablet by mouth daily.    No Known  Allergies Family History  Problem Relation Age of Onset  . Heart disease Mother   . Stroke Mother    Social History   Socioeconomic History  . Marital status: Married    Spouse name: Not on file  . Number of children: Not on file  . Years of education: Not on file  . Highest education level: Not on file  Occupational History  . Occupation: retired  Tobacco Use  . Smoking status: Never Smoker  . Smokeless tobacco: Never Used  Substance and Sexual Activity  . Alcohol use: Yes    Alcohol/week: 0.0 standard drinks    Comment: drinks 2 beers a month  . Drug use: No  . Sexual activity: Not on file  Other Topics Concern  . Not on  file  Social History Narrative   Lives along at [place] Strain:   . Difficulty of Paying Living Expenses: Not on file  Food Insecurity:   . Worried About Charity fundraiser in the Last Year: Not on file  . Ran Out of Food in the Last Year: Not on file  Transportation Needs:   . Lack of Transportation (Medical): Not on file  . Lack of Transportation (Non-Medical): Not on file  Physical Activity:   . Days of Exercise per Week: Not on file  . Minutes of Exercise per Session: Not on file  Stress:   . Feeling of Stress : Not on file  Social Connections:   . Frequency of Communication with Friends and Family: Not on file  . Frequency of Social Gatherings with Friends and Family: Not on file  . Attends Religious Services: Not on file  . Active Member of Clubs or Organizations: Not on file  . Attends Archivist Meetings: Not on file  . Marital Status: Not on file     Review of Systems negative except from HPI and PMH  Physical Exam BP 98/60 (BP Location: Left Arm, Patient Position: Sitting, Cuff Size: Normal)   Pulse 89   Ht 5\' 6"  (1.676 m)   Wt 186 lb 9 oz (84.6 kg)   BMI 30.11 kg/m  Well developed and well nourished in no acute distress HENT normal E scleral and icterus clear Neck Supple JVP <10 Clear to ausculation Irregular rate and rhythm, no murmurs gallops or rub Soft with active bowel sounds No clubbing cyanosis Trace Edema lower extremity rubor Alert and oriented, grossly normal motor and sensory function Skin Warm and Dry  ECG atrial fibrillation with intrinsic conduction and intermittent ventricular pacing and frequent PVCs  Estimated Creatinine Clearance: 43.1 mL/min (by C-G formula based on SCr of 1 mg/dL).   Assessment and  Plan Atrial fibrillation  Bradycardia  Pacemaker-leadless-Medtronic (DOI 9/20)  Cardiomyopathy-mechanism unknown  PVCs  End-of-life discussion   The patient's  functional status is stable.  Close attention to his weight.  We will increase his diuretic to 40/40 on days when his weight is gone up 5 pounds over 3 days.,  Target weight remains about 183 pounds.  He was ventricularly pacing at 45% at a rate of 70.  We will decrease his pacing rate to 60.  He may well benefit from a lower pacing rate of 50.  Discussed the role of palliative care.  He will discuss this with Dr. Caryn Section.  We also discussed his CODE STATUS.  His caregiver/neighbor has had many conversations with him and he would like to be left alone  and go upstairs if he were to die.  I have taken the liberty and written a DNR order.   Current medicines are reviewed at length with the patient today .  The patient does not  have concerns regarding medicines.

## 2019-05-24 NOTE — Patient Instructions (Signed)
Medication Instructions:  - Your physician recommends that you continue on your current medications as directed. Please refer to the Current Medication list given to you today.  *If you need a refill on your cardiac medications before your next appointment, please call your pharmacy*  Lab Work: - none ordered  If you have labs (blood work) drawn today and your tests are completely normal, you will receive your results only by: . MyChart Message (if you have MyChart) OR . A paper copy in the mail If you have any lab test that is abnormal or we need to change your treatment, we will call you to review the results.  Testing/Procedures: - none ordered  Follow-Up: At [facility], you and your health needs are our priority.  As part of our continuing mission to provide you with exceptional heart care, we have created designated Provider Care Teams.  These Care Teams include your primary Cardiologist (physician) and Advanced Practice Providers (APPs -  Physician Assistants and Nurse Practitioners) who all work together to provide you with the care you need, when you need it.  Your next appointment:   1 year(s)  The format for your next appointment:   In Person  Provider:   Steven Klein, MD  Other Instructions - n/a  

## 2019-05-25 ENCOUNTER — Encounter: Payer: Self-pay | Admitting: Cardiology

## 2019-05-25 ENCOUNTER — Ambulatory Visit (INDEPENDENT_AMBULATORY_CARE_PROVIDER_SITE_OTHER): Payer: Medicare Other | Admitting: Cardiology

## 2019-05-25 VITALS — BP 106/60 | Ht 66.0 in | Wt 186.0 lb

## 2019-05-25 DIAGNOSIS — I5022 Chronic systolic (congestive) heart failure: Secondary | ICD-10-CM | POA: Diagnosis not present

## 2019-05-25 DIAGNOSIS — I480 Paroxysmal atrial fibrillation: Secondary | ICD-10-CM

## 2019-05-25 DIAGNOSIS — I495 Sick sinus syndrome: Secondary | ICD-10-CM | POA: Diagnosis not present

## 2019-05-25 MED ORDER — TORSEMIDE 20 MG PO TABS: ORAL_TABLET | ORAL | 3 refills | Status: DC

## 2019-05-25 NOTE — Progress Notes (Signed)
Cardiology Office Note:    Date:  05/25/2019   ID:  Douglas Calhoun, DOB 1919/04/30, MRN [id number]  PCP:  Birdie Sons, MD  Cardiologist:  Kate Sable, MD  Electrophysiologist:  None   Referring MD: Birdie Sons, MD   Chief Complaint  Patient presents with  . Other    1 month follow up. Patient c.o SOB and swelling. Meds reviewed verbally with patient.     History of Present Illness:    Douglas Calhoun is a 86 y.o. male with a hx of atrial fibrillation on Eliquis, sick sinus syndrome status post leadless pacemaker 01/11/2019,  heart failure reduced ejection fraction, last EF 25% on 02/05/2019, who presents for follow-up.  He is being seen for congestive heart failure and lower extremity edema.  Patient was diagnosed with lumbar compression fracture in November 2020.  He was admitted at [facility].  Surgery was not performed due to his age and medical management recommended.  He was noted to be fluid overloaded at the time and was adequately diuresed.  On discharge, his weight was 180 pounds.     He has urinary obstruction and self caths themselves.  He denies any history of heart attacks or CAD.  Last echocardiogram date 02/05/2019 revealed severely reduced ejection fraction with EF 25%.  Moderately dilated left ventricle, moderately dilated right ventricle.  His torsemide was decreased to 40 mg daily due to hypotension with blood pressure of 70/50.  He states retaining more fluid.  BP around 186 today.  Past Medical History:  Diagnosis Date  . Achilles tendinitis   . Arrhythmia    atrial fibrillation  . Cataract   . CHF (congestive heart failure) ([place])   . Hearing loss   . Herpes zoster without complication [id number]  . Personal history of prostate cancer 10/08/2014   s/p 15 radiation treatments treated by Dr. Madelin Headings   . Varicose vein of leg     Past Surgical History:  Procedure Laterality Date  . APPENDECTOMY  1960's  . BREAST SURGERY     Breast Biopsy prior to  1960  . CATARACT EXTRACTION Left   . PACEMAKER LEADLESS INSERTION N/A 01/11/2019   Procedure: PACEMAKER LEADLESS INSERTION;  Surgeon: Isaias Cowman, MD;  Location: [place] [facility];  Service: Cardiovascular;  Laterality: N/A;    Current Medications: Current Meds  Medication Sig  . acetaminophen (TYLENOL) 325 MG tablet Take 650 mg by mouth 3 (three) times daily.  Marland Kitchen alendronate (FOSAMAX) 70 MG tablet Take 70 mg by mouth once a week. Take with a full glass of water on an empty stomach on Saturdays.  Marland Kitchen amitriptyline (ELAVIL) 25 MG tablet Take 25 mg by mouth at bedtime.  Marland Kitchen apixaban (ELIQUIS) 5 MG TABS tablet Take 5 mg by mouth 2 (two) times daily.  . Ascorbic Acid (VITAMIN C) 1000 MG tablet Take 1,000 mg by mouth daily.  . Calcium Carbonate-Vitamin D (CALCIUM 500 + D PO) Take 1 tablet by mouth daily.  . cholecalciferol (VITAMIN D3) 25 MCG (1000 UT) tablet Take 2,000 Units by mouth daily.   . ferrous sulfate 325 (65 FE) MG EC tablet Take 325 mg by mouth daily with breakfast.  . gabapentin (NEURONTIN) 100 MG capsule TAKE 1 CAPSULE IN THE MORNING AND TWO AT NIGHT  . Multiple Vitamins-Minerals (MULTIVITAMIN ADULT PO) Take 1 tablet by mouth daily.  . Multiple Vitamins-Minerals (ZINC PO) Take by mouth daily.  . Polyethylene Glycol 3350 (MIRALAX PO) Take 1 Dose by mouth daily.  Heaping teaspoon  . potassium chloride (K-DUR) 10 MEQ tablet TAKE 1 TABLET BY MOUTH DAILY (Patient taking differently: Take 10 mEq by mouth daily. )  . Probiotic CAPS Take 1 capsule by mouth daily.  . Psyllium (METAMUCIL PO) Take 1 Dose by mouth daily.  Marland Kitchen pyridOXINE (VITAMIN B-6) 100 MG tablet Take 100 mg by mouth daily.  . tamsulosin (FLOMAX) 0.4 MG CAPS capsule Take 0.4 mg by mouth daily after breakfast.   . torsemide (DEMADEX) 20 MG tablet Take 40 mg (2 tablets) in the am and 20 mg (1 tablet) in the pm  . Vitamin E 400 units TABS Take 1 tablet by mouth daily.  . [DISCONTINUED] torsemide (DEMADEX) 20 MG tablet Take  2 tablets (40 mg total) by mouth daily.     Allergies:   Patient has no known allergies.   Social History   Socioeconomic History  . Marital status: Married    Spouse name: Not on file  . Number of children: Not on file  . Years of education: Not on file  . Highest education level: Not on file  Occupational History  . Occupation: retired  Tobacco Use  . Smoking status: Never Smoker  . Smokeless tobacco: Never Used  Substance and Sexual Activity  . Alcohol use: Yes    Alcohol/week: 0.0 standard drinks    Comment: drinks 2 beers a month  . Drug use: No  . Sexual activity: Not on file  Other Topics Concern  . Not on file  Social History Narrative   Lives along at [place] Strain:   . Difficulty of Paying Living Expenses: Not on file  Food Insecurity:   . Worried About Charity fundraiser in the Last Year: Not on file  . Ran Out of Food in the Last Year: Not on file  Transportation Needs:   . Lack of Transportation (Medical): Not on file  . Lack of Transportation (Non-Medical): Not on file  Physical Activity:   . Days of Exercise per Week: Not on file  . Minutes of Exercise per Session: Not on file  Stress:   . Feeling of Stress : Not on file  Social Connections:   . Frequency of Communication with Friends and Family: Not on file  . Frequency of Social Gatherings with Friends and Family: Not on file  . Attends Religious Services: Not on file  . Active Member of Clubs or Organizations: Not on file  . Attends Archivist Meetings: Not on file  . Marital Status: Not on file     Family History: The patient's family history includes Heart disease in his mother; Stroke in his mother.  ROS:   Please see the history of present illness.     All other systems reviewed and are negative.  EKGs/Labs/Other Studies Reviewed:    The following studies were reviewed today: TTEchocardiogram date 02/05/2019 revealed  severely reduced ejection fraction with EF 25%.   Moderately dilated left ventricle,  moderately dilated right ventricle. Moderate MR Moderate TR  EKG:  EKG was not obtained today  Recent Labs: 03/29/2019: Hemoglobin 13.4; Magnesium 2.4; Platelets 247 05/23/2019: BUN 30; Creatinine, Ser 1.00; Potassium 4.1; Sodium 137  Recent Lipid Panel    Component Value Date/Time   CHOL 231 (A) 03/06/2013 0000   TRIG 98 03/06/2013 0000   HDL 78 (A) 03/06/2013 0000   LDLCALC 133 03/06/2013 0000    Physical Exam:    VS:  BP 106/60 (BP Location: Right Arm, Patient Position: Sitting, Cuff Size: Normal)   Ht 5\' 6"  (1.676 m)   Wt 186 lb (84.4 kg)   BMI 30.02 kg/m     Wt Readings from Last 3 Encounters:  05/25/19 186 lb (84.4 kg)  05/24/19 186 lb 9 oz (84.6 kg)  05/03/19 185 lb (83.9 kg)     GEN:  Well nourished, well developed in mild distress due to back pain, frail looking HEENT: Normal NECK: No JVD; No carotid bruits LYMPHATICS: No lymphadenopathy CARDIAC: Irregular irregular, no murmurs, rubs, gallops RESPIRATORY:  Clear to auscultation without rales, wheezing or rhonchi  ABDOMEN: Soft, non-tender, non-distended MUSCULOSKELETAL: Trace to 1+ edema; No deformity  SKIN: Warm and dry NEUROLOGIC:  Alert and oriented x 3 PSYCHIATRIC:  Normal affect    ASSESSMENT:    1. Chronic systolic heart failure (HCC)   2. Paroxysmal atrial fibrillation ([place])   3. Sick sinus syndrome (HCC)    PLAN:    In order of problems listed above:  1. Severely reduced EF, last EF 25%.  Patient's weight has increased slightly to 186. Increase torsemide to 40 mg in the morning and 20 mg in the evening.  Monitor daily weights.  Patient's dry weight is around 180 pounds. Holding off on heart failure medications in light of aggressive diuresing and low blood pressure.  We will plan to start CHF meds if blood pressure tolerates after adequate diuresing. 2. History of paroxysmal atrial fibrillation.  Continue  Eliquis. 3. Sick sinus syndrome status post leadless pacemaker.  Keep appointment with EP/device clinic for frequent checks.  Follow-up in 1 month  This note was generated in part or whole with voice recognition software. Voice recognition is usually quite accurate but there are transcription errors that can and very often do occur. I apologize for any typographical errors that were not detected and corrected.  Medication Adjustments/Labs and Tests Ordered: Current medicines are reviewed at length with the patient today.  Concerns regarding medicines are outlined above.  No orders of the defined types were placed in this encounter.  Meds ordered this encounter  Medications  . torsemide (DEMADEX) 20 MG tablet    Sig: Take 40 mg (2 tablets) in the am and 20 mg (1 tablet) in the pm    Dispense:  90 tablet    Refill:  3    Dosage increase    Patient Instructions  Medication Instructions:  Your physician has recommended you make the following change in your medication:   INCREASE Torsemide to 40 mg (2 tablets) in the am and 20 mg (1 tablet) in the pm.  An Rx has been sent to your pharmacy.  *If you need a refill on your cardiac medications before your next appointment, please call your pharmacy*  Lab Work: None ordered If you have labs (blood work) drawn today and your tests are completely normal, you will receive your results only by: Marland Kitchen MyChart Message (if you have MyChart) OR . A paper copy in the mail If you have any lab test that is abnormal or we need to change your treatment, we will call you to review the results.  Testing/Procedures: None ordered  Follow-Up: At [facility], you and your health needs are our priority.  As part of our continuing mission to provide you with exceptional heart care, we have created designated Provider Care Teams.  These Care Teams include your primary Cardiologist (physician) and Advanced Practice Providers (APPs -  Physician Assistants and  Nurse Practitioners) who all work together to provide you with the care you need, when you need it.  Your next appointment:   3 month(s)  The format for your next appointment:   In Person  Provider:    You may see Kate Sable, MD or one of the following Advanced Practice Providers on your designated Care Team:    Murray Hodgkins, NP  [name], PA-C  Marrianne Mood, PA-C   Other Instructions N/A    Signed, Kate Sable, MD  05/25/2019 4:46 PM    [place]

## 2019-05-25 NOTE — Patient Instructions (Signed)
Medication Instructions:  Your physician has recommended you make the following change in your medication:   INCREASE Torsemide to 40 mg (2 tablets) in the am and 20 mg (1 tablet) in the pm.  An Rx has been sent to your pharmacy.  *If you need a refill on your cardiac medications before your next appointment, please call your pharmacy*  Lab Work: None ordered If you have labs (blood work) drawn today and your tests are completely normal, you will receive your results only by: Marland Kitchen MyChart Message (if you have MyChart) OR . A paper copy in the mail If you have any lab test that is abnormal or we need to change your treatment, we will call you to review the results.  Testing/Procedures: None ordered  Follow-Up: At [facility], you and your health needs are our priority.  As part of our continuing mission to provide you with exceptional heart care, we have created designated Provider Care Teams.  These Care Teams include your primary Cardiologist (physician) and Advanced Practice Providers (APPs -  Physician Assistants and Nurse Practitioners) who all work together to provide you with the care you need, when you need it.  Your next appointment:   3 month(s)  The format for your next appointment:   In Person  Provider:    You may see Kate Sable, MD or one of the following Advanced Practice Providers on your designated Care Team:    Murray Hodgkins, NP  [name], PA-C  Marrianne Mood, PA-C   Other Instructions N/A

## 2019-05-29 ENCOUNTER — Ambulatory Visit: Payer: Medicare Other | Admitting: Family Medicine

## 2019-06-08 ENCOUNTER — Other Ambulatory Visit: Payer: Self-pay

## 2019-06-08 ENCOUNTER — Ambulatory Visit (INDEPENDENT_AMBULATORY_CARE_PROVIDER_SITE_OTHER): Payer: Medicare Other | Admitting: Family Medicine

## 2019-06-08 ENCOUNTER — Encounter: Payer: Self-pay | Admitting: Family Medicine

## 2019-06-08 VITALS — BP 107/67 | HR 74 | Temp 96.6°F | Wt 185.4 lb

## 2019-06-08 DIAGNOSIS — M7022 Olecranon bursitis, left elbow: Secondary | ICD-10-CM

## 2019-06-08 DIAGNOSIS — K2970 Gastritis, unspecified, without bleeding: Secondary | ICD-10-CM

## 2019-06-08 DIAGNOSIS — R609 Edema, unspecified: Secondary | ICD-10-CM

## 2019-06-08 MED ORDER — FAMOTIDINE 20 MG PO TABS: 20.0000 mg | ORAL_TABLET | Freq: Two times a day (BID) | ORAL | 2 refills | Status: DC

## 2019-06-08 NOTE — Progress Notes (Signed)
Patient: Douglas Calhoun Male    DOB: 06-24-1919   86 y.o.   MRN: [id number] Visit Date: 06/08/2019  Today's Provider: Lelon Huh, MD   Chief Complaint  Patient presents with  . Congestive Heart Failure  . Edema   Subjective:     HPI  Follow up for CHF & Edema:  The patient had labs done on 03/28/2020. Changes made at last visit include patient advised to continue Lasix and increase Lasix to two tablets when legs are swollen. He has since had several visits to his cardiologist Dr. Caryl Comes and now taking Torsemide 40 mg in AM & 20 mg PM.  He reports good compliance with treatment. He had normal met B on 05/23/2019 He feels that condition is Improved. He is not having side effects.   ------------------------------------------------------------------------------------  He also reports he gets a burning pain in his LUQ after eating on occasion. No nausea or vomiting.   He also reports the swelling of left elbow has come back which he finds bothersome and would like to have drained again.   No Known Allergies   Current Outpatient Medications:  .  acetaminophen (TYLENOL) 325 MG tablet, Take 650 mg by mouth 3 (three) times daily., Disp: , Rfl:  .  alendronate (FOSAMAX) 70 MG tablet, Take 70 mg by mouth once a week. Take with a full glass of water on an empty stomach on Saturdays., Disp: , Rfl:  .  amitriptyline (ELAVIL) 25 MG tablet, Take 25 mg by mouth at bedtime., Disp: , Rfl:  .  apixaban (ELIQUIS) 5 MG TABS tablet, Take 5 mg by mouth 2 (two) times daily., Disp: , Rfl:  .  Ascorbic Acid (VITAMIN C) 1000 MG tablet, Take 1,000 mg by mouth daily., Disp: , Rfl:  .  Calcium Carbonate-Vitamin D (CALCIUM 500 + D PO), Take 1 tablet by mouth daily., Disp: , Rfl:  .  cholecalciferol (VITAMIN D3) 25 MCG (1000 UT) tablet, Take 2,000 Units by mouth daily. , Disp: , Rfl:  .  ferrous sulfate 325 (65 FE) MG EC tablet, Take 325 mg by mouth daily with breakfast., Disp: , Rfl:  .  gabapentin  (NEURONTIN) 100 MG capsule, TAKE 1 CAPSULE IN THE MORNING AND TWO AT NIGHT, Disp: 90 capsule, Rfl: 5 .  Multiple Vitamins-Minerals (MULTIVITAMIN ADULT PO), Take 1 tablet by mouth daily., Disp: , Rfl:  .  Multiple Vitamins-Minerals (ZINC PO), Take by mouth daily., Disp: , Rfl:  .  Polyethylene Glycol 3350 (MIRALAX PO), Take 1 Dose by mouth daily. Heaping teaspoon, Disp: , Rfl:  .  potassium chloride (K-DUR) 10 MEQ tablet, TAKE 1 TABLET BY MOUTH DAILY (Patient taking differently: Take 10 mEq by mouth daily. ), Disp: 30 tablet, Rfl: 11 .  Probiotic CAPS, Take 1 capsule by mouth daily., Disp: , Rfl:  .  Psyllium (METAMUCIL PO), Take 1 Dose by mouth daily., Disp: , Rfl:  .  pyridOXINE (VITAMIN B-6) 100 MG tablet, Take 100 mg by mouth daily., Disp: , Rfl:  .  tamsulosin (FLOMAX) 0.4 MG CAPS capsule, Take 0.4 mg by mouth daily after breakfast. , Disp: , Rfl:  .  torsemide (DEMADEX) 20 MG tablet, Take 40 mg (2 tablets) in the am and 20 mg (1 tablet) in the pm, Disp: 90 tablet, Rfl: 3 .  Vitamin E 400 units TABS, Take 1 tablet by mouth daily., Disp: , Rfl:   Review of Systems  Constitutional: Negative.   Respiratory: Negative.  Cardiovascular: Negative.   Musculoskeletal: Negative.     Social History   Tobacco Use  . Smoking status: Never Smoker  . Smokeless tobacco: Never Used  Substance Use Topics  . Alcohol use: Yes    Alcohol/week: 0.0 standard drinks    Comment: drinks 2 beers a month      Objective:   BP 107/67 (BP Location: Right Arm, Patient Position: Sitting, Cuff Size: Normal)   Pulse 74   Temp (!) 96.6 F (35.9 C) (Temporal)   Wt 185 lb 6.4 oz (84.1 kg)   SpO2 90%   BMI 29.92 kg/m  Vitals:   06/08/19 1510  BP: 107/67  Pulse: 74  Temp: (!) 96.6 F (35.9 C)  TempSrc: Temporal  SpO2: 90%  Weight: 185 lb 6.4 oz (84.1 kg)  Body mass index is 29.92 kg/m.   Physical Exam   General: Appearance:     Well developed, well nourished male in no acute distress  Eyes:     PERRL, conjunctiva/corneas clear, EOM's intact       Lungs:     Clear to auscultation bilaterally, respirations unlabored  Heart:    Normal heart rate. Irregularly irregular rhythm. No murmurs, rubs, or gallops.   MS:   All extremities are intact. Plumb sized fluctuate mass over left olecranon.   Neurologic:   Awake, alert, oriented x 3. No apparent focal neurological           defect.            Assessment & Plan    1. Edema, unspecified type Improved on current diuretics prescribed per Dr. Caryl Comes with stable kidney functions and electrolytes.   2. Olecranon bursitis of left elbow Prepped with isopropyl alcohol and aspirated 8 cc serosanguinous fluid, tolerated well by patient.   3. Gastritis without bleeding, unspecified chronicity, unspecified gastritis type  - famotidine (PEPCID) 20 MG tablet; Take 1 tablet (20 mg total) by mouth 2 (two) times daily. For gastritis  Dispense: 60 tablet; Refill: 2     Lelon Huh, MD  [facility]

## 2019-06-19 ENCOUNTER — Telehealth (HOSPITAL_COMMUNITY): Payer: Self-pay

## 2019-06-19 NOTE — Telephone Encounter (Signed)
Weight today 183 lbs.   Today had a telephone visit with [name].  He states been feeling pretty good lately.  He denies any fluid problems, he has lost a couple of pounds.  He states appetite is good.  His neighbor fixes his medications for him in his box and takes him to his appts.  He is aware of what he takes and for what.  He lives alone in a retirement community.  They fix him 2 meals a day.  He eats some of the food, not a big eater.  He watches his salt and fluids.  He weighs daily.  He has all his medications, verified them not long ago with his neighbor Electrical engineer.  He denies cough, chest pain, headaches or dizziness.  Denies shortness of breath.  He walks with a rollator.  He has to walk daily to pick his meals up, he also picks up his neighbor and delivers hers.  His mood is good.  Will continue to visit for heart failure.  He is aware and his neighbor Fraser Din if weight gain to call me.    [place] [phone]

## 2019-07-10 ENCOUNTER — Telehealth (HOSPITAL_COMMUNITY): Payer: Self-pay

## 2019-07-10 NOTE — Telephone Encounter (Signed)
Today had a telephone visit with Given.  He states been doing well, his weight staying same at 183 lbs.  His appetite is good, he drinks insure for supplement, he is glad he was able to get chocolate this time.  His neighbor is still placing his meds in his med box for him, she will be moving but will come to visit to do his meds and take him to appts.  [facility] coming and [facility] to [facility] on him also.  He got a bedrail to assist him standing up from the bed.  He has everything for everyday living.  He denies chest pain, headaches, dizziness or shortness of breath.  He stays active and ambulates with rollator.  He has to walk to the cafeteria to get his meals everyday.  Will continue to visit for heart failure.   [place] [phone]

## 2019-07-11 ENCOUNTER — Ambulatory Visit: Payer: Medicare Other | Admitting: Urology

## 2019-08-06 ENCOUNTER — Other Ambulatory Visit: Payer: Self-pay | Admitting: Family Medicine

## 2019-08-06 DIAGNOSIS — K2970 Gastritis, unspecified, without bleeding: Secondary | ICD-10-CM

## 2019-08-07 ENCOUNTER — Telehealth (HOSPITAL_COMMUNITY): Payer: Self-pay

## 2019-08-07 NOTE — Telephone Encounter (Signed)
Had a telephone appt with Douglas Calhoun today.  He states weight is 183.7 lbs today.  He has not gained any since last visit.  He states feeling good, not hurting.  He denies chest pain, shortness of breath, headaches or dizziness.  He walks to the dining room for meals now, they opened it back up.  He gets around by rollator but pretty steady.  Stays active inside and walking.  His medications are placed in his med box by his friend, she takes him to his appts and helps with his grocery shopping.  He is aware of how to take his medications and aware of what he takes.  Verified his medications.  He states appetite is good and watches high sodium foods.  He does not eat large portions at a sitting but eats small meals throughout the day.  He denies any edema in legs or abdomen feeling tight.  Will continue to visit for heart failure.   [place] [phone]

## 2019-08-08 NOTE — Progress Notes (Signed)
Subjective:   Douglas Calhoun is a 86 y.o. male who presents for an Initial Medicare Annual Wellness Visit.    This visit is being conducted through telemedicine due to the COVID-19 pandemic. This patient has given me verbal consent via doximity to conduct this visit, patient states they are participating from their home address. Some vital signs may be absent or patient reported.    Patient identification: identified by name, DOB, and current address  Review of Systems  N/A  Cardiac Risk Factors include: male gender;advanced age (>47men, >70 women)    Objective:    Today's Vitals   08/09/19 1059  PainSc: 0-No pain   There is no height or weight on file to calculate BMI. Unable to obtain vitals due to visit being conducted via telephonically.   Advanced Directives 08/09/2019 01/11/2019 03/30/2018 03/29/2018 03/29/2018 10/24/2015 11/19/2014  Does Patient Have a Medical Advance Directive? Yes Yes Yes Yes No No Yes  Type of Paramedic of [place];Living will Healthcare Power of Attorney Living will Living will - - [place];Living will  Does patient want to make changes to medical advance directive? - No - Patient declined No - Patient declined - - - No - Patient declined  Copy of [place] in Chart? No - copy requested No - copy requested - - - - No - copy requested  Would patient like information on creating a medical advance directive? - - - - - No - patient declined information -    Current Medications (verified) Outpatient Encounter Medications as of 08/09/2019  Medication Sig  . acetaminophen (TYLENOL) 325 MG tablet Take 650 mg by mouth 3 (three) times daily.  Marland Kitchen alendronate (FOSAMAX) 70 MG tablet Take 70 mg by mouth once a week. Take with a full glass of water on an empty stomach on Saturdays.  Marland Kitchen amitriptyline (ELAVIL) 25 MG tablet Take 25 mg by mouth at bedtime.  Marland Kitchen apixaban (ELIQUIS) 5 MG TABS tablet Take 5 mg by mouth  2 (two) times daily.  . Ascorbic Acid (VITAMIN C) 1000 MG tablet Take 1,000 mg by mouth daily.  . Calcium Carbonate-Vitamin D (CALCIUM 500 + D PO) Take 1 tablet by mouth daily.  . cholecalciferol (VITAMIN D3) 25 MCG (1000 UT) tablet Take 2,000 Units by mouth daily.   . famotidine (PEPCID) 20 MG tablet TAKE 1 TABLET BY MOUTH TWICE DAILY FOR GASTRITIS  . ferrous sulfate 325 (65 FE) MG EC tablet Take 325 mg by mouth daily with breakfast.  . gabapentin (NEURONTIN) 100 MG capsule TAKE 1 CAPSULE IN THE MORNING AND TWO AT NIGHT  . Multiple Vitamins-Minerals (MULTIVITAMIN ADULT PO) Take 1 tablet by mouth daily.  . Multiple Vitamins-Minerals (ZINC PO) Take by mouth daily.  . Polyethylene Glycol 3350 (MIRALAX PO) Take 1 Dose by mouth daily. Heaping teaspoon  . potassium chloride (K-DUR) 10 MEQ tablet TAKE 1 TABLET BY MOUTH DAILY (Patient taking differently: Take 10 mEq by mouth daily. )  . Probiotic CAPS Take 1 capsule by mouth daily.  . Psyllium (METAMUCIL PO) Take 1 Dose by mouth daily.  Marland Kitchen pyridOXINE (VITAMIN B-6) 100 MG tablet Take 100 mg by mouth daily.  . tamsulosin (FLOMAX) 0.4 MG CAPS capsule Take 0.4 mg by mouth daily after breakfast.   . torsemide (DEMADEX) 20 MG tablet Take 40 mg (2 tablets) in the am and 20 mg (1 tablet) in the pm  . Vitamin E 400 units TABS Take 1 tablet by  mouth daily.   No facility-administered encounter medications on file as of 08/09/2019.    Allergies (verified) Patient has no known allergies.   History: Past Medical History:  Diagnosis Date  . Achilles tendinitis   . Arrhythmia    atrial fibrillation  . Cataract   . CHF (congestive heart failure) ([place])   . Hearing loss   . Herpes zoster without complication [id number]  . Personal history of prostate cancer 10/08/2014   s/p 15 radiation treatments treated by Dr. Madelin Headings   . Varicose vein of leg    Past Surgical History:  Procedure Laterality Date  . APPENDECTOMY  1960's  . BREAST SURGERY     Breast Biopsy  prior to 1960  . CATARACT EXTRACTION Left   . PACEMAKER LEADLESS INSERTION N/A 01/11/2019   Procedure: PACEMAKER LEADLESS INSERTION;  Surgeon: Isaias Cowman, MD;  Location: [facility];  Service: Cardiovascular;  Laterality: N/A;   Family History  Problem Relation Age of Onset  . Heart disease Mother   . Stroke Mother   . Alcoholism Son    Social History   Socioeconomic History  . Marital status: Legally Separated    Spouse name: Not on file  . Number of children: 1  . Years of education: Not on file  . Highest education level: Bachelor's degree (e.g., BA, AB, BS)  Occupational History  . Occupation: retired  Tobacco Use  . Smoking status: Never Smoker  . Smokeless tobacco: Never Used  Substance and Sexual Activity  . Alcohol use: Not Currently    Alcohol/week: 0.0 standard drinks  . Drug use: No  . Sexual activity: Not on file  Other Topics Concern  . Not on file  Social History Narrative   Lives along at [facility]   Financial Resource Strain: Low Risk   . Difficulty of Paying Living Expenses: Not hard at all  Food Insecurity: No Food Insecurity  . Worried About Charity fundraiser in the Last Year: Never true  . Ran Out of Food in the Last Year: Never true  Transportation Needs: No Transportation Needs  . Lack of Transportation (Medical): No  . Lack of Transportation (Non-Medical): No  Physical Activity: Inactive  . Days of Exercise per Week: 0 days  . Minutes of Exercise per Session: 0 min  Stress: No Stress Concern Present  . Feeling of Stress : Not at all  Social Connections: Moderately Isolated  . Frequency of Communication with Friends and Family: Once a week  . Frequency of Social Gatherings with Friends and Family: More than three times a week  . Attends Religious Services: Never  . Active Member of Clubs or Organizations: No  . Attends Archivist Meetings: Never  . Marital Status: Separated    Tobacco Counseling Counseling given: Not Answered   Clinical Intake:  Pre-visit preparation completed: Yes  Pain : No/denies pain Pain Score: 0-No pain     Nutritional Risks: None Diabetes: No  How often do you need to have someone help you when you read instructions, pamphlets, or other written materials from your doctor or pharmacy?: 1 - Never  Interpreter Needed?: No  Information entered by :: [facility], LPN  Activities of Daily Living In your present state of health, do you have any difficulty performing the following activities: 08/09/2019 01/11/2019  Hearing? Y -  Comment Wears bilateral hearing aids. -  Vision? N -  Difficulty concentrating or making decisions? Y -  Walking  or climbing stairs? Y -  Comment Due to SOB and balance issues. -  Dressing or bathing? N -  Doing errands, shopping? Y N  Comment Does not drive. -  Preparing Food and eating ? N -  Using the Toilet? N -  In the past six months, have you accidently leaked urine? Y -  Comment Occasionally with urges. Uses a catheter nightly before bed. -  Do you have problems with loss of bowel control? N -  Managing your Medications? Y -  Comment Nurse manages medications. -  Managing your Finances? N -  Housekeeping or managing your Housekeeping? Y -  Comment Has a cleaning aid at retirement home. -  Some recent data might be hidden     Immunizations and Health Maintenance Immunization History  Administered Date(s) Administered  . Influenza, High Dose Seasonal PF 04/12/2018  . Influenza,inj,Quad PF,6+ Mos 03/06/2013  . Influenza-Unspecified 02/07/2019  . Pneumococcal Conjugate-13 03/06/2013  . Tdap 02/29/2012  . Zoster 03/26/2010   Health Maintenance Due  Topic Date Due  . PNA vac Low Risk Adult (2 of 2 - PPSV23) 03/06/2014    Patient Care Team: Birdie Sons, MD as PCP - General (Family Medicine) Kate Sable, MD as PCP - Cardiology (Cardiology) Billey Co, MD as Consulting  Physician (Urology)  Indicate any recent Medical Services you may have received from other than [facility] providers in the past year (date may be approximate).    Assessment:   This is a routine wellness examination for Olufemi.  Hearing/Vision screen No exam data present  Dietary issues and exercise activities discussed: Current Exercise Habits: The patient does not participate in regular exercise at present, Exercise limited by: orthopedic condition(s)  Goals    . DIET - INCREASE WATER INTAKE     Recommend to drink at least 6-8 8oz glasses of water per day.      Depression Screen PHQ 2/9 Scores 08/09/2019 01/26/2019 08/14/2018 08/04/2016  PHQ - 2 Score 0 0 0 0  PHQ- 9 Score - - - 2    Fall Risk Fall Risk  08/09/2019 06/08/2019 05/26/2018 04/14/2018 08/04/2016  Falls in the past year? 0 1 0 1 No  Number falls in past yr: 0 1 0 0 -  Comment - - - - -  Injury with Fall? 0 1 0 0 -  Comment - - - - -    FALL RISK PREVENTION PERTAINING TO THE HOME:  Any stairs in or around the home? No  If so, are there any without handrails? N/A  Home free of loose throw rugs in walkways, pet beds, electrical cords, etc? Yes  Adequate lighting in your home to reduce risk of falls? Yes   ASSISTIVE DEVICES UTILIZED TO PREVENT FALLS:  Life alert? Yes Use of a cane, walker or w/c? Yes  Grab bars in the bathroom? Yes  Shower chair or bench in shower? Yes  Elevated toilet seat or a handicapped toilet? No    TIMED UP AND GO:  Was the test performed? No .    Cognitive Function: Declined today.         Screening Tests Health Maintenance  Topic Date Due  . PNA vac Low Risk Adult (2 of 2 - PPSV23) 03/06/2014  . INFLUENZA VACCINE  11/25/2019  . TETANUS/TDAP  02/28/2022    Qualifies for Shingles Vaccine? Yes  Zostavax completed 03/26/10. Due for Shingrix. Pt has been advised to call insurance company to determine out of pocket expense.  Advised may also receive vaccine at local pharmacy or Health  Dept. Verbalized acceptance and understanding.  Tdap: Up to date  Flu Vaccine: Up to date  Pneumococcal Vaccine: Due for Pneumococcal vaccine. Does the patient want to receive this vaccine today?  No . Advised may receive this vaccine at local pharmacy or Health Dept. Aware to provide a copy of the vaccination record if obtained from local pharmacy or Health Dept. Verbalized acceptance and understanding.   Cancer Screenings:  Colorectal Screening: No longer required.   Lung Cancer Screening: (Low Dose CT Chest recommended if Age 48-80 years, 30 pack-year currently smoking OR have quit w/in 15years.) does not qualify.   Additional Screening:  Vision Screening: Recommended annual ophthalmology exams for early detection of glaucoma and other disorders of the eye.  Dental Screening: Recommended annual dental exams for proper oral hygiene  Community Resource Referral:  CRR required this visit?  No        Plan:  I have personally reviewed and addressed the Medicare Annual Wellness questionnaire and have noted the following in the patient's chart:  A. Medical and social history B. Use of alcohol, tobacco or illicit drugs  C. Current medications and supplements D. Functional ability and status E.  Nutritional status F.  Physical activity G. Advance directives H. List of other physicians I.  Hospitalizations, surgeries, and ER visits in previous 12 months J.  [place] such as hearing and vision if needed, cognitive and depression L. Referrals and appointments   In addition, I have reviewed and discussed with patient certain preventive protocols, quality metrics, and best practice recommendations. A written personalized care plan for preventive services as well as general preventive health recommendations were provided to patient.   [place] Score, [place]   [id number]  Nurse Health Advisor   Nurse Notes: Pt needs a Pneumovax 23 vaccine at next in office  apt.

## 2019-08-09 ENCOUNTER — Ambulatory Visit (INDEPENDENT_AMBULATORY_CARE_PROVIDER_SITE_OTHER): Payer: Medicare Other

## 2019-08-09 ENCOUNTER — Telehealth: Payer: Self-pay

## 2019-08-09 ENCOUNTER — Other Ambulatory Visit: Payer: Self-pay

## 2019-08-09 DIAGNOSIS — Z0000 Encounter for general adult medical examination without abnormal findings: Secondary | ICD-10-CM | POA: Diagnosis not present

## 2019-08-09 NOTE — Patient Instructions (Addendum)
Mr. Douglas Calhoun , Thank you for taking time to come for your Medicare Wellness Visit. I appreciate your ongoing commitment to your health goals. Please review the following plan we discussed and let me know if I can assist you in the future.   Screening recommendations/referrals: Colonoscopy: No longer required.  Recommended yearly ophthalmology/optometry visit for glaucoma screening and checkup Recommended yearly dental visit for hygiene and checkup  Vaccinations: Influenza vaccine: Up to date Pneumococcal vaccine: Pneumovax 23 due Tdap vaccine: Up to date Shingles vaccine: Pt declines today.     Advanced directives: Please bring a copy of your POA (Power of Attorney) and/or Living Will to your next appointment.   Conditions/risks identified: Recommend to drink at least 6-8 8oz glasses of water per day.  Next appointment: None. Pt declined scheduling a follow up with PCP or an AWV for 2022 at this time.   Preventive Care 9 Years and Older, Male Preventive care refers to lifestyle choices and visits with your health care provider that can promote health and wellness. What does preventive care include?  A yearly physical exam. This is also called an annual well check.  Dental exams once or twice a year.  Routine eye exams. Ask your health care provider how often you should have your eyes checked.  Personal lifestyle choices, including:  Daily care of your teeth and gums.  Regular physical activity.  Eating a healthy diet.  Avoiding tobacco and drug use.  Limiting alcohol use.  Practicing safe sex.  Taking low doses of aspirin every day.  Taking vitamin and mineral supplements as recommended by your health care provider. What happens during an annual well check? The services and screenings done by your health care provider during your annual well check will depend on your age, overall health, lifestyle risk factors, and family history of disease. Counseling  Your health care  provider may ask you questions about your:  Alcohol use.  Tobacco use.  Drug use.  Emotional well-being.  Home and relationship well-being.  Sexual activity.  Eating habits.  History of falls.  Memory and ability to understand (cognition).  Work and work Statistician. Screening  You may have the following tests or measurements:  Height, weight, and BMI.  Blood pressure.  Lipid and cholesterol levels. These may be checked every 5 years, or more frequently if you are over 57 years old.  Skin check.  Lung cancer screening. You may have this screening every year starting at age 39 if you have a 30-pack-year history of smoking and currently smoke or have quit within the past 15 years.  Fecal occult blood test (FOBT) of the stool. You may have this test every year starting at age 47.  Flexible sigmoidoscopy or colonoscopy. You may have a sigmoidoscopy every 5 years or a colonoscopy every 10 years starting at age 64.  Prostate cancer screening. Recommendations will vary depending on your family history and other risks.  Hepatitis C blood test.  Hepatitis B blood test.  Sexually transmitted disease (STD) testing.  Diabetes screening. This is done by checking your blood sugar (glucose) after you have not eaten for a while (fasting). You may have this done every 1-3 years.  Abdominal aortic aneurysm (AAA) screening. You may need this if you are a current or former smoker.  Osteoporosis. You may be screened starting at age 70 if you are at high risk. Talk with your health care provider about your test results, treatment options, and if necessary, the need for more  tests. Vaccines  Your health care provider may recommend certain vaccines, such as:  Influenza vaccine. This is recommended every year.  Tetanus, diphtheria, and acellular pertussis (Tdap, Td) vaccine. You may need a Td booster every 10 years.  Zoster vaccine. You may need this after age 66.  Pneumococcal  13-valent conjugate (PCV13) vaccine. One dose is recommended after age 63.  Pneumococcal polysaccharide (PPSV23) vaccine. One dose is recommended after age 39. Talk to your health care provider about which screenings and vaccines you need and how often you need them. This information is not intended to replace advice given to you by your health care provider. Make sure you discuss any questions you have with your health care provider. Document Released: 05/09/2015 Document Revised: 12/31/2015 Document Reviewed: 02/11/2015 Elsevier Interactive Patient Education  2017 [place] Prevention in the Home Falls can cause injuries. They can happen to people of all ages. There are many things you can do to make your home safe and to help prevent falls. What can I do on the outside of my home?  Regularly fix the edges of walkways and driveways and fix any cracks.  Remove anything that might make you trip as you walk through a door, such as a raised step or threshold.  Trim any bushes or trees on the path to your home.  Use bright outdoor lighting.  Clear any walking paths of anything that might make someone trip, such as rocks or tools.  Regularly check to see if handrails are loose or broken. Make sure that both sides of any steps have handrails.  Any raised decks and porches should have guardrails on the edges.  Have any leaves, snow, or ice cleared regularly.  Use sand or salt on walking paths during winter.  Clean up any spills in your garage right away. This includes oil or grease spills. What can I do in the bathroom?  Use night lights.  Install grab bars by the toilet and in the tub and shower. Do not use towel bars as grab bars.  Use non-skid mats or decals in the tub or shower.  If you need to sit down in the shower, use a plastic, non-slip stool.  Keep the floor dry. Clean up any water that spills on the floor as soon as it happens.  Remove soap buildup in the  tub or shower regularly.  Attach bath mats securely with double-sided non-slip rug tape.  Do not have throw rugs and other things on the floor that can make you trip. What can I do in the bedroom?  Use night lights.  Make sure that you have a light by your bed that is easy to reach.  Do not use any sheets or blankets that are too big for your bed. They should not hang down onto the floor.  Have a firm chair that has side arms. You can use this for support while you get dressed.  Do not have throw rugs and other things on the floor that can make you trip. What can I do in the kitchen?  Clean up any spills right away.  Avoid walking on wet floors.  Keep items that you use a lot in easy-to-reach places.  If you need to reach something above you, use a strong step stool that has a grab bar.  Keep electrical cords out of the way.  Do not use floor polish or wax that makes floors slippery. If you must use wax, use non-skid floor  wax.  Do not have throw rugs and other things on the floor that can make you trip. What can I do with my stairs?  Do not leave any items on the stairs.  Make sure that there are handrails on both sides of the stairs and use them. Fix handrails that are broken or loose. Make sure that handrails are as long as the stairways.  Check any carpeting to make sure that it is firmly attached to the stairs. Fix any carpet that is loose or worn.  Avoid having throw rugs at the top or bottom of the stairs. If you do have throw rugs, attach them to the floor with carpet tape.  Make sure that you have a light switch at the top of the stairs and the bottom of the stairs. If you do not have them, ask someone to add them for you. What else can I do to help prevent falls?  Wear shoes that:  Do not have high heels.  Have rubber bottoms.  Are comfortable and fit you well.  Are closed at the toe. Do not wear sandals.  If you use a stepladder:  Make sure that it  is fully opened. Do not climb a closed stepladder.  Make sure that both sides of the stepladder are locked into place.  Ask someone to hold it for you, if possible.  Clearly mark and make sure that you can see:  Any grab bars or handrails.  First and last steps.  Where the edge of each step is.  Use tools that help you move around (mobility aids) if they are needed. These include:  Canes.  Walkers.  Scooters.  Crutches.  Turn on the lights when you go into a dark area. Replace any light bulbs as soon as they burn out.  Set up your furniture so you have a clear path. Avoid moving your furniture around.  If any of your floors are uneven, fix them.  If there are any pets around you, be aware of where they are.  Review your medicines with your doctor. Some medicines can make you feel dizzy. This can increase your chance of falling. Ask your doctor what other things that you can do to help prevent falls. This information is not intended to replace advice given to you by your health care provider. Make sure you discuss any questions you have with your health care provider. Document Released: 02/06/2009 Document Revised: 09/18/2015 Document Reviewed: 05/17/2014 Elsevier Interactive Patient Education  2017 [employer].

## 2019-08-09 NOTE — Telephone Encounter (Signed)
Telephonic AWV completed with pt today.

## 2019-08-09 NOTE — Telephone Encounter (Signed)
Copied from [place] [id number]. Topic: General - Other >> Aug 09, 2019  8:52 AM Celene Kras wrote: Reason for CRM: Pts friend called and is requesting to reschedule the AWV for today. She states that she called yesterday, but had phone issues. Please advise.

## 2019-08-24 ENCOUNTER — Ambulatory Visit: Payer: Medicare Other | Admitting: Cardiology

## 2019-08-27 ENCOUNTER — Ambulatory Visit (INDEPENDENT_AMBULATORY_CARE_PROVIDER_SITE_OTHER): Payer: Medicare Other | Admitting: Cardiology

## 2019-08-27 ENCOUNTER — Other Ambulatory Visit: Payer: Self-pay

## 2019-08-27 ENCOUNTER — Encounter: Payer: Self-pay | Admitting: Cardiology

## 2019-08-27 VITALS — BP 120/72 | HR 78 | Ht 66.0 in | Wt 189.4 lb

## 2019-08-27 DIAGNOSIS — I495 Sick sinus syndrome: Secondary | ICD-10-CM | POA: Diagnosis not present

## 2019-08-27 DIAGNOSIS — I4819 Other persistent atrial fibrillation: Secondary | ICD-10-CM

## 2019-08-27 DIAGNOSIS — I5022 Chronic systolic (congestive) heart failure: Secondary | ICD-10-CM | POA: Diagnosis not present

## 2019-08-27 NOTE — Patient Instructions (Signed)
Medication Instructions:  Your physician recommends that you continue on your current medications as directed. Please refer to the Current Medication list given to you today.  *If you need a refill on your cardiac medications before your next appointment, please call your pharmacy*   Lab Work: None ordered If you have labs (blood work) drawn today and your tests are completely normal, you will receive your results only by: . MyChart Message (if you have MyChart) OR . A paper copy in the mail If you have any lab test that is abnormal or we need to change your treatment, we will call you to review the results.   Testing/Procedures: None ordered   Follow-Up: At [facility], you and your health needs are our priority.  As part of our continuing mission to provide you with exceptional heart care, we have created designated Provider Care Teams.  These Care Teams include your primary Cardiologist (physician) and Advanced Practice Providers (APPs -  Physician Assistants and Nurse Practitioners) who all work together to provide you with the care you need, when you need it.  We recommend signing up for the patient portal called "MyChart".  Sign up information is provided on this After Visit Summary.  MyChart is used to connect with patients for Virtual Visits (Telemedicine).  Patients are able to view lab/test results, encounter notes, upcoming appointments, etc.  Non-urgent messages can be sent to your provider as well.   To learn more about what you can do with MyChart, go to [email or web address].    Your next appointment:   6 month(s)  The format for your next appointment:   In Person  Provider:    You may see Brian Agbor-Etang, MD or one of the following Advanced Practice Providers on your designated Care Team:    Christopher Berge, NP  [name] Campoy, PA-C  Jacquelyn Visser, PA-C    Other Instructions N/A  

## 2019-08-27 NOTE — Progress Notes (Signed)
Cardiology Office Note:    Date:  08/27/2019   ID:  Douglas Calhoun, DOB August 23, 1919, MRN [id number]  PCP:  Birdie Sons, MD  Cardiologist:  Kate Sable, MD  Electrophysiologist:  None   Referring MD: Birdie Sons, MD   Chief Complaint  Patient presents with  . office visit    3 month F/U; Meds verbally reviewed with patient.    History of Present Illness:    Douglas Calhoun is a 86 y.o. male with a hx of atrial fibrillation on Eliquis, sick sinus syndrome status post leadless pacemaker 01/11/2019,  heart failure reduced ejection fraction, last EF 25% on 02/05/2019, who presents for follow-up.  He is being seen for congestive heart failure and lower extremity edema.  He is diuretic/torsemide increased to 40 mg in the morning and 20 mg in the evening.  Further addition of guideline directed heart failure treatment has been limited due to low blood pressures.  Patient states feeling fine at the current dose of diuretics.  Walks around his home with a cane, has no issues or new concerns at this time.  His weight measurements at home range from 183 to 187 pounds  Historical notes Patient was diagnosed with lumbar compression fracture in November 2020.  He was admitted at [facility].  Surgery was not performed due to his age and medical management recommended.  He was noted to be fluid overloaded at the time and was adequately diuresed.  On discharge, his weight was 180 pounds.   He has urinary obstruction and self caths themselves.  He denies any history of heart attacks or CAD.  Last echocardiogram date 02/05/2019 revealed severely reduced ejection fraction with EF 25%.  Moderately dilated left ventricle, moderately dilated right ventricle.   Past Medical History:  Diagnosis Date  . Achilles tendinitis   . Arrhythmia    atrial fibrillation  . Cataract   . CHF (congestive heart failure) ([place])   . Hearing loss   . Herpes zoster without complication [id number]  . Personal history of  prostate cancer 10/08/2014   s/p 15 radiation treatments treated by Dr. Madelin Headings   . Varicose vein of leg     Past Surgical History:  Procedure Laterality Date  . APPENDECTOMY  1960's  . BREAST SURGERY     Breast Biopsy prior to 1960  . CATARACT EXTRACTION Left   . PACEMAKER LEADLESS INSERTION N/A 01/11/2019   Procedure: PACEMAKER LEADLESS INSERTION;  Surgeon: Isaias Cowman, MD;  Location: [place] [facility];  Service: Cardiovascular;  Laterality: N/A;    Current Medications: Current Meds  Medication Sig  . acetaminophen (TYLENOL) 325 MG tablet Take 650 mg by mouth 3 (three) times daily.  Marland Kitchen alendronate (FOSAMAX) 70 MG tablet Take 70 mg by mouth once a week. Take with a full glass of water on an empty stomach on Saturdays.  Marland Kitchen amitriptyline (ELAVIL) 25 MG tablet Take 25 mg by mouth at bedtime.  Marland Kitchen apixaban (ELIQUIS) 5 MG TABS tablet Take 5 mg by mouth 2 (two) times daily.  . Ascorbic Acid (VITAMIN C) 1000 MG tablet Take 1,000 mg by mouth daily.  . Calcium Carbonate-Vitamin D (CALCIUM 500 + D PO) Take 1 tablet by mouth daily.  . cholecalciferol (VITAMIN D3) 25 MCG (1000 UT) tablet Take 2,000 Units by mouth daily.   . famotidine (PEPCID) 20 MG tablet TAKE 1 TABLET BY MOUTH TWICE DAILY FOR GASTRITIS  . ferrous sulfate 325 (65 FE) MG EC tablet Take 325 mg by  mouth daily with breakfast.  . gabapentin (NEURONTIN) 100 MG capsule TAKE 1 CAPSULE IN THE MORNING AND TWO AT NIGHT  . Multiple Vitamins-Minerals (MULTIVITAMIN ADULT PO) Take 1 tablet by mouth daily.  . Multiple Vitamins-Minerals (ZINC PO) Take by mouth daily.  . Polyethylene Glycol 3350 (MIRALAX PO) Take 1 Dose by mouth daily. Heaping teaspoon  . potassium chloride (K-DUR) 10 MEQ tablet TAKE 1 TABLET BY MOUTH DAILY (Patient taking differently: Take 10 mEq by mouth daily. )  . Probiotic CAPS Take 1 capsule by mouth daily.  . Psyllium (METAMUCIL PO) Take 1 Dose by mouth daily.  Marland Kitchen pyridOXINE (VITAMIN B-6) 100 MG tablet Take 100 mg  by mouth daily.  . tamsulosin (FLOMAX) 0.4 MG CAPS capsule Take 0.4 mg by mouth daily after breakfast.   . torsemide (DEMADEX) 20 MG tablet Take 40 mg (2 tablets) in the am and 20 mg (1 tablet) in the pm  . Vitamin E 400 units TABS Take 1 tablet by mouth daily.     Allergies:   Patient has no known allergies.   Social History   Socioeconomic History  . Marital status: Legally Separated    Spouse name: Not on file  . Number of children: 1  . Years of education: Not on file  . Highest education level: Bachelor's degree (e.g., BA, AB, BS)  Occupational History  . Occupation: retired  Tobacco Use  . Smoking status: Never Smoker  . Smokeless tobacco: Never Used  Substance and Sexual Activity  . Alcohol use: Not Currently    Alcohol/week: 0.0 standard drinks  . Drug use: No  . Sexual activity: Not on file  Other Topics Concern  . Not on file  Social History Narrative   Lives along at [place] Strain: Low Risk   . Difficulty of Paying Living Expenses: Not hard at all  Food Insecurity: No Food Insecurity  . Worried About Charity fundraiser in the Last Year: Never true  . Ran Out of Food in the Last Year: Never true  Transportation Needs: No Transportation Needs  . Lack of Transportation (Medical): No  . Lack of Transportation (Non-Medical): No  Physical Activity: Inactive  . Days of Exercise per Week: 0 days  . Minutes of Exercise per Session: 0 min  Stress: No Stress Concern Present  . Feeling of Stress : Not at all  Social Connections: Moderately Isolated  . Frequency of Communication with Friends and Family: Once a week  . Frequency of Social Gatherings with Friends and Family: More than three times a week  . Attends Religious Services: Never  . Active Member of Clubs or Organizations: No  . Attends Archivist Meetings: Never  . Marital Status: Separated     Family History: The patient's family history  includes Alcoholism in his son; Heart disease in his mother; Stroke in his mother.  ROS:   Please see the history of present illness.     All other systems reviewed and are negative.  EKGs/Labs/Other Studies Reviewed:    The following studies were reviewed today: TTEchocardiogram date 02/05/2019 revealed severely reduced ejection fraction with EF 25%.   Moderately dilated left ventricle,  moderately dilated right ventricle. Moderate MR Moderate TR  EKG:  EKG was obtained today, EKG shows atrial fibrillation with occasional ventricular paced complexes.  Recent Labs: 03/29/2019: Hemoglobin 13.4; Magnesium 2.4; Platelets 247 05/23/2019: BUN 30; Creatinine, Ser 1.00; Potassium 4.1; Sodium 137  Recent Lipid Panel    Component Value Date/Time   CHOL 231 (A) 03/06/2013 0000   TRIG 98 03/06/2013 0000   HDL 78 (A) 03/06/2013 0000   LDLCALC 133 03/06/2013 0000    Physical Exam:    VS:  BP 120/72 (BP Location: Left Arm, Patient Position: Sitting, Cuff Size: Normal)   Pulse 78   Ht 5\' 6"  (1.676 m)   Wt 189 lb 6 oz (85.9 kg)   SpO2 93%   BMI 30.57 kg/m     Wt Readings from Last 3 Encounters:  08/27/19 189 lb 6 oz (85.9 kg)  06/08/19 185 lb 6.4 oz (84.1 kg)  05/25/19 186 lb (84.4 kg)     GEN:  Well nourished, well developed in mild distress due to back pain, frail looking HEENT: Normal NECK: No JVD; No carotid bruits LYMPHATICS: No lymphadenopathy CARDIAC: Irregular irregular, no murmurs, rubs, gallops RESPIRATORY:  Clear to auscultation without rales, wheezing or rhonchi  ABDOMEN: Soft, non-tender, non-distended MUSCULOSKELETAL: No edema; No deformity  SKIN: Warm and dry NEUROLOGIC:  Alert and oriented x 3 PSYCHIATRIC:  Normal affect    ASSESSMENT:    1. Chronic systolic heart failure (HCC)   2. Persistent atrial fibrillation ([place])   3. Sick sinus syndrome (HCC)    PLAN:    In order of problems listed above:  1. Patient with history of severely reduced EF, last EF  25%.  Patient's weight roughly 185 pounds at home. Holding off on heart failure medications in light of diuresing and low blood pressure.  Continue torsemide 40 mg in the morning and 20 nightly. 2. Persistent atrial fibrillation.  Heart rate controlled, continue Eliquis. 3. Sick sinus syndrome status post leadless pacemaker.  Keep appointment with EP/device clinic for frequent checks.  Follow-up in 6 months  This note was generated in part or whole with voice recognition software. Voice recognition is usually quite accurate but there are transcription errors that can and very often do occur. I apologize for any typographical errors that were not detected and corrected.  Medication Adjustments/Labs and Tests Ordered: Current medicines are reviewed at length with the patient today.  Concerns regarding medicines are outlined above.  Orders Placed This Encounter  Procedures  . EKG 12-Lead   No orders of the defined types were placed in this encounter.   Patient Instructions  Medication Instructions:  Your physician recommends that you continue on your current medications as directed. Please refer to the Current Medication list given to you today.  *If you need a refill on your cardiac medications before your next appointment, please call your pharmacy*   Lab Work: None ordered If you have labs (blood work) drawn today and your tests are completely normal, you will receive your results only by: Marland Kitchen MyChart Message (if you have MyChart) OR . A paper copy in the mail If you have any lab test that is abnormal or we need to change your treatment, we will call you to review the results.   Testing/Procedures: None ordered   Follow-Up: At [facility], you and your health needs are our priority.  As part of our continuing mission to provide you with exceptional heart care, we have created designated Provider Care Teams.  These Care Teams include your primary Cardiologist (physician) and  Advanced Practice Providers (APPs -  Physician Assistants and Nurse Practitioners) who all work together to provide you with the care you need, when you need it.  We recommend signing up for the patient  portal called "MyChart".  Sign up information is provided on this After Visit Summary.  MyChart is used to connect with patients for Virtual Visits (Telemedicine).  Patients are able to view lab/test results, encounter notes, upcoming appointments, etc.  Non-urgent messages can be sent to your provider as well.   To learn more about what you can do with MyChart, go to [email or web address].    Your next appointment:   6 month(s)  The format for your next appointment:   In Person  Provider:    You may see Kate Sable, MD or one of the following Advanced Practice Providers on your designated Care Team:    Murray Hodgkins, NP  [name], PA-C  Marrianne Mood, PA-C    Other Instructions N/A     Signed, Kate Sable, MD  08/27/2019 5:02 PM    [place]

## 2019-09-03 DIAGNOSIS — H53453 Other localized visual field defect, bilateral: Secondary | ICD-10-CM | POA: Diagnosis not present

## 2019-09-03 DIAGNOSIS — H2511 Age-related nuclear cataract, right eye: Secondary | ICD-10-CM | POA: Diagnosis not present

## 2019-09-03 DIAGNOSIS — Z961 Presence of intraocular lens: Secondary | ICD-10-CM | POA: Diagnosis not present

## 2019-09-03 DIAGNOSIS — H35363 Drusen (degenerative) of macula, bilateral: Secondary | ICD-10-CM | POA: Diagnosis not present

## 2019-09-03 DIAGNOSIS — H5203 Hypermetropia, bilateral: Secondary | ICD-10-CM | POA: Diagnosis not present

## 2019-09-11 ENCOUNTER — Telehealth (HOSPITAL_COMMUNITY): Payer: Self-pay

## 2019-09-11 NOTE — Telephone Encounter (Signed)
Discussed with patient for discharge from the program due to he has been stable.  He has someone that fills his med boxes for him and he is aware of how to take them.  He has been stable as far as Heart Failure.  He weighs daily.  His weight has been stable.  Tina with HF clinic also in agreement of discharging him.   [place] [phone]

## 2019-10-03 DIAGNOSIS — H35371 Puckering of macula, right eye: Secondary | ICD-10-CM | POA: Diagnosis not present

## 2019-10-04 ENCOUNTER — Ambulatory Visit: Payer: Medicare Other | Admitting: Urology

## 2019-12-12 ENCOUNTER — Telehealth: Payer: Self-pay | Admitting: Cardiology

## 2019-12-12 NOTE — Telephone Encounter (Signed)
Spoke with patients caregiver Fraser Din. She stated that over the last couple weeks the patient has been SOB and has had progressive LE edema. He normally takes 40mg  of Torsemide in the AM and 20mg  in the evening. Dr. Garen Lah had advised that if she felt he needed more, she could give him an extra dose in the afternoon. She gave him an extra dose for one whole week, last week and did not see a real difference. She did not have the list of daily weights with her but is sure the patient has gained 5 lbs in the last week. She is worried because lase year he was hospitalized for CHF and he lost 20 lbs of fluid by discharge.  She stated that he lives in independent living, and he does not eat much so that is not responsible for his weight gain.  Will route to Dr. Saunders Revel (DOD) for review, as Dr. Garen Lah is out of the office all week.   Progress note from Dr. Garen Lah 08/27/19  1. Patient with history of severely reduced EF, last EF 25%.  Patient's weight roughly 185 pounds at home. Holding off on heart failure medications in light of diuresing and low blood pressure.  Continue torsemide 40 mg in the morning and 20 nightly. 2. Persistent atrial fibrillation.  Heart rate controlled, continue Eliquis. 3. Sick sinus syndrome status post leadless pacemaker.  Keep appointment with EP/device clinic for frequent checks.

## 2019-12-12 NOTE — Telephone Encounter (Signed)
Given progressive weight gain and shortness of breath, I am concerned for acute on chronic HFrEF.  It sounds as though the patient is currently receiving torsemide 40 mg twice daily.  I suggest that this be increased to 60 mg twice daily and that the patient be seen in the office by an APP or the DOD later today or tomorrow.  If symptoms worsen significantly in the meantime, he should go to the ER.  Nelva Bush, MD [facility]

## 2019-12-12 NOTE — Telephone Encounter (Signed)
Spoke with patients caregiver Fraser Din and relayed Dr. Marisue Humble recommendation as noted below. Scheduled patient to be seen tomorrow at 10:05 with Murray Hodgkins.  Pat verbalized instructions and agreed with plan.

## 2019-12-12 NOTE — Telephone Encounter (Signed)
Pt c/o swelling: STAT is pt has developed SOB within 24 hours  1) How much weight have you gained and in what time span? 5 pounds in 1 week  2) If swelling, where is the swelling located? Just around ankles  3) Are you currently taking a fluid pill? yes  4) Are you currently SOB? yes 5) Do you have a log of your daily weights (if so, list)?  Does not have  6) Have you gained 3 pounds in a day or 5 pounds in a week? yes  Have you traveled recently? no

## 2019-12-13 ENCOUNTER — Other Ambulatory Visit
Admission: RE | Admit: 2019-12-13 | Discharge: 2019-12-13 | Disposition: A | Discharge status: Home / Self Care | Payer: Medicare Other | Source: Home / Self Care | Attending: Nurse Practitioner | Admitting: Nurse Practitioner

## 2019-12-13 ENCOUNTER — Encounter: Payer: Self-pay | Admitting: Nurse Practitioner

## 2019-12-13 ENCOUNTER — Ambulatory Visit (INDEPENDENT_AMBULATORY_CARE_PROVIDER_SITE_OTHER): Payer: Medicare Other | Admitting: Nurse Practitioner

## 2019-12-13 ENCOUNTER — Other Ambulatory Visit: Payer: Self-pay

## 2019-12-13 ENCOUNTER — Ambulatory Visit
Admission: RE | Admit: 2019-12-13 | Discharge: 2019-12-13 | Disposition: A | Discharge status: Home / Self Care | Payer: Medicare Other | Attending: Nurse Practitioner | Admitting: Nurse Practitioner

## 2019-12-13 ENCOUNTER — Ambulatory Visit
Admission: RE | Admit: 2019-12-13 | Discharge: 2019-12-13 | Disposition: A | Discharge status: Home / Self Care | Payer: Medicare Other | Source: Ambulatory Visit | Attending: Nurse Practitioner | Admitting: Nurse Practitioner

## 2019-12-13 VITALS — BP 104/68 | HR 52 | Ht 66.0 in | Wt 193.6 lb

## 2019-12-13 DIAGNOSIS — R0600 Dyspnea, unspecified: Secondary | ICD-10-CM

## 2019-12-13 DIAGNOSIS — I5023 Acute on chronic systolic (congestive) heart failure: Secondary | ICD-10-CM

## 2019-12-13 DIAGNOSIS — R531 Weakness: Secondary | ICD-10-CM | POA: Diagnosis not present

## 2019-12-13 DIAGNOSIS — I4821 Permanent atrial fibrillation: Secondary | ICD-10-CM

## 2019-12-13 DIAGNOSIS — I5022 Chronic systolic (congestive) heart failure: Secondary | ICD-10-CM | POA: Diagnosis not present

## 2019-12-13 DIAGNOSIS — I429 Cardiomyopathy, unspecified: Secondary | ICD-10-CM

## 2019-12-13 DIAGNOSIS — I517 Cardiomegaly: Secondary | ICD-10-CM | POA: Diagnosis not present

## 2019-12-13 DIAGNOSIS — I495 Sick sinus syndrome: Secondary | ICD-10-CM

## 2019-12-13 DIAGNOSIS — R0602 Shortness of breath: Secondary | ICD-10-CM | POA: Diagnosis not present

## 2019-12-13 LAB — BASIC METABOLIC PANEL
Anion gap: 12 (ref 5–15)
BUN: 36 mg/dL — ABNORMAL HIGH (ref 8–23)
CO2: 27 mmol/L (ref 22–32)
Calcium: 9.1 mg/dL (ref 8.9–10.3)
Chloride: 100 mmol/L (ref 98–111)
Creatinine, Ser: 1.14 mg/dL (ref 0.61–1.24)
GFR calc Af Amer: >60 mL/min (ref >60)
GFR calc non Af Amer: 54 mL/min — ABNORMAL LOW (ref >60)
Glucose, Bld: 107 mg/dL — ABNORMAL HIGH (ref 70–99)
Potassium: 4 mmol/L (ref 3.5–5.1)
Sodium: 139 mmol/L (ref 135–145)

## 2019-12-13 LAB — CBC
HCT: 39.5 % (ref 39.0–52.0)
Hemoglobin: 13.8 g/dL (ref 13.0–17.0)
MCH: 35.3 pg — ABNORMAL HIGH (ref 26.0–34.0)
MCHC: 34.9 g/dL (ref 30.0–36.0)
MCV: 101 fL — ABNORMAL HIGH (ref 80.0–100.0)
Platelets: 229 10*3/uL (ref 150–400)
RBC: 3.91 MIL/uL — ABNORMAL LOW (ref 4.22–5.81)
RDW: 12.6 % (ref 11.5–15.5)
WBC: 8.6 10*3/uL (ref 4.0–10.5)
nRBC: 0 % (ref 0.0–0.2)

## 2019-12-13 MED ORDER — METOLAZONE 2.5 MG PO TABS: 2.5000 mg | ORAL_TABLET | Freq: Every day | ORAL | 0 refills | Status: DC | PRN

## 2019-12-13 NOTE — Progress Notes (Signed)
Office Visit    Patient Name: Douglas Calhoun Date of Encounter: 12/13/2019  Primary Care Provider:  Birdie Sons, MD Primary Cardiologist:  Kate Sable, MD  Chief Complaint    86 year old male with a history of atrial fibrillation on Eliquis, sick sinus syndrome status post leadless pacemaker in September 2020, HFrEF/cardiomyopathy with an EF of 25% by echo in October 2020, who presents for heart failure follow-up.  Past Medical History    Past Medical History:  Diagnosis Date  . Achilles tendinitis   . Cardiomyopathy ([place])    a. 01/2019 Echo: EF 25%, glob HK.  . Cataract   . Chronic HFrEF (heart failure with reduced ejection fraction) ([place])    a. 03/2018 Echo: EF 45-50%); b. 01/2019 Echo: EF 25%. Glob HK. Triv AI/PR, Mod TR. RVSP 57.47mmHg.  Marland Kitchen Hearing loss   . Herpes zoster without complication 4/48/1856  . Permanent atrial fibrillation (HCC)    a. CHA2DS2VASc = 4-->Eliquis.  . Personal history of prostate cancer 10/08/2014   s/p 15 radiation treatments treated by Dr. Madelin Headings   . SSS (sick sinus syndrome) (HCC)    a. s/p MDT Micra AV leadless PPM (ser # [id number]).  . Varicose vein of leg    Past Surgical History:  Procedure Laterality Date  . APPENDECTOMY  1960's  . BREAST SURGERY     Breast Biopsy prior to 1960  . CATARACT EXTRACTION Left   . PACEMAKER LEADLESS INSERTION N/A 01/11/2019   Procedure: PACEMAKER LEADLESS INSERTION;  Surgeon: Isaias Cowman, MD;  Location: [place] [facility];  Service: Cardiovascular;  Laterality: N/A;    Allergies  No Known Allergies  History of Present Illness    86 year old male with the above complex past medical history including atrial fibrillation on Eliquis, sick sinus syndrome status post leadless permanent pacemaker in September 2020, HFrEF/cardiomyopathy with an EF of 25%.  He was previously followed at [facility] and establish cardiology care with [place] in late 2020.  He was last seen in cardiology  clinic in May 2021 at which time his weight was relatively stable, trending approximately 185 pounds at home.  Titration of heart failure medications limited by soft blood pressures.  Over the past 6 to 8 weeks however, his weight has been trending higher, frequently in the high 180s to 190.  His caregiver pays close attention to his weight and over the past month, he has more or less been taking torsemide 40 mg twice daily (previously prescribed as 40 mg in the morning and 20 mg in the afternoon).  Despite the higher dose, weights have continued to trend near 190.  In that setting, he has also had increasing dyspnea on exertion.  He has not had any chest pain or significant lower extremity edema.  He denies palpitations, PND, orthopnea, dizziness, syncope.  He has chronic early satiety which has not changed.  He does not add salt to his food but lives at an assisted living and per his caregiver, this food there is very salty.  On arrival today, [place] medical mall, his oxygen saturation was initially 81% on room air but improved to 90% with rest.  Home Medications    Prior to Admission medications   Medication Sig Start Date End Date Taking? Authorizing Provider  acetaminophen (TYLENOL) 325 MG tablet Take 650 mg by mouth 3 (three) times daily.    [provider]  alendronate (FOSAMAX) 70 MG tablet Take 70 mg by mouth once a week.  Take with a full glass of water on an empty stomach on Saturdays.    [provider]  amitriptyline (ELAVIL) 25 MG tablet Take 25 mg by mouth at bedtime. 05/18/19   [provider]  apixaban (ELIQUIS) 5 MG TABS tablet Take 5 mg by mouth 2 (two) times daily.    [provider]  Ascorbic Acid (VITAMIN C) 1000 MG tablet Take 1,000 mg by mouth daily.    [provider]  Calcium Carbonate-Vitamin D (CALCIUM 500 + D PO) Take 1 tablet by mouth daily.    [provider]  cholecalciferol (VITAMIN D3) 25 MCG (1000 UT)  tablet Take 2,000 Units by mouth daily.     [provider]  famotidine (PEPCID) 20 MG tablet TAKE 1 TABLET BY MOUTH TWICE DAILY FOR GASTRITIS 08/06/19   Birdie Sons, MD  ferrous sulfate 325 (65 FE) MG EC tablet Take 325 mg by mouth daily with breakfast.    [provider]  gabapentin (NEURONTIN) 100 MG capsule TAKE 1 CAPSULE IN THE MORNING AND TWO AT NIGHT 04/05/19   Birdie Sons, MD  Multiple Vitamins-Minerals (MULTIVITAMIN ADULT PO) Take 1 tablet by mouth daily. 07/17/08   [provider]  Multiple Vitamins-Minerals (ZINC PO) Take by mouth daily.    [provider]  Polyethylene Glycol 3350 (MIRALAX PO) Take 1 Dose by mouth daily. Heaping teaspoon    [provider]  potassium chloride (K-DUR) 10 MEQ tablet TAKE 1 TABLET BY MOUTH DAILY Patient taking differently: Take 10 mEq by mouth daily.  01/08/19   Birdie Sons, MD  Probiotic CAPS Take 1 capsule by mouth daily.    [provider]  Psyllium (METAMUCIL PO) Take 1 Dose by mouth daily.    [provider]  pyridOXINE (VITAMIN B-6) 100 MG tablet Take 100 mg by mouth daily.    [provider]  tamsulosin (FLOMAX) 0.4 MG CAPS capsule Take 0.4 mg by mouth daily after breakfast.     [provider]  torsemide (DEMADEX) 20 MG tablet Take 40 mg (2 tablets) in the am and 20 mg (1 tablet) in the pm 05/25/19   Kate Sable, MD  Vitamin E 400 units TABS Take 1 tablet by mouth daily. 07/17/08   [provider]    Review of Systems    Increase weight trend over the past 6 to 8 weeks with increasing dyspnea on exertion.  No significant chest pain, palpitations, PND, orthopnea, dizziness, syncope, edema.  His chronic early satiety..  All other systems reviewed and are otherwise negative except as noted above.  Physical Exam    VS:  BP 104/68   Pulse (!) 52   Ht 5\' 6"  (1.676 m)   Wt 193 lb 9.6 oz (87.8 kg)   SpO2 (!) 89%   BMI 31.25 kg/m  , BMI Body  mass index is 31.25 kg/m.  ReDS Vest 40%. GEN: Well nourished, well developed, in no acute distress. HEENT: normal. Neck: Supple, I do not appreciate any significant JVD.  No carotid bruits, or masses. Cardiac: RRR, no murmurs, rubs, or gallops. No clubbing, cyanosis, edema.  Radials/PT 1+ and equal bilaterally.  Respiratory:  Respirations regular and unlabored, diminished breath sounds at bilateral bases. GI: Soft, nontender, nondistended, BS + x 4. MS: no deformity or atrophy. Skin: warm and dry, no rash. Neuro:  Strength and sensation are intact. Psych: Normal affect.  Accessory Clinical Findings    ECG personally reviewed by me today -atrial  fibrillation, 74, PVCs, left axis deviation, incomplete right bundle branch block, ? Prior inf/ant infarcts - no acute changes.  Lab Results  Component Value Date   WBC 8.6 12/13/2019   HGB 13.8 12/13/2019   HCT 39.5 12/13/2019   MCV 101.0 (H) 12/13/2019   PLT 229 12/13/2019   Lab Results  Component Value Date   CREATININE 1.14 12/13/2019   BUN 36 (H) 12/13/2019   NA 139 12/13/2019   K 4.0 12/13/2019   CL 100 12/13/2019   CO2 27 12/13/2019   Lab Results  Component Value Date   ALT 17 11/19/2014   AST 30 11/19/2014   ALKPHOS 52 11/19/2014   BILITOT 0.7 11/19/2014   Lab Results  Component Value Date   CHOL 231 (A) 03/06/2013   HDL 78 (A) 03/06/2013   LDLCALC 133 03/06/2013   TRIG 98 03/06/2013    CXR 12/13/2019  IMPRESSION: Cardiomegaly. Aortic atherosclerosis. Implantable pacemaker. Interstitial lung markings more prominent at the bases. Question early interstitial edema. _____________   Assessment & Plan    1.  Acute on chronic systolic congestive heart failure/cardiomyopathy: Over the past 6 to 8 weeks, patient's weight has been trending roughly 3 to 5 pounds above prior baseline of 185 pounds.  In that setting, he has had also had increasing dyspnea on exertion.  He does not appear significantly volume overloaded on  exam though he has markedly diminished breath sounds at the bases and his ReDS Vest reading was abnormal at 40.  He has been taking torsemide 40 mg twice daily for the better part of the past few weeks (prescribed dose to 40 mg in the a.m. and 20 mg in the afternoon).  Despite the escalation of diuretic dosing, weight has continued to trend high.  Renal function is relatively stable compared to labs earlier this year.  CBC with normal blood counts.  Chest x-ray shows early interstitial edema.  I am going to add metolazone 2.5 mg to be taken before his AM torsemide dose over the next 2 days and we will see him back next week, at which time I will repeat labs.  In the long run, he may benefit from metolazone once or twice a week-we will just need to see how he responds.  We have not been able to use  blocker, acei/arb/arni/mra 2/2 chronically soft bp's/relative hypotension.  2.  Permanent Afib:  Rate controlled w/ AVN blocking agent.  Cont eliquis.  3.  SSS: s/p leadless PPM.  EP f/u as scheduled.  4.  Dispo:  F/u in 1 wk.   Murray Hodgkins, NP 12/13/2019, 5:10 PM

## 2019-12-13 NOTE — Patient Instructions (Signed)
Medication Instructions:  1- Take Metolazone 1 tablet (2.5 mg total) this afternoon 30 min prior to Torsemide 2- Take Metolazone 1 tablet (2.5 mg total) tomorrow am 30 min prior to Torsemide *If you need a refill on your cardiac medications before your next appointment, please call your pharmacy*   Lab Work: Your physician recommends that you have lab work today(BMET, CBC) @ the medical mall  If you have labs (blood work) drawn today and your tests are completely normal, you will receive your results only by: Marland Kitchen MyChart Message (if you have MyChart) OR . A paper copy in the mail If you have any lab test that is abnormal or we need to change your treatment, we will call you to review the results.   Testing/Procedures: 1- A chest x-ray takes a picture of the organs and structures inside the chest, including the heart, lungs, and blood vessels. This test can show several things, including, whether the heart is enlarges; whether fluid is building up in the lungs; and whether pacemaker / defibrillator leads are still in place. @ the medical mall today   Follow-Up: At [facility], you and your health needs are our priority.  As part of our continuing mission to provide you with exceptional heart care, we have created designated Provider Care Teams.  These Care Teams include your primary Cardiologist (physician) and Advanced Practice Providers (APPs -  Physician Assistants and Nurse Practitioners) who all work together to provide you with the care you need, when you need it.  We recommend signing up for the patient portal called "MyChart".  Sign up information is provided on this After Visit Summary.  MyChart is used to connect with patients for Virtual Visits (Telemedicine).  Patients are able to view lab/test results, encounter notes, upcoming appointments, etc.  Non-urgent messages can be sent to your provider as well.   To learn more about what you can do with MyChart, go to  [email or web address].    Your next appointment:   1 week(s)  The format for your next appointment:   In Person  Provider:   Ignacia Bayley, NP

## 2019-12-14 ENCOUNTER — Telehealth: Payer: Self-pay

## 2019-12-14 NOTE — Telephone Encounter (Signed)
-----   Message from Theora Gianotti, NP sent at 12/13/2019  1:34 PM EDT ----- Renal function relatively stable, though trending toward dry side.  Blood counts ok.  CXR with mild fluid, but not significant heart failure or infection.  Let's stick with plan for metolazone 2.5mg  today and tomorrow and early f/u next week.  I suspect we may need to use metolazone once or twice/week going forward but can reassess next week.

## 2019-12-14 NOTE — Telephone Encounter (Signed)
Call to patient to review labs.    Pt verbalized understanding and has no further questions at this time.    Advised pt to call for any further questions or concerns.  No further orders.    Pt requested I call caregiver Fraser Din as well. I made call to her and left detailed message.

## 2019-12-20 ENCOUNTER — Encounter: Payer: Self-pay | Admitting: Nurse Practitioner

## 2019-12-20 ENCOUNTER — Ambulatory Visit: Payer: Medicare Other | Admitting: Nurse Practitioner

## 2019-12-20 ENCOUNTER — Other Ambulatory Visit: Payer: Self-pay

## 2019-12-20 VITALS — BP 90/60 | HR 82 | Ht 68.0 in | Wt 187.5 lb

## 2019-12-20 DIAGNOSIS — I429 Cardiomyopathy, unspecified: Secondary | ICD-10-CM | POA: Diagnosis not present

## 2019-12-20 DIAGNOSIS — I5023 Acute on chronic systolic (congestive) heart failure: Secondary | ICD-10-CM | POA: Diagnosis not present

## 2019-12-20 DIAGNOSIS — I495 Sick sinus syndrome: Secondary | ICD-10-CM

## 2019-12-20 DIAGNOSIS — I4821 Permanent atrial fibrillation: Secondary | ICD-10-CM | POA: Diagnosis not present

## 2019-12-20 MED ORDER — METOLAZONE 2.5 MG PO TABS: 2.5000 mg | ORAL_TABLET | Freq: Every day | ORAL | 3 refills | Status: DC | PRN

## 2019-12-20 MED ORDER — TORSEMIDE 20 MG PO TABS: ORAL_TABLET | ORAL | 3 refills | Status: DC

## 2019-12-20 NOTE — Progress Notes (Signed)
Office Visit    Patient Name: Douglas Calhoun Date of Encounter: 12/20/2019  Primary Care Provider:  Birdie Sons, MD Primary Cardiologist:  Kate Sable, MD  Chief Complaint    86 year old male with a history of permanent atrial fibrillation on Eliquis, sick sinus syndrome status post leadless pacemaker in September 2020, HFrEF/cardiomyopathy with an EF of 25% by echo in October 2020, who presents for follow-up of CHF.  Past Medical History    Past Medical History:  Diagnosis Date   Achilles tendinitis    Cardiomyopathy ([place])    a. 01/2019 Echo: EF 25%, glob HK.   Cataract    Chronic HFrEF (heart failure with reduced ejection fraction) ([place])    a. 03/2018 Echo: EF 45-50%); b. 01/2019 Echo: EF 25%. Glob HK. Triv AI/PR, Mod TR. RVSP 57.24mmHg.   Hearing loss    Herpes zoster without complication 1/54/0086   Permanent atrial fibrillation (HCC)    a. CHA2DS2VASc = 4-->Eliquis.   Personal history of prostate cancer 10/08/2014   s/p 15 radiation treatments treated by Dr. Madelin Headings    SSS (sick sinus syndrome) [facility])    a. s/p MDT Micra AV leadless PPM (ser # [id number]).   Varicose vein of leg    Past Surgical History:  Procedure Laterality Date   APPENDECTOMY  1960's   BREAST SURGERY     Breast Biopsy prior to [place] N/A 01/11/2019   Procedure: PACEMAKER LEADLESS INSERTION;  Surgeon: Isaias Cowman, MD;  Location: [place] [facility];  Service: Cardiovascular;  Laterality: N/A;    Allergies  No Known Allergies  History of Present Illness    86 year old male with the above complex past medical history including atrial fibrillation on Eliquis, sick sinus syndrome status post leadless permanent pacemaker September 2020, HFrEF/cardiomyopathy with an EF of 25%.  Previously followed by [facility] clinic and establish cardiology care with [place] in late 2020.  He was last seen in clinic on August 19, at  which time he reported a 6 to 8-week history of increased weight, frequently in the high 180s to 190 with progressive dyspnea on exertion and increasing abdominal girth, despite doubling his torsemide 40 mg twice daily.  His initial oxygen saturation after walking from the medical mall was 81% on room air but improved to 90% with rest.  ReDS Vest reading was elevated at 40.  We obtained a chest x-ray which did show early interstitial edema.  CBC was normal.  Creatinine was stable at 1.14.  I recommended continuation of torsemide 40 mg twice daily and added metolazone 2.5 mg x 2 days with a plan to reevaluate this week and likely add it twice a week afterward.  Since his last visit, weight is down 6 pounds.  He has noted improvement in dyspnea.  He continues to have some dyspnea on exertion though overall, this is stable.  He has not had any significant edema or increase in abdominal girth.  He denies chest pain, palpitations, PND, orthopnea, dizziness, syncope, or early satiety.  Home Medications    Prior to Admission medications   Medication Sig Start Date End Date Taking? Authorizing Provider  acetaminophen (TYLENOL) 325 MG tablet Take 650 mg by mouth 3 (three) times daily.    [provider]  alendronate (FOSAMAX) 70 MG tablet Take 70 mg by mouth once a week. Take with a full glass of water on an empty stomach on Saturdays.  [provider]  amitriptyline (ELAVIL) 25 MG tablet Take 25 mg by mouth at bedtime. 05/18/19   [provider]  apixaban (ELIQUIS) 5 MG TABS tablet Take 5 mg by mouth 2 (two) times daily.    [provider]  Ascorbic Acid (VITAMIN C) 1000 MG tablet Take 1,000 mg by mouth daily.    [provider]  Calcium Carbonate-Vitamin D (CALCIUM 500 + D PO) Take 1 tablet by mouth daily.    [provider]  cholecalciferol (VITAMIN D3) 25 MCG (1000 UT) tablet Take 2,000 Units by mouth daily.     [provider]  ferrous  sulfate 325 (65 FE) MG EC tablet Take 325 mg by mouth daily with breakfast.    [provider]  gabapentin (NEURONTIN) 100 MG capsule TAKE 1 CAPSULE IN THE MORNING AND TWO AT NIGHT 04/05/19   Birdie Sons, MD  metolazone (ZAROXOLYN) 2.5 MG tablet Take 1 tablet (2.5 mg total) by mouth daily as needed (for SOB. take 30 min prior to Torsemide dose). 12/13/19 03/12/20  Theora Gianotti, NP  Multiple Vitamins-Minerals (MULTIVITAMIN ADULT PO) Take 1 tablet by mouth daily. 07/17/08   [provider]  Polyethylene Glycol 3350 (MIRALAX PO) Take 1 Dose by mouth daily. Heaping teaspoon    [provider]  potassium chloride (K-DUR) 10 MEQ tablet TAKE 1 TABLET BY MOUTH DAILY 01/08/19   Birdie Sons, MD  Probiotic CAPS Take 1 capsule by mouth daily.    [provider]  Psyllium (METAMUCIL PO) Take 1 Dose by mouth daily.    [provider]  pyridOXINE (VITAMIN B-6) 100 MG tablet Take 100 mg by mouth daily.    [provider]  tamsulosin (FLOMAX) 0.4 MG CAPS capsule Take 0.4 mg by mouth daily after breakfast.     [provider]  torsemide (DEMADEX) 20 MG tablet Take 40 mg (2 tablets) in the am and 20 mg (1 tablet) in the pm 05/25/19   Kate Sable, MD  Vitamin E 400 units TABS Take 1 tablet by mouth daily. 07/17/08   [provider]    Review of Systems    Much improved compared to last week.  He denies chest pain, palpitations, PND, orthopnea, dizziness, syncope, edema, or early satiety.  He does continue to have some chronic dyspnea though this is improved.  All other systems reviewed and are otherwise negative except as noted above.  Physical Exam    VS:  BP 90/60 (BP Location: Left Arm, Patient Position: Sitting, Cuff Size: Normal)    Pulse 82    Ht 5\' 8"  (1.727 m)    Wt 187 lb 8 oz (85 kg)    SpO2 96%    BMI 28.51 kg/m  , BMI Body mass index is 28.51 kg/m. GEN: Well nourished, well developed, in no acute  distress. HEENT: normal. Neck: Supple, no JVD, carotid bruits, or masses. Cardiac: Irregularly irregular, no murmurs, rubs, or gallops. No clubbing, cyanosis, edema.  Radials/PT 2+ and equal bilaterally.  Respiratory:  Respirations regular and unlabored, clear to auscultation bilaterally. GI: Soft, nontender, nondistended, BS + x 4. MS: no deformity or atrophy. Skin: warm and dry, no rash. Neuro:  Strength and sensation are intact. Psych: Normal affect.  Accessory Clinical Findings    ECG personally reviewed by me today -atrial fibrillation, 82, left axis deviation, right bundle branch block, PVCs- no acute changes.  Lab Results  Component Value Date   WBC 8.6 12/13/2019  HGB 13.8 12/13/2019   HCT 39.5 12/13/2019   MCV 101.0 (H) 12/13/2019   PLT 229 12/13/2019   Lab Results  Component Value Date   CREATININE 1.14 12/13/2019   BUN 36 (H) 12/13/2019   NA 139 12/13/2019   K 4.0 12/13/2019   CL 100 12/13/2019   CO2 27 12/13/2019   Lab Results  Component Value Date   ALT 17 11/19/2014   AST 30 11/19/2014   ALKPHOS 52 11/19/2014   BILITOT 0.7 11/19/2014   Lab Results  Component Value Date   CHOL 231 (A) 03/06/2013   HDL 78 (A) 03/06/2013   LDLCALC 133 03/06/2013   TRIG 98 03/06/2013     Assessment & Plan    1.  Acute on chronic heart failure with reduced EF/cardiomyopathy: At his last visit, he was experiencing increasing dyspnea and weight gain.  ReDS Vest was abnormal at 40.  He had had been taking torsemide 40 mg twice daily.  I added metolazone 2.5 mg x 2 days and he had significant response with 6 pound weight loss.  He has been trending about 183 on his home scale since then and has noted improvement in dyspnea, though he still has some chronic, mild dyspnea exertion.  He appears euvolemic on examination today and his ReDS Vest is 34%.  Continue current torsemide dose and after discussion with his caregiver today, we will only use metolazone 2.5 mg as needed for  weight gain of 3 pounds or greater.  They have been advised that if he is using metolazone more than 1-2 times per week, that they are to notify our office as we will need to follow-up labs more frequently.  Soft blood pressure continues to prohibit initiation of beta-blocker, ACE inhibitor, ARB, ARNI, or MRA.  2.  Permanent atrial fibrillation: Rate controlled without AV nodal blocking agent.  Continue Eliquis.  3.  Sick sinus syndrome: Status post leadless permanent pacemaker.  Follow-up with EP as scheduled.  4.  Disposition: Follow-up basic metabolic panel today.  Follow-up in clinic in approximately 4 to 6 weeks or sooner if necessary.   Murray Hodgkins, NP 12/20/2019, 11:14 AM

## 2019-12-20 NOTE — Patient Instructions (Signed)
Medication Instructions:  1- INCREASE Metolazone Take 1 tablet (2.5 mg total) by mouth daily as needed (for weight gain of 3 lbs overnight. take 30 min prior to Torsemide dose). *If you need a refill on your cardiac medications before your next appointment, please call your pharmacy*  Lab Work: Your physician recommends that you have lab work today(BMET)  If you have labs (blood work) drawn today and your tests are completely normal, you will receive your results only by: Marland Kitchen MyChart Message (if you have MyChart) OR . A paper copy in the mail If you have any lab test that is abnormal or we need to change your treatment, we will call you to review the results.  Testing/Procedures:none ordered  Follow-Up: At [facility], you and your health needs are our priority.  As part of our continuing mission to provide you with exceptional heart care, we have created designated Provider Care Teams.  These Care Teams include your primary Cardiologist (physician) and Advanced Practice Providers (APPs -  Physician Assistants and Nurse Practitioners) who all work together to provide you with the care you need, when you need it.  We recommend signing up for the patient portal called "MyChart".  Sign up information is provided on this After Visit Summary.  MyChart is used to connect with patients for Virtual Visits (Telemedicine).  Patients are able to view lab/test results, encounter notes, upcoming appointments, etc.  Non-urgent messages can be sent to your provider as well.   To learn more about what you can do with MyChart, go to [email or web address].    Your next appointment:   4-6 week(s)  The format for your next appointment:   In Person  Provider:    You may see Kate Sable, MD or Murray Hodgkins, NP

## 2019-12-21 ENCOUNTER — Telehealth: Payer: Self-pay

## 2019-12-21 DIAGNOSIS — I5023 Acute on chronic systolic (congestive) heart failure: Secondary | ICD-10-CM

## 2019-12-21 LAB — BASIC METABOLIC PANEL
BUN/Creatinine Ratio: 41 — ABNORMAL HIGH (ref 10–24)
BUN: 62 mg/dL — ABNORMAL HIGH (ref 10–36)
CO2: 33 mmol/L — ABNORMAL HIGH (ref 20–29)
Calcium: 9.7 mg/dL (ref 8.6–10.2)
Chloride: 79 mmol/L — ABNORMAL LOW (ref 96–106)
Creatinine, Ser: 1.53 mg/dL — ABNORMAL HIGH (ref 0.76–1.27)
GFR calc Af Amer: 43 mL/min/{1.73_m2} — ABNORMAL LOW (ref >59)
GFR calc non Af Amer: 38 mL/min/{1.73_m2} — ABNORMAL LOW (ref >59)
Glucose: 139 mg/dL — ABNORMAL HIGH (ref 65–99)
Potassium: 3.4 mmol/L — ABNORMAL LOW (ref 3.5–5.2)
Sodium: 130 mmol/L — ABNORMAL LOW (ref 134–144)

## 2019-12-21 NOTE — Telephone Encounter (Signed)
Attempted to call patient. LMTCB 12/21/2019   

## 2019-12-21 NOTE — Telephone Encounter (Signed)
-----   Message from Theora Gianotti, NP sent at 12/21/2019  1:03 PM EDT ----- Kidney's are dry.  Potassium is low.  Pls reduce torsemide back to 40mg  daily.  Please have him take an additional 30 meq of potassium today only (has 10 meq tabs).  Will need f/u bmet next Thursday.

## 2020-01-08 MED ORDER — TORSEMIDE 20 MG PO TABS: ORAL_TABLET | ORAL | 3 refills | Status: DC

## 2020-01-08 NOTE — Telephone Encounter (Signed)
Spoke to patient. He asked that I call caregiver, Ms Hissong to review results.  Made call to her, LMCTB.

## 2020-01-08 NOTE — Telephone Encounter (Signed)
Call to patient to review labs.    Spoke to caregiver, Ms Hissong, she verbalized understanding and has no further questions at this time.    Advised pt to call for any further questions or concerns.  Orders updated as requested.

## 2020-01-15 ENCOUNTER — Other Ambulatory Visit
Admission: RE | Admit: 2020-01-15 | Discharge: 2020-01-15 | Disposition: A | Discharge status: Home / Self Care | Payer: Medicare Other | Attending: Nurse Practitioner | Admitting: Nurse Practitioner

## 2020-01-15 DIAGNOSIS — I5023 Acute on chronic systolic (congestive) heart failure: Secondary | ICD-10-CM

## 2020-01-15 LAB — BASIC METABOLIC PANEL
Anion gap: 10 (ref 5–15)
BUN: 48 mg/dL — ABNORMAL HIGH (ref 8–23)
CO2: 26 mmol/L (ref 22–32)
Calcium: 9 mg/dL (ref 8.9–10.3)
Chloride: 100 mmol/L (ref 98–111)
Creatinine, Ser: 1.07 mg/dL (ref 0.61–1.24)
GFR calc Af Amer: >60 mL/min (ref >60)
GFR calc non Af Amer: 58 mL/min — ABNORMAL LOW (ref >60)
Glucose, Bld: 102 mg/dL — ABNORMAL HIGH (ref 70–99)
Potassium: 3.6 mmol/L (ref 3.5–5.1)
Sodium: 136 mmol/L (ref 135–145)

## 2020-01-17 ENCOUNTER — Telehealth: Payer: Self-pay | Admitting: Cardiology

## 2020-01-17 DIAGNOSIS — I5022 Chronic systolic (congestive) heart failure: Secondary | ICD-10-CM

## 2020-01-17 MED ORDER — METOLAZONE 2.5 MG PO TABS: ORAL_TABLET | ORAL | 5 refills | Status: DC

## 2020-01-17 NOTE — Telephone Encounter (Signed)
Pt c/o swelling: STAT is pt has developed SOB within 24 hours  1) How much weight have you gained and in what time span? 2 pounds overnight after gaining 7 recently   2) If swelling, where is the swelling located? Retains fluid all over   3) Are you currently taking a fluid pill? Yes 40 mg torsemide po q d   4) Are you currently SOB? Yes chronic but increased since start of weight gain   5) Do you have a log of your daily weights (if so, list)? Yes currently 193  6) Have you gained 3 pounds in a day or 5 pounds in a week?  yes  7) Have you traveled recently? No

## 2020-01-17 NOTE — Telephone Encounter (Signed)
Spoke with Dr. Garen Lah, and he recommended that the patient Increase his Metolazone dose to take the 2.5 mg daily, 30 mins prior to his Torsemide 40mg .  He also advised the patient get a BMP drawn in 1 week.   Called Mardene Celeste (patients caregiver) and relayed the instructions. She verbalized understanding and agreed with the plan.

## 2020-01-17 NOTE — Telephone Encounter (Signed)
Spoke with patients caregiver and she stated that the patient has gained 2 pounds in the last day, and a total of 8 pounds since reducing his torsemide to 40mg  QD back on 8/27. She states he is also experiencing SOB, more so than his chronic baseline.   Will route to Dr. Garen Lah for review.

## 2020-01-24 ENCOUNTER — Other Ambulatory Visit
Admission: RE | Admit: 2020-01-24 | Discharge: 2020-01-24 | Disposition: A | Discharge status: Home / Self Care | Payer: Medicare Other | Attending: Cardiology | Admitting: Cardiology

## 2020-01-24 ENCOUNTER — Telehealth: Payer: Self-pay | Admitting: Cardiology

## 2020-01-24 DIAGNOSIS — I4821 Permanent atrial fibrillation: Secondary | ICD-10-CM

## 2020-01-24 DIAGNOSIS — I5022 Chronic systolic (congestive) heart failure: Secondary | ICD-10-CM | POA: Diagnosis not present

## 2020-01-24 LAB — BASIC METABOLIC PANEL
Anion gap: 19 — ABNORMAL HIGH (ref 5–15)
BUN: 89 mg/dL — ABNORMAL HIGH (ref 8–23)
CO2: 31 mmol/L (ref 22–32)
Calcium: 9.6 mg/dL (ref 8.9–10.3)
Chloride: 81 mmol/L — ABNORMAL LOW (ref 98–111)
Creatinine, Ser: 1.61 mg/dL — ABNORMAL HIGH (ref 0.61–1.24)
GFR calc Af Amer: 41 mL/min — ABNORMAL LOW (ref >60)
GFR calc non Af Amer: 35 mL/min — ABNORMAL LOW (ref >60)
Glucose, Bld: 153 mg/dL — ABNORMAL HIGH (ref 70–99)
Potassium: 2.4 mmol/L — CL (ref 3.5–5.1)
Sodium: 131 mmol/L — ABNORMAL LOW (ref 135–145)

## 2020-01-24 MED ORDER — POTASSIUM CHLORIDE ER 10 MEQ PO TBCR: 20.0000 meq | EXTENDED_RELEASE_TABLET | ORAL | 0 refills | Status: DC

## 2020-01-24 NOTE — Telephone Encounter (Addendum)
CRITICAL VALUE STICKER  CRITICAL VALUE: potassium 2.4  RECEIVER (on-site recipient of call): Olin Hauser   DATE & TIME NOTIFIED: 01/24/20 at 12:37 PM  MESSENGER (representative from lab): Mr. Marlynn Perking  MD NOTIFIED: Dr. Garen Lah  TIME OF NOTIFICATION: 12:38 PM  RESPONSE: Sent note for review and recommendations

## 2020-01-24 NOTE — Telephone Encounter (Signed)
Spoke with patients wife per release form and reviewed critical labs and recommendations for potassium. She requested we send in refill with instructions for taking 4 tablets for 5 days and then go to 2 tablets once daily with repeat labs in one week. She verbalized understanding of instructions, read back information for administration, and had no further questions at this time.

## 2020-01-24 NOTE — Telephone Encounter (Signed)
Left voicemail message to call back.    Please start KCl 40 mEq daily x5 days then 20 mEq daily after. Check BMP in 1 week. Thank you

## 2020-01-24 NOTE — Telephone Encounter (Signed)
Spoke with patient and he requested that I call [facility] with information because he does not hear well. Confirmed her number and he verbalized understanding.

## 2020-01-29 ENCOUNTER — Telehealth: Payer: Self-pay

## 2020-01-29 ENCOUNTER — Other Ambulatory Visit
Admission: RE | Admit: 2020-01-29 | Discharge: 2020-01-29 | Disposition: A | Discharge status: Home / Self Care | Payer: Medicare Other | Attending: Cardiology | Admitting: Cardiology

## 2020-01-29 DIAGNOSIS — I4821 Permanent atrial fibrillation: Secondary | ICD-10-CM | POA: Insufficient documentation

## 2020-01-29 DIAGNOSIS — E876 Hypokalemia: Secondary | ICD-10-CM

## 2020-01-29 LAB — BASIC METABOLIC PANEL
Anion gap: 19 — ABNORMAL HIGH (ref 5–15)
BUN: 79 mg/dL — ABNORMAL HIGH (ref 8–23)
CO2: 33 mmol/L — ABNORMAL HIGH (ref 22–32)
Calcium: 9.5 mg/dL (ref 8.9–10.3)
Chloride: 70 mmol/L — ABNORMAL LOW (ref 98–111)
Creatinine, Ser: 1.22 mg/dL (ref 0.61–1.24)
GFR calc non Af Amer: 49 mL/min — ABNORMAL LOW (ref >60)
Glucose, Bld: 149 mg/dL — ABNORMAL HIGH (ref 70–99)
Potassium: 2.4 mmol/L — CL (ref 3.5–5.1)
Sodium: 122 mmol/L — ABNORMAL LOW (ref 135–145)

## 2020-01-29 MED ORDER — POTASSIUM CHLORIDE ER 10 MEQ PO TBCR: 20.0000 meq | EXTENDED_RELEASE_TABLET | ORAL | 0 refills | Status: DC

## 2020-01-29 NOTE — Telephone Encounter (Signed)
Spoke with Dr. Rockey Situ and he recommended that the patient stop taking his Metolazone (which is most likely depleting his sodium), and take Potassium 40 mEq for 5 days, and then go to 30 mEq daily.  He also recommends that he increase his sodium intake, by drinking an electrolyte drink, and eating some soup high in sodium for the next 2 days.  He would also like a repeat BMP drawn in 1 week.  He also recommended if the patient is feeling like he wants to go to the ER, that would also be appropriate for IV replenishment.   I spoke with his caregiver Mardene Celeste and she verbalized understanding and agreed with the plan. She stated he has been feeling weak and dizzy, but he does not want to go sit in the ER for hours waiting. She also confirmed that the patient did in fact take all previous recommended Potassium ordered on 01/24/20.

## 2020-01-29 NOTE — Telephone Encounter (Signed)
Received critical K+ of 2.4 from [facility]. Will route to Dr. Rockey Situ (DOD).

## 2020-01-31 ENCOUNTER — Ambulatory Visit: Payer: Medicare Other | Admitting: Cardiology

## 2020-01-31 ENCOUNTER — Telehealth: Payer: Self-pay | Admitting: Cardiology

## 2020-01-31 NOTE — Telephone Encounter (Signed)
Patient wife called to cancel patient's appointment this morniing due to a fall. STates "he is laying in the floor , he just fell". States he went to turn around with his walker and fell. Patient denies any pain. Wife states that she is awaiting on assistance to come and help him. States patient is at a retirement [employer] - they are in route to help patient.

## 2020-01-31 NOTE — Telephone Encounter (Signed)
Spoke with patients caregiver Mardene Celeste. Patient is doing okay after fall. Arm  That he landed on is a little sore. He stated that he has been feeling a little better since increasing salt intake for two days and taking increased Potassium as directed on 01/29/20. I rescheduled patient to come in tomorrow afternoon.  Mardene Celeste was grateful for the quick follow up.

## 2020-02-01 ENCOUNTER — Encounter: Payer: Self-pay | Admitting: Cardiology

## 2020-02-01 ENCOUNTER — Ambulatory Visit (INDEPENDENT_AMBULATORY_CARE_PROVIDER_SITE_OTHER): Payer: Medicare Other | Admitting: Cardiology

## 2020-02-01 ENCOUNTER — Other Ambulatory Visit: Payer: Self-pay

## 2020-02-01 VITALS — BP 92/60 | HR 58 | Ht 68.0 in | Wt 185.0 lb

## 2020-02-01 DIAGNOSIS — I5022 Chronic systolic (congestive) heart failure: Secondary | ICD-10-CM | POA: Diagnosis not present

## 2020-02-01 DIAGNOSIS — I495 Sick sinus syndrome: Secondary | ICD-10-CM | POA: Diagnosis not present

## 2020-02-01 DIAGNOSIS — I4821 Permanent atrial fibrillation: Secondary | ICD-10-CM | POA: Diagnosis not present

## 2020-02-01 MED ORDER — POTASSIUM CHLORIDE ER 10 MEQ PO TBCR: 20.0000 meq | EXTENDED_RELEASE_TABLET | ORAL | 0 refills | Status: DC

## 2020-02-01 NOTE — Progress Notes (Signed)
Cardiology Office Note:    Date:  02/01/2020   ID:  Douglas Calhoun, DOB 12-06-1919, MRN [id number]  PCP:  Birdie Sons, MD  Cardiologist:  Kate Sable, MD  Electrophysiologist:  None   Referring MD: Birdie Sons, MD   Chief Complaint  Patient presents with  . Follow-up    Follow up for fluid retention and no complaints. Medications verbally reviewed with patient and his caregiver.     History of Present Illness:    Douglas Calhoun is a 86 y.o. male with a hx of atrial fibrillation on Eliquis, sick sinus syndrome status post leadless pacemaker 01/11/2019,  heart failure reduced ejection fraction, last EF 25% on 02/05/2019, who presents for follow-up.    He is being seen for congestive heart failure and lower extremity edema.  Goal weight around 183 to 187 pounds.  Patient had worsening edema at last visit, diuretics/torsemide were increased to 40 mg twice daily.  Metolazone was added.  BMP showed very low potassium hence metolazone was held.  Potassium repletion was given.  Patient now presents for follow-up.  Patient with recently having unsteady gait.  Has fallen 2 times over the past several weeks.  His neighbor saw him hitting his head.  Has a bruise in his right elbow.  Historical notes Patient was diagnosed with lumbar compression fracture in November 2020.  He was admitted at [facility].  Surgery was not performed due to his age and medical management recommended.  He was noted to be fluid overloaded at the time and was adequately diuresed.  On discharge, his weight was 180 pounds.   He has urinary obstruction and self caths themselves.  He denies any history of heart attacks or CAD.  Last echocardiogram date 02/05/2019 revealed severely reduced ejection fraction with EF 25%.  Moderately dilated left ventricle, moderately dilated right ventricle.   Past Medical History:  Diagnosis Date  . Achilles tendinitis   . Cardiomyopathy ([place])    a. 01/2019 Echo: EF 25%, glob HK.    . Cataract   . Chronic HFrEF (heart failure with reduced ejection fraction) ([place])    a. 03/2018 Echo: EF 45-50%); b. 01/2019 Echo: EF 25%. Glob HK. Triv AI/PR, Mod TR. RVSP 57.49mmHg.  Douglas Calhoun Hearing loss   . Herpes zoster without complication 9/73/5329  . Permanent atrial fibrillation (HCC)    a. CHA2DS2VASc = 4-->Eliquis.  . Personal history of prostate cancer 10/08/2014   s/p 15 radiation treatments treated by Dr. Madelin Headings   . SSS (sick sinus syndrome) (HCC)    a. s/p MDT Micra AV leadless PPM (ser # [id number]).  . Varicose vein of leg     Past Surgical History:  Procedure Laterality Date  . APPENDECTOMY  1960's  . BREAST SURGERY     Breast Biopsy prior to 1960  . CATARACT EXTRACTION Left   . PACEMAKER LEADLESS INSERTION N/A 01/11/2019   Procedure: PACEMAKER LEADLESS INSERTION;  Surgeon: Isaias Cowman, MD;  Location: [place] [facility];  Service: Cardiovascular;  Laterality: N/A;    Current Medications: Current Meds  Medication Sig  . acetaminophen (TYLENOL) 325 MG tablet Take 650 mg by mouth 3 (three) times daily.  Douglas Calhoun amitriptyline (ELAVIL) 25 MG tablet Take 25 mg by mouth at bedtime.  . Ascorbic Acid (VITAMIN C) 1000 MG tablet Take 1,000 mg by mouth daily.  . Calcium Carbonate-Vitamin D (CALCIUM 500 + D PO) Take 1 tablet by mouth daily.  . cholecalciferol (VITAMIN D3) 25 MCG (1000 UT) tablet  Take 2,000 Units by mouth daily.   . ferrous sulfate 325 (65 FE) MG EC tablet Take 325 mg by mouth daily with breakfast.  . gabapentin (NEURONTIN) 100 MG capsule TAKE 1 CAPSULE IN THE MORNING AND TWO AT NIGHT  . Multiple Vitamins-Minerals (MULTIVITAMIN ADULT PO) Take 1 tablet by mouth daily.  . Polyethylene Glycol 3350 (MIRALAX PO) Take 1 Dose by mouth daily. Heaping teaspoon  . potassium chloride (KLOR-CON) 10 MEQ tablet Take 2-4 tablets (20-40 mEq total) by mouth as directed. Take 4 tablets (40 mEq) daily for 5 days. Then go to 2 tablets (20 mEq) daily.  . Psyllium (METAMUCIL PO) Take  1 Dose by mouth daily.  Douglas Calhoun pyridOXINE (VITAMIN B-6) 100 MG tablet Take 100 mg by mouth daily.  . tamsulosin (FLOMAX) 0.4 MG CAPS capsule Take 0.4 mg by mouth daily after breakfast.   . torsemide (DEMADEX) 20 MG tablet Take 40 mg (2 tablets) daily. (Patient taking differently: Take 40 mg (2 tablets) in the morning and 1 tablet in the afternoon.)  . Vitamin E 400 units TABS Take 1 tablet by mouth daily.  . [DISCONTINUED] apixaban (ELIQUIS) 5 MG TABS tablet Take 5 mg by mouth 2 (two) times daily.  . [DISCONTINUED] potassium chloride (KLOR-CON) 10 MEQ tablet Take 2-4 tablets (20-40 mEq total) by mouth as directed. Take 4 tablets (40 mEq) daily for 5 days. Then go to 2 tablets (20 mEq) daily.     Allergies:   Patient has no known allergies.   Social History   Socioeconomic History  . Marital status: Legally Separated    Spouse name: Not on file  . Number of children: 1  . Years of education: Not on file  . Highest education level: Bachelor's degree (e.g., BA, AB, BS)  Occupational History  . Occupation: retired  Tobacco Use  . Smoking status: Never Smoker  . Smokeless tobacco: Never Used  Vaping Use  . Vaping Use: Never used  Substance and Sexual Activity  . Alcohol use: Not Currently    Alcohol/week: 0.0 standard drinks  . Drug use: No  . Sexual activity: Not on file  Other Topics Concern  . Not on file  Social History Narrative   Lives along at [place] Strain: Low Risk   . Difficulty of Paying Living Expenses: Not hard at all  Food Insecurity: No Food Insecurity  . Worried About Charity fundraiser in the Last Year: Never true  . Ran Out of Food in the Last Year: Never true  Transportation Needs: No Transportation Needs  . Lack of Transportation (Medical): No  . Lack of Transportation (Non-Medical): No  Physical Activity: Inactive  . Days of Exercise per Week: 0 days  . Minutes of Exercise per Session: 0 min  Stress: No  Stress Concern Present  . Feeling of Stress : Not at all  Social Connections: Socially Isolated  . Frequency of Communication with Friends and Family: Once a week  . Frequency of Social Gatherings with Friends and Family: More than three times a week  . Attends Religious Services: Never  . Active Member of Clubs or Organizations: No  . Attends Archivist Meetings: Never  . Marital Status: Separated     Family History: The patient's family history includes Alcoholism in his son; Heart disease in his mother; Stroke in his mother.  ROS:   Please see the history of present illness.     All  other systems reviewed and are negative.  EKGs/Labs/Other Studies Reviewed:    The following studies were reviewed today: TTEchocardiogram date 02/05/2019 revealed severely reduced ejection fraction with EF 25%.   Moderately dilated left ventricle,  moderately dilated right ventricle. Moderate MR Moderate TR  EKG:  EKG not obtained today  Recent Labs: 03/29/2019: Magnesium 2.4 12/13/2019: Hemoglobin 13.8; Platelets 229 01/29/2020: BUN 79; Creatinine, Ser 1.22; Potassium 2.4; Sodium 122  Recent Lipid Panel    Component Value Date/Time   CHOL 231 (A) 03/06/2013 0000   TRIG 98 03/06/2013 0000   HDL 78 (A) 03/06/2013 0000   LDLCALC 133 03/06/2013 0000    Physical Exam:    VS:  BP 92/60 (BP Location: Left Arm, Patient Position: Sitting, Cuff Size: Normal)   Pulse (!) 58   Ht 5\' 8"  (1.727 m)   Wt 185 lb (83.9 kg)   SpO2 93%   BMI 28.13 kg/m     Wt Readings from Last 3 Encounters:  02/01/20 185 lb (83.9 kg)  12/20/19 187 lb 8 oz (85 kg)  12/13/19 193 lb 9.6 oz (87.8 kg)     GEN:  Well nourished, well developed in mild distress due to back pain, frail looking HEENT: Normal NECK: No JVD; No carotid bruits LYMPHATICS: No lymphadenopathy CARDIAC: Irregular irregular, no murmurs, rubs, gallops RESPIRATORY:  Clear to auscultation without rales, wheezing or rhonchi  ABDOMEN:  Soft, non-tender, non-distended MUSCULOSKELETAL: No edema; No deformity  SKIN: Warm and dry NEUROLOGIC:  Alert and oriented x 3 PSYCHIATRIC:  Normal affect    ASSESSMENT:    1. Chronic systolic heart failure ([place])   2. Permanent atrial fibrillation ([place])   3. SSS (sick sinus syndrome) (HCC)    PLAN:    In order of problems listed above:  1. Patient with history of severely reduced EF, last EF 25%.  Patient's weight roughly 185 pounds at home. Holding off on heart failure medications in light of diuresing and low blood pressure.  Continue torsemide 40 mg in the morning and 20 nightly.  Stop metolazone.  Continue KCl 40 mEq daily.  Obtain BMP in 5 days to evaluate potassium.  Further recommendations pending potassium results. 2. Permanent atrial fibrillation.  Heart rate controlled, stop Eliquis, patient has history of falls, unsteady gait, recent fall last week. 3. Sick sinus syndrome status post leadless pacemaker.  Keep appointment with EP/device clinic for frequent checks.  Follow-up in 3 months.  This note was generated in part or whole with voice recognition software. Voice recognition is usually quite accurate but there are transcription errors that can and very often do occur. I apologize for any typographical errors that were not detected and corrected.  Medication Adjustments/Labs and Tests Ordered: Current medicines are reviewed at length with the patient today.  Concerns regarding medicines are outlined above.  No orders of the defined types were placed in this encounter.  Meds ordered this encounter  Medications  . potassium chloride (KLOR-CON) 10 MEQ tablet    Sig: Take 2-4 tablets (20-40 mEq total) by mouth as directed. Take 4 tablets (40 mEq) daily for 5 days. Then go to 2 tablets (20 mEq) daily.    Dispense:  200 tablet    Refill:  0    FOR FUTURE REFILLS PLEASE    Patient Instructions  Medication Instructions:   Your physician has recommended you make the  following change in your medication:   1)  STOP taking Eliquis. 2)  Take Potassium 40 mEq daily until  further notice.  *If you need a refill on your cardiac medications before your next appointment, please call your pharmacy*   Lab Work: Get Lab work as previously scheduled.  If you have labs (blood work) drawn today and your tests are completely normal, you will receive your results only by: Douglas Calhoun MyChart Message (if you have MyChart) OR . A paper copy in the mail If you have any lab test that is abnormal or we need to change your treatment, we will call you to review the results.   Testing/Procedures: None Ordered   Follow-Up: At [facility], you and your health needs are our priority.  As part of our continuing mission to provide you with exceptional heart care, we have created designated Provider Care Teams.  These Care Teams include your primary Cardiologist (physician) and Advanced Practice Providers (APPs -  Physician Assistants and Nurse Practitioners) who all work together to provide you with the care you need, when you need it.  We recommend signing up for the patient portal called "MyChart".  Sign up information is provided on this After Visit Summary.  MyChart is used to connect with patients for Virtual Visits (Telemedicine).  Patients are able to view lab/test results, encounter notes, upcoming appointments, etc.  Non-urgent messages can be sent to your provider as well.   To learn more about what you can do with MyChart, go to [email or web address].    Your next appointment:   3 month(s)  The format for your next appointment:   In Person  Provider:   Kate Sable, MD   Other Instructions      Signed, Kate Sable, MD  02/01/2020 5:24 PM    [place]

## 2020-02-01 NOTE — Patient Instructions (Signed)
Medication Instructions:   Your physician has recommended you make the following change in your medication:   1)  STOP taking Eliquis. 2)  Take Potassium 40 mEq daily until further notice.  *If you need a refill on your cardiac medications before your next appointment, please call your pharmacy*   Lab Work: Get Lab work as previously scheduled.  If you have labs (blood work) drawn today and your tests are completely normal, you will receive your results only by: Marland Kitchen MyChart Message (if you have MyChart) OR . A paper copy in the mail If you have any lab test that is abnormal or we need to change your treatment, we will call you to review the results.   Testing/Procedures: None Ordered   Follow-Up: At [facility], you and your health needs are our priority.  As part of our continuing mission to provide you with exceptional heart care, we have created designated Provider Care Teams.  These Care Teams include your primary Cardiologist (physician) and Advanced Practice Providers (APPs -  Physician Assistants and Nurse Practitioners) who all work together to provide you with the care you need, when you need it.  We recommend signing up for the patient portal called "MyChart".  Sign up information is provided on this After Visit Summary.  MyChart is used to connect with patients for Virtual Visits (Telemedicine).  Patients are able to view lab/test results, encounter notes, upcoming appointments, etc.  Non-urgent messages can be sent to your provider as well.   To learn more about what you can do with MyChart, go to [email or web address].    Your next appointment:   3 month(s)  The format for your next appointment:   In Person  Provider:   Kate Sable, MD   Other Instructions

## 2020-02-02 ENCOUNTER — Inpatient Hospital Stay
Admission: EM | Admit: 2020-02-02 | Discharge: 2020-02-08 | DRG: 643 | Disposition: A | Discharge status: Skilled Nursing Facility | Payer: Medicare Other | Attending: Internal Medicine | Admitting: Internal Medicine

## 2020-02-02 ENCOUNTER — Other Ambulatory Visit: Payer: Self-pay

## 2020-02-02 ENCOUNTER — Emergency Department: Payer: Medicare Other

## 2020-02-02 DIAGNOSIS — R778 Other specified abnormalities of plasma proteins: Secondary | ICD-10-CM | POA: Diagnosis not present

## 2020-02-02 DIAGNOSIS — T502X5A Adverse effect of carbonic-anhydrase inhibitors, benzothiadiazides and other diuretics, initial encounter: Secondary | ICD-10-CM | POA: Diagnosis not present

## 2020-02-02 DIAGNOSIS — I495 Sick sinus syndrome: Secondary | ICD-10-CM | POA: Diagnosis present

## 2020-02-02 DIAGNOSIS — R6889 Other general symptoms and signs: Secondary | ICD-10-CM | POA: Diagnosis not present

## 2020-02-02 DIAGNOSIS — J9601 Acute respiratory failure with hypoxia: Secondary | ICD-10-CM | POA: Diagnosis present

## 2020-02-02 DIAGNOSIS — Z9842 Cataract extraction status, left eye: Secondary | ICD-10-CM

## 2020-02-02 DIAGNOSIS — Z23 Encounter for immunization: Secondary | ICD-10-CM | POA: Diagnosis not present

## 2020-02-02 DIAGNOSIS — Z950 Presence of cardiac pacemaker: Secondary | ICD-10-CM

## 2020-02-02 DIAGNOSIS — I959 Hypotension, unspecified: Secondary | ICD-10-CM | POA: Diagnosis present

## 2020-02-02 DIAGNOSIS — I4891 Unspecified atrial fibrillation: Secondary | ICD-10-CM | POA: Diagnosis not present

## 2020-02-02 DIAGNOSIS — Z8546 Personal history of malignant neoplasm of prostate: Secondary | ICD-10-CM

## 2020-02-02 DIAGNOSIS — Z823 Family history of stroke: Secondary | ICD-10-CM

## 2020-02-02 DIAGNOSIS — I248 Other forms of acute ischemic heart disease: Secondary | ICD-10-CM | POA: Diagnosis present

## 2020-02-02 DIAGNOSIS — W19XXXA Unspecified fall, initial encounter: Secondary | ICD-10-CM

## 2020-02-02 DIAGNOSIS — R1312 Dysphagia, oropharyngeal phase: Secondary | ICD-10-CM | POA: Diagnosis not present

## 2020-02-02 DIAGNOSIS — I493 Ventricular premature depolarization: Secondary | ICD-10-CM | POA: Diagnosis not present

## 2020-02-02 DIAGNOSIS — E222 Syndrome of inappropriate secretion of antidiuretic hormone: Principal | ICD-10-CM | POA: Diagnosis present

## 2020-02-02 DIAGNOSIS — M6281 Muscle weakness (generalized): Secondary | ICD-10-CM | POA: Diagnosis not present

## 2020-02-02 DIAGNOSIS — Z20822 Contact with and (suspected) exposure to covid-19: Secondary | ICD-10-CM | POA: Diagnosis present

## 2020-02-02 DIAGNOSIS — I5022 Chronic systolic (congestive) heart failure: Secondary | ICD-10-CM | POA: Diagnosis not present

## 2020-02-02 DIAGNOSIS — E611 Iron deficiency: Secondary | ICD-10-CM | POA: Diagnosis not present

## 2020-02-02 DIAGNOSIS — I4821 Permanent atrial fibrillation: Secondary | ICD-10-CM | POA: Diagnosis present

## 2020-02-02 DIAGNOSIS — R296 Repeated falls: Secondary | ICD-10-CM | POA: Diagnosis present

## 2020-02-02 DIAGNOSIS — Z7901 Long term (current) use of anticoagulants: Secondary | ICD-10-CM

## 2020-02-02 DIAGNOSIS — Z743 Need for continuous supervision: Secondary | ICD-10-CM | POA: Diagnosis not present

## 2020-02-02 DIAGNOSIS — Z8249 Family history of ischemic heart disease and other diseases of the circulatory system: Secondary | ICD-10-CM

## 2020-02-02 DIAGNOSIS — E871 Hypo-osmolality and hyponatremia: Secondary | ICD-10-CM | POA: Diagnosis not present

## 2020-02-02 DIAGNOSIS — Z7401 Bed confinement status: Secondary | ICD-10-CM | POA: Diagnosis not present

## 2020-02-02 DIAGNOSIS — M2550 Pain in unspecified joint: Secondary | ICD-10-CM | POA: Diagnosis not present

## 2020-02-02 DIAGNOSIS — Z79899 Other long term (current) drug therapy: Secondary | ICD-10-CM | POA: Diagnosis not present

## 2020-02-02 DIAGNOSIS — R531 Weakness: Secondary | ICD-10-CM | POA: Diagnosis not present

## 2020-02-02 DIAGNOSIS — R2681 Unsteadiness on feet: Secondary | ICD-10-CM | POA: Diagnosis not present

## 2020-02-02 DIAGNOSIS — Y929 Unspecified place or not applicable: Secondary | ICD-10-CM | POA: Diagnosis not present

## 2020-02-02 DIAGNOSIS — I499 Cardiac arrhythmia, unspecified: Secondary | ICD-10-CM | POA: Diagnosis not present

## 2020-02-02 DIAGNOSIS — E1165 Type 2 diabetes mellitus with hyperglycemia: Secondary | ICD-10-CM | POA: Diagnosis not present

## 2020-02-02 DIAGNOSIS — R5381 Other malaise: Secondary | ICD-10-CM | POA: Diagnosis not present

## 2020-02-02 DIAGNOSIS — Z66 Do not resuscitate: Secondary | ICD-10-CM | POA: Diagnosis present

## 2020-02-02 DIAGNOSIS — R0600 Dyspnea, unspecified: Secondary | ICD-10-CM

## 2020-02-02 DIAGNOSIS — E569 Vitamin deficiency, unspecified: Secondary | ICD-10-CM | POA: Diagnosis not present

## 2020-02-02 DIAGNOSIS — R0902 Hypoxemia: Secondary | ICD-10-CM | POA: Diagnosis not present

## 2020-02-02 DIAGNOSIS — J90 Pleural effusion, not elsewhere classified: Secondary | ICD-10-CM | POA: Diagnosis not present

## 2020-02-02 DIAGNOSIS — H9190 Unspecified hearing loss, unspecified ear: Secondary | ICD-10-CM | POA: Diagnosis not present

## 2020-02-02 DIAGNOSIS — J189 Pneumonia, unspecified organism: Secondary | ICD-10-CM | POA: Diagnosis present

## 2020-02-02 DIAGNOSIS — E876 Hypokalemia: Secondary | ICD-10-CM | POA: Diagnosis present

## 2020-02-02 DIAGNOSIS — I5030 Unspecified diastolic (congestive) heart failure: Secondary | ICD-10-CM | POA: Diagnosis not present

## 2020-02-02 DIAGNOSIS — Z9181 History of falling: Secondary | ICD-10-CM

## 2020-02-02 DIAGNOSIS — K5900 Constipation, unspecified: Secondary | ICD-10-CM | POA: Diagnosis not present

## 2020-02-02 DIAGNOSIS — I429 Cardiomyopathy, unspecified: Secondary | ICD-10-CM | POA: Diagnosis present

## 2020-02-02 DIAGNOSIS — F32A Depression, unspecified: Secondary | ICD-10-CM | POA: Diagnosis not present

## 2020-02-02 LAB — URINALYSIS, COMPLETE (UACMP) WITH MICROSCOPIC
Bilirubin Urine: NEGATIVE
Glucose, UA: NEGATIVE mg/dL
Hgb urine dipstick: NEGATIVE
Ketones, ur: NEGATIVE mg/dL
Nitrite: NEGATIVE
Protein, ur: NEGATIVE mg/dL
Specific Gravity, Urine: 1.008 (ref 1.005–1.030)
WBC, UA: >50 WBC/hpf — ABNORMAL HIGH (ref 0–5)
pH: 7 (ref 5.0–8.0)

## 2020-02-02 LAB — CBC WITH DIFFERENTIAL/PLATELET
Abs Immature Granulocytes: 0.09 10*3/uL — ABNORMAL HIGH (ref 0.00–0.07)
Basophils Absolute: 0 10*3/uL (ref 0.0–0.1)
Basophils Relative: 0 %
Eosinophils Absolute: 0 10*3/uL (ref 0.0–0.5)
Eosinophils Relative: 0 %
HCT: 42.9 % (ref 39.0–52.0)
Hemoglobin: 15.8 g/dL (ref 13.0–17.0)
Immature Granulocytes: 1 %
Lymphocytes Relative: 10 %
Lymphs Abs: 1.3 10*3/uL (ref 0.7–4.0)
MCH: 35.2 pg — ABNORMAL HIGH (ref 26.0–34.0)
MCHC: 36.8 g/dL — ABNORMAL HIGH (ref 30.0–36.0)
MCV: 95.5 fL (ref 80.0–100.0)
Monocytes Absolute: 1.5 10*3/uL — ABNORMAL HIGH (ref 0.1–1.0)
Monocytes Relative: 11 %
Neutro Abs: 10.6 10*3/uL — ABNORMAL HIGH (ref 1.7–7.7)
Neutrophils Relative %: 78 %
Platelets: 254 10*3/uL (ref 150–400)
RBC: 4.49 MIL/uL (ref 4.22–5.81)
RDW: 11.9 % (ref 11.5–15.5)
WBC: 13.6 10*3/uL — ABNORMAL HIGH (ref 4.0–10.5)
nRBC: 0 % (ref 0.0–0.2)

## 2020-02-02 LAB — RESPIRATORY PANEL BY RT PCR (FLU A&B, COVID)
Influenza A by PCR: NEGATIVE
Influenza B by PCR: NEGATIVE
SARS Coronavirus 2 by RT PCR: NEGATIVE

## 2020-02-02 LAB — COMPREHENSIVE METABOLIC PANEL
ALT: 28 U/L (ref 0–44)
AST: 55 U/L — ABNORMAL HIGH (ref 15–41)
Albumin: 4.2 g/dL (ref 3.5–5.0)
Alkaline Phosphatase: 91 U/L (ref 38–126)
Anion gap: 20 — ABNORMAL HIGH (ref 5–15)
BUN: 49 mg/dL — ABNORMAL HIGH (ref 8–23)
CO2: 33 mmol/L — ABNORMAL HIGH (ref 22–32)
Calcium: 9.5 mg/dL (ref 8.9–10.3)
Chloride: 69 mmol/L — ABNORMAL LOW (ref 98–111)
Creatinine, Ser: 1.21 mg/dL (ref 0.61–1.24)
GFR, Estimated: 50 mL/min — ABNORMAL LOW (ref >60)
Glucose, Bld: 183 mg/dL — ABNORMAL HIGH (ref 70–99)
Potassium: 2.8 mmol/L — ABNORMAL LOW (ref 3.5–5.1)
Sodium: 122 mmol/L — ABNORMAL LOW (ref 135–145)
Total Bilirubin: 1.9 mg/dL — ABNORMAL HIGH (ref 0.3–1.2)
Total Protein: 7.9 g/dL (ref 6.5–8.1)

## 2020-02-02 LAB — TROPONIN I (HIGH SENSITIVITY)
Troponin I (High Sensitivity): 45 ng/L — ABNORMAL HIGH (ref <18)
Troponin I (High Sensitivity): 46 ng/L — ABNORMAL HIGH (ref <18)

## 2020-02-02 LAB — OSMOLALITY, URINE: Osmolality, Ur: 282 mOsm/kg — ABNORMAL LOW (ref 300–900)

## 2020-02-02 LAB — MAGNESIUM: Magnesium: 2.4 mg/dL (ref 1.7–2.4)

## 2020-02-02 LAB — SODIUM, URINE, RANDOM: Sodium, Ur: 29 mmol/L

## 2020-02-02 MED ORDER — SODIUM CHLORIDE 0.9 % IV SOLN
1.0000 g | Freq: Once | INTRAVENOUS | Status: AC
  Administered 2020-02-02: 1 g via INTRAVENOUS
  Filled 2020-02-02: qty 10

## 2020-02-02 MED ORDER — FERROUS SULFATE 325 (65 FE) MG PO TABS
325.0000 mg | ORAL_TABLET | Freq: Every day | ORAL | Status: DC
  Administered 2020-02-03 – 2020-02-08 (×6): 325 mg via ORAL
  Filled 2020-02-02 (×6): qty 1

## 2020-02-02 MED ORDER — TAMSULOSIN HCL 0.4 MG PO CAPS
0.4000 mg | ORAL_CAPSULE | Freq: Every day | ORAL | Status: DC
  Administered 2020-02-03 – 2020-02-08 (×6): 0.4 mg via ORAL
  Filled 2020-02-02 (×6): qty 1

## 2020-02-02 MED ORDER — SODIUM CHLORIDE 0.9 % IV SOLN: Freq: Once | INTRAVENOUS | Status: AC

## 2020-02-02 MED ORDER — TORSEMIDE 20 MG PO TABS
20.0000 mg | ORAL_TABLET | Freq: Two times a day (BID) | ORAL | Status: DC
  Administered 2020-02-03 – 2020-02-08 (×11): 20 mg via ORAL
  Filled 2020-02-02 (×11): qty 1

## 2020-02-02 MED ORDER — POTASSIUM CHLORIDE CRYS ER 20 MEQ PO TBCR
40.0000 meq | EXTENDED_RELEASE_TABLET | Freq: Once | ORAL | Status: AC
  Administered 2020-02-02: 40 meq via ORAL
  Filled 2020-02-02: qty 2

## 2020-02-02 MED ORDER — POTASSIUM CHLORIDE 10 MEQ/100ML IV SOLN
10.0000 meq | INTRAVENOUS | Status: AC
  Administered 2020-02-02 – 2020-02-03 (×3): 10 meq via INTRAVENOUS
  Filled 2020-02-02 (×3): qty 100

## 2020-02-02 MED ORDER — SODIUM CHLORIDE 0.9 % IV SOLN
500.0000 mg | Freq: Once | INTRAVENOUS | Status: AC
  Administered 2020-02-02: 500 mg via INTRAVENOUS
  Filled 2020-02-02: qty 500

## 2020-02-02 MED ORDER — ACETAMINOPHEN 325 MG PO TABS
650.0000 mg | ORAL_TABLET | Freq: Three times a day (TID) | ORAL | Status: DC
  Administered 2020-02-02 – 2020-02-08 (×16): 650 mg via ORAL
  Filled 2020-02-02 (×17): qty 2

## 2020-02-02 MED ORDER — ENOXAPARIN SODIUM 40 MG/0.4ML ~~LOC~~ SOLN
40.0000 mg | SUBCUTANEOUS | Status: DC
  Administered 2020-02-02 – 2020-02-07 (×6): 40 mg via SUBCUTANEOUS
  Filled 2020-02-02 (×6): qty 0.4

## 2020-02-02 NOTE — ED Notes (Signed)
Pt requested to call grandson Thurmond Butts. Update given to grandson and pt given phone to speak with family.

## 2020-02-02 NOTE — H&P (Signed)
History and Physical    [name] VOZ:[id number] DOB: [birth year] DOA: 02/02/2020  PCP: Birdie Sons, MD   Patient coming from: Home  I have personally briefly reviewed patient's old medical records in [place]  Chief Complaint: Weakness  HPI: Douglas Calhoun is a 86 y.o. male with medical history significant for atrial fibrillation on Eliquis, discontinued on 02/01/2020 by cardiologist due to recent falls, systolic heart failure with last EF 25% on 02/03/2019, sick sinus syndrome status post pacemaker on 9/20, seen by his cardiologist on 02/01/2020 for medication management due to recent fluid retention.  Patient apparently had worsening edema at his previous visit and had an increase in his torsemide with addition of metolazone but since then has been having hypokalemia.  Prior to his visit on 10/8 he had several telephone visits with reports of weakness and falls.  On his visit on 10/8, his metolazone was discontinued, heart failure meds were held due to low blood pressures, torsemide decreased and Eliquis discontinued in view of falls and unsteady gait.  On the day of arrival, 10/9 patient presents due to protracted weakness with inability getting out of bed.  His neighbor heard him shouting out for pain and called EMS.  EMS reported BP 108/56 pulse 68 O2 sat 87 on room air improving to 94% on 2 L.  Patient denies chest pain, shortness of breath, cough, fever or chills.  Denies abdominal pain, nausea vomiting or change in bowel habits or dysuria.  Denies one-sided weakness numbness or tingling or headache or visual disturbance. ED Course: On arrival in the ED he was afebrile, borderline hypotensive at 104/63, heart rate 82 O2 sat 92% on O2 at 2 L, mild tachypnea at 21.  Blood work significant for sodium of 122, potassium 2.8, mild LFT elevation with AST 55, total bili 1.9.  Troponin 45.  WBC elevated at 13600.  Urinalysis showed large leukocyte esterase but otherwise negative.  Covid  PCR negativeChest x-ray showed left basilar focal pulmonary infiltrate likely infectious or inflammatory. EKG as reviewed by me : A. fib with rate of 79 and multiple PVCs Patient was started on normal saline, oral potassium supplementation.  Hospitalist consulted for admission.  Review of Systems: As per HPI otherwise all other systems on review of systems negative.    Past Medical History:  Diagnosis Date  . Achilles tendinitis   . Cardiomyopathy ([place])    a. 01/2019 Echo: EF 25%, glob HK.  . Cataract   . Chronic HFrEF (heart failure with reduced ejection fraction) ([place])    a. 03/2018 Echo: EF 45-50%); b. 01/2019 Echo: EF 25%. Glob HK. Triv AI/PR, Mod TR. RVSP 57.30mmHg.  Marland Kitchen Hearing loss   . Herpes zoster without complication 08/18/9561  . Permanent atrial fibrillation (HCC)    a. CHA2DS2VASc = 4-->Eliquis.  . Personal history of prostate cancer 10/08/2014   s/p 15 radiation treatments treated by Dr. Madelin Headings   . SSS (sick sinus syndrome) (HCC)    a. s/p MDT Micra AV leadless PPM (ser # [id number]).  . Varicose vein of leg     Past Surgical History:  Procedure Laterality Date  . APPENDECTOMY  1960's  . BREAST SURGERY     Breast Biopsy prior to 1960  . CATARACT EXTRACTION Left   . PACEMAKER LEADLESS INSERTION N/A 01/11/2019   Procedure: PACEMAKER LEADLESS INSERTION;  Surgeon: Isaias Cowman, MD;  Location: [facility];  Service: Cardiovascular;  Laterality: N/A;     reports that he  has never smoked. He has never used smokeless tobacco. He reports previous alcohol use. He reports that he does not use drugs.  No Known Allergies  Family History  Problem Relation Age of Onset  . Heart disease Mother   . Stroke Mother   . Alcoholism Son       Prior to Admission medications   Medication Sig Start Date End Date Taking? Authorizing Provider  acetaminophen (TYLENOL) 325 MG tablet Take 650 mg by mouth 3 (three) times daily.    [provider]  amitriptyline  (ELAVIL) 25 MG tablet Take 25 mg by mouth at bedtime. 05/18/19   [provider]  Ascorbic Acid (VITAMIN C) 1000 MG tablet Take 1,000 mg by mouth daily.    [provider]  Calcium Carbonate-Vitamin D (CALCIUM 500 + D PO) Take 1 tablet by mouth daily.    [provider]  cholecalciferol (VITAMIN D3) 25 MCG (1000 UT) tablet Take 2,000 Units by mouth daily.     [provider]  ferrous sulfate 325 (65 FE) MG EC tablet Take 325 mg by mouth daily with breakfast.    [provider]  gabapentin (NEURONTIN) 100 MG capsule TAKE 1 CAPSULE IN THE MORNING AND TWO AT NIGHT 04/05/19   Birdie Sons, MD  Multiple Vitamins-Minerals (MULTIVITAMIN ADULT PO) Take 1 tablet by mouth daily. 07/17/08   [provider]  Polyethylene Glycol 3350 (MIRALAX PO) Take 1 Dose by mouth daily. Heaping teaspoon    [provider]  potassium chloride (KLOR-CON) 10 MEQ tablet Take 2-4 tablets (20-40 mEq total) by mouth as directed. Take 4 tablets (40 mEq) daily for 5 days. Then go to 2 tablets (20 mEq) daily. 02/01/20   Kate Sable, MD  Psyllium (METAMUCIL PO) Take 1 Dose by mouth daily.    [provider]  pyridOXINE (VITAMIN B-6) 100 MG tablet Take 100 mg by mouth daily.    [provider]  tamsulosin (FLOMAX) 0.4 MG CAPS capsule Take 0.4 mg by mouth daily after breakfast.     [provider]  torsemide (DEMADEX) 20 MG tablet Take 40 mg (2 tablets) daily. Patient taking differently: Take 40 mg (2 tablets) in the morning and 1 tablet in the afternoon. 01/08/20   Theora Gianotti, NP  Vitamin E 400 units TABS Take 1 tablet by mouth daily. 07/17/08   [provider]    Physical Exam: Vitals:   02/02/20 1943 02/02/20 2000 02/02/20 2030 02/02/20 2100  BP:  105/81 (!) 105/94 95/65  Pulse:  70 65 75  Resp:  (!) 21 16 15   Temp: 97.8 F (36.6 C)     TempSrc: Oral     SpO2:  96% 97% 96%  Weight:      Height:          Vitals:   02/02/20 1943 02/02/20 2000 02/02/20 2030 02/02/20 2100  BP:  105/81 (!) 105/94 95/65  Pulse:  70 65 75  Resp:  (!) 21 16 15   Temp: 97.8 F (36.6 C)     TempSrc: Oral     SpO2:  96% 97% 96%  Weight:      Height:          Constitutional: Alert and oriented x 3 .  Appears chronically ill and weak HEENT:      Head: Normocephalic and atraumatic.         Eyes: PERLA, EOMI, Conjunctivae are normal. Sclera is non-icteric.       Mouth/Throat:  Mucous membranes are moist.       Neck: Supple with no signs of meningismus. Cardiovascular: Regular rate and rhythm. No murmurs, gallops, or rubs. 2+ symmetrical distal pulses are present . No JVD. 1-2+LE edema Respiratory: Respiratory effort normal .Lungs sounds diminished bilaterally. No wheezes, crackles, or rhonchi.  Gastrointestinal: Soft, non tender, and non distended with positive bowel sounds. No rebound or guarding. Genitourinary: No CVA tenderness. Musculoskeletal: Nontender with normal range of motion in all extremities. No cyanosis, or erythema of extremities. Neurologic:  Face is symmetric. Moving all extremities. No gross focal neurologic deficits . Skin: Skin is warm, dry.  No rash or ulcers Psychiatric: Mood and affect are normal    Labs on Admission: I have personally reviewed following labs and imaging studies  CBC: Recent Labs  Lab 02/02/20 1959  WBC 13.6*  NEUTROABS 10.6*  HGB 15.8  HCT 42.9  MCV 95.5  PLT 809   Basic Metabolic Panel: Recent Labs  Lab 01/29/20 1349 02/02/20 1959  NA 122* 122*  K 2.4* 2.8*  CL 70* 69*  CO2 33* 33*  GLUCOSE 149* 183*  BUN 79* 49*  CREATININE 1.22 1.21  CALCIUM 9.5 9.5   GFR: Estimated Creatinine Clearance: 33.8 mL/min (by C-G formula based on SCr of 1.21 mg/dL). Liver Function Tests: Recent Labs  Lab 02/02/20 1959  AST 55*  ALT 28  ALKPHOS 91  BILITOT 1.9*  PROT 7.9  ALBUMIN 4.2   No results for input(s): LIPASE, AMYLASE in the last 168 hours. No  results for input(s): AMMONIA in the last 168 hours. Coagulation Profile: No results for input(s): INR, PROTIME in the last 168 hours. Cardiac Enzymes: No results for input(s): CKTOTAL, CKMB, CKMBINDEX, TROPONINI in the last 168 hours. BNP (last 3 results) No results for input(s): PROBNP in the last 8760 hours. HbA1C: No results for input(s): HGBA1C in the last 72 hours. CBG: No results for input(s): GLUCAP in the last 168 hours. Lipid Profile: No results for input(s): CHOL, HDL, LDLCALC, TRIG, CHOLHDL, LDLDIRECT in the last 72 hours. Thyroid Function Tests: No results for input(s): TSH, T4TOTAL, FREET4, T3FREE, THYROIDAB in the last 72 hours. Anemia Panel: No results for input(s): VITAMINB12, FOLATE, FERRITIN, TIBC, IRON, RETICCTPCT in the last 72 hours. Urine analysis:    Component Value Date/Time   COLORURINE YELLOW (A) 02/02/2020 2117   APPEARANCEUR HAZY (A) 02/02/2020 2117   APPEARANCEUR Clear 04/02/2019 1448   LABSPEC 1.008 02/02/2020 2117   PHURINE 7.0 02/02/2020 2117   GLUCOSEU NEGATIVE 02/02/2020 2117   HGBUR NEGATIVE 02/02/2020 2117   BILIRUBINUR NEGATIVE 02/02/2020 2117   BILIRUBINUR Negative 04/02/2019 [place] 02/02/2020 2117   PROTEINUR NEGATIVE 02/02/2020 2117   UROBILINOGEN 0.2 08/14/2018 1436   NITRITE NEGATIVE 02/02/2020 2117   LEUKOCYTESUR LARGE (A) 02/02/2020 2117    Radiological Exams on Admission: DG Chest Portable 1 View  Result Date: 02/02/2020 CLINICAL DATA:  Hypoxia EXAM: PORTABLE CHEST 1 VIEW COMPARISON:  12/13/2019 FINDINGS: There is focal pulmonary infiltrate noted peripherally within the left lung base, likely infectious or inflammatory in the acute setting. The lungs are otherwise clear. No pneumothorax or pleural effusion. Cardiac size is at the upper limits of normal, stable since prior examination. Pulmonary vascularity is normal. No acute bone abnormality. IMPRESSION: Left basilar focal pulmonary infiltrate, likely infectious  or inflammatory. Electronically Signed   By: Fidela Salisbury MD   On: 02/02/2020 20:26     Assessment/Plan 86 year old male with history of atrial fibrillation on Eliquis,  discontinued on 02/01/2020 by cardiologist due to recent falls, systolic heart failure with last EF 25% on 02/03/2019, sick sinus syndrome status post pacemaker on 9/20, presenting with intractable weakness and frequent falls.  Recently had intensification of diuretic regimen by primary cardiologist    Generalized weakness Frequent falls -Likely secondary to a combination of electrolyte abnormalities, hypotension related to recent intensification of diuretic therapy for management of fluid overload in the outpatient setting, and general physical deconditioning, and pneumonia seen on chest x-ray -Correct electrolyte disturbance -Optimize medical management of CHF    Hyponatremia, acute on chronic -Sodium 122.  Was 68 at primary cardiologist 4 days prior and 130 30 days ago -Could be hypotonic related to fluid versus hypotonic related to intensified diuretic treatment over the past month -Follow serum and urine osmolality and urine sodium -Given hypotension will continue IV saline until osmolality results    Hypokalemia -Sodium 2.8 -Oral and IV supplementation  Elevated troponin -Suspect demand.  Troponin 45 but no acute ST-T wave changes and patient denies chest pain -Continue to trend to peak  CAP (community acquired pneumonia) Hypoxia -Chest x-ray showing possible pneumonia.  Covid and flu negative -Rocephin and azithromycin -O2 sats 87% with EMS requiring 2 L to maintain sats of 92 -Supplemental oxygen to keep sats over 92    Chronic systolic heart failure ([place]), last EF 25% 10/20 -Not acutely exacerbated -Patient had all CHF meds except for torsemide discontinued by primary cardiologist during office visit on 10/8 -Consider cardiology consult in the a.m. for medication management    Permanent atrial  fibrillation ([place]) -Currently off all meds per review of cardiology consult note from 10/8 -Consider cardiology consult for assistance with medication management    Status cardiac pacemaker -No acute disease suspected    DVT prophylaxis: Lovenox( Code Status: full code  Family Communication:  none  Disposition Plan: Back to previous home environment Consults called: none  Status:At the time of admission, it appears that the appropriate admission status for this patient is INPATIENT. This is judged to be reasonable and necessary in order to provide the required intensity of service to ensure the patient's safety given the presenting symptoms, physical exam findings, and initial radiographic and laboratory data in the context of their  Comorbid conditions.   Patient requires inpatient status due to high intensity of service, high risk for further deterioration and high frequency of surveillance required.   I certify that at the point of admission it is my clinical judgment that the patient will require inpatient hospital care spanning beyond [place] MD [facility]     02/02/2020, 9:55 PM

## 2020-02-02 NOTE — ED Provider Notes (Signed)
[facility] Emergency Department Provider Note   ____________________________________________   I have reviewed the triage vital signs and the nursing notes.   HISTORY  Chief Complaint Weakness   History limited by: Not Limited   HPI Douglas Calhoun is a 86 y.o. male who presents to the emergency department today because of concerns for weakness.  Patient has been feeling weak for the past 2 days.  Apparently today he was so weak he could not get himself up out of bed.  Neighbors heard him calling out.  The patient denies any focal weakness.  Upon EMS arrival they found patient to be slightly hypoxic on room air.  The patient does not complain of any shortness of breath or chest pain.  Denies any headache.   Records reviewed. Per medical record review patient has a history of CHF. Recent blood work that showed hyponatremia.   Past Medical History:  Diagnosis Date  . Achilles tendinitis   . Cardiomyopathy ([place])    a. 01/2019 Echo: EF 25%, glob HK.  . Cataract   . Chronic HFrEF (heart failure with reduced ejection fraction) ([place])    a. 03/2018 Echo: EF 45-50%); b. 01/2019 Echo: EF 25%. Glob HK. Triv AI/PR, Mod TR. RVSP 57.60mmHg.  Marland Kitchen Hearing loss   . Herpes zoster without complication 2/58/5277  . Permanent atrial fibrillation (HCC)    a. CHA2DS2VASc = 4-->Eliquis.  . Personal history of prostate cancer 10/08/2014   s/p 15 radiation treatments treated by Dr. Madelin Headings   . SSS (sick sinus syndrome) (HCC)    a. s/p MDT Micra AV leadless PPM (ser # [id number]).  . Varicose vein of leg     Patient Active Problem List   Diagnosis Date Noted  . Compression fracture of lumbar spine, non-traumatic ([place]) 03/27/2019  . Hyponatremia with excess extracellular fluid volume 03/02/2019  . SOBOE (shortness of breath on exertion) 01/19/2019  . Sick sinus syndrome ([place]) 01/11/2019  . Chronic systolic heart failure ([place]) 04/17/2018  . Atrial fibrillation ([place]) 04/17/2018   . Acute exacerbation of CHF (congestive heart failure) ([employer]) 03/29/2018  . Strain of rotator cuff capsule 11/07/2017  . Edema 10/09/2014  . Anemia 10/08/2014  . PVC (premature ventricular contraction) 10/08/2014  . Urinary incontinence 10/08/2014  . Varicose veins of legs 10/08/2014  . Venous insufficiency 10/08/2014  . Lipoma of skin, face 06/24/2008    Past Surgical History:  Procedure Laterality Date  . APPENDECTOMY  1960's  . BREAST SURGERY     Breast Biopsy prior to 1960  . CATARACT EXTRACTION Left   . PACEMAKER LEADLESS INSERTION N/A 01/11/2019   Procedure: PACEMAKER LEADLESS INSERTION;  Surgeon: Isaias Cowman, MD;  Location: [facility];  Service: Cardiovascular;  Laterality: N/A;    Prior to Admission medications   Medication Sig Start Date End Date Taking? Authorizing Provider  acetaminophen (TYLENOL) 325 MG tablet Take 650 mg by mouth 3 (three) times daily.    [provider]  amitriptyline (ELAVIL) 25 MG tablet Take 25 mg by mouth at bedtime. 05/18/19   [provider]  Ascorbic Acid (VITAMIN C) 1000 MG tablet Take 1,000 mg by mouth daily.    [provider]  Calcium Carbonate-Vitamin D (CALCIUM 500 + D PO) Take 1 tablet by mouth daily.    [provider]  cholecalciferol (VITAMIN D3) 25 MCG (1000 UT) tablet Take 2,000 Units by mouth daily.     [provider]  ferrous sulfate 325 (65 FE) MG EC  tablet Take 325 mg by mouth daily with breakfast.    [provider]  gabapentin (NEURONTIN) 100 MG capsule TAKE 1 CAPSULE IN THE MORNING AND TWO AT NIGHT 04/05/19   Birdie Sons, MD  Multiple Vitamins-Minerals (MULTIVITAMIN ADULT PO) Take 1 tablet by mouth daily. 07/17/08   [provider]  Polyethylene Glycol 3350 (MIRALAX PO) Take 1 Dose by mouth daily. Heaping teaspoon    [provider]  potassium chloride (KLOR-CON) 10 MEQ tablet Take 2-4 tablets (20-40 mEq total) by mouth as directed.  Take 4 tablets (40 mEq) daily for 5 days. Then go to 2 tablets (20 mEq) daily. 02/01/20   Kate Sable, MD  Psyllium (METAMUCIL PO) Take 1 Dose by mouth daily.    [provider]  pyridOXINE (VITAMIN B-6) 100 MG tablet Take 100 mg by mouth daily.    [provider]  tamsulosin (FLOMAX) 0.4 MG CAPS capsule Take 0.4 mg by mouth daily after breakfast.     [provider]  torsemide (DEMADEX) 20 MG tablet Take 40 mg (2 tablets) daily. Patient taking differently: Take 40 mg (2 tablets) in the morning and 1 tablet in the afternoon. 01/08/20   Theora Gianotti, NP  Vitamin E 400 units TABS Take 1 tablet by mouth daily. 07/17/08   [provider]    Allergies Patient has no known allergies.  Family History  Problem Relation Age of Onset  . Heart disease Mother   . Stroke Mother   . Alcoholism Son     Social History Social History   Tobacco Use  . Smoking status: Never Smoker  . Smokeless tobacco: Never Used  Vaping Use  . Vaping Use: Never used  Substance Use Topics  . Alcohol use: Not Currently    Alcohol/week: 0.0 standard drinks  . Drug use: No    Review of Systems Constitutional: No fever/chills. Positive for generalized weakness.  Eyes: No visual changes. ENT: No sore throat. Cardiovascular: Denies chest pain. Respiratory: Denies shortness of breath. Gastrointestinal: No abdominal pain.  No nausea, no vomiting.  No diarrhea.   Genitourinary: Negative for dysuria. Musculoskeletal: Negative for back pain. Skin: Negative for rash. Neurological: Negative for headaches, focal weakness or numbness.  ____________________________________________   PHYSICAL EXAM:  VITAL SIGNS: ED Triage Vitals  Enc Vitals Group     BP 02/02/20 1940 104/63     Pulse Rate 02/02/20 1940 82     Resp 02/02/20 1940 18     Temp 02/02/20 1943 97.8 F (36.6 C)     Temp Source 02/02/20 1943 Oral     SpO2 02/02/20 1940 92 %     Weight 02/02/20 1943  180 lb (81.6 kg)     Height 02/02/20 1943 5\' 8"  (1.727 m)     Head Circumference --      Peak Flow --      Pain Score 02/02/20 1942 0   Constitutional: Alert and oriented.  Eyes: Conjunctivae are normal.  ENT      Head: Normocephalic and atraumatic.      Nose: No congestion/rhinnorhea.      Mouth/Throat: Mucous membranes are moist.      Neck: No stridor. Hematological/Lymphatic/Immunilogical: No cervical lymphadenopathy. Cardiovascular: Normal rate, regular rhythm.  No murmurs, rubs, or gallops.  Respiratory: Normal respiratory effort without tachypnea nor retractions. Breath sounds are clear and equal bilaterally. No wheezes/rales/rhonchi. Gastrointestinal: Soft and non tender. No rebound. No guarding.  Genitourinary: Deferred Musculoskeletal: Normal range of motion in all  extremities. No lower extremity edema. Neurologic:  Normal speech and language. No gross focal neurologic deficits are appreciated.  Skin:  Skin is warm, dry and intact. No rash noted. Psychiatric: Mood and affect are normal. Speech and behavior are normal. Patient exhibits appropriate insight and judgment.  ____________________________________________    LABS (pertinent positives/negatives)  Trop hs 45 CBC wbc 13.6, hgb 15.8, plt 254 CMP na 122, k 2.8, glu 183, cr .121  ____________________________________________   EKG  I, Nance Pear, attending physician, personally viewed and interpreted this EKG  EKG Time: 1936 Rate: 79 Rhythm: atrial fibrillation Axis: left axis deviation Intervals: qtc 504 QRS: RBBB ST changes: no st elevation Impression: abnormal ekg  ____________________________________________    RADIOLOGY  CXR Left basilar focal pulmonary infiltrate  ____________________________________________   PROCEDURES  Procedures  ____________________________________________   INITIAL IMPRESSION / ASSESSMENT AND PLAN / ED COURSE  Pertinent labs & imaging results that were  available during my care of the patient were reviewed by me and considered in my medical decision making (see chart for details).   Patient presented to the emergency department today because of concerns for generalized weakness that has been progressive over the past couple of days.  Patient's work-up here does show hyponatremia and hypokalemia.  Additionally patient was found to be hypoxic by EMS.  Chest x-ray here is consistent with possible pneumonia and patient does have mild leukocytosis.  Will initiate treatment for pneumonia as well as start patient on IV fluids.  Will plan on admission to the hospital service.  ____________________________________________   FINAL CLINICAL IMPRESSION(S) / ED DIAGNOSES  Final diagnoses:  Weakness  Hyponatremia  Hypokalemia  Pneumonia due to infectious organism, unspecified laterality, unspecified part of lung     Note: This dictation was prepared with Dragon dictation. Any transcriptional errors that result from this process are unintentional     Nance Pear, MD 02/02/20 2108

## 2020-02-02 NOTE — ED Triage Notes (Signed)
Pt arrives to ED from home via [facility] with c/c of generalized weakness. Pt lives alone and neighbor called EMS in response to pt calling out. Pt states he was unable to get out of bed, last felt normal approx 2 days ago. EMS reports transport vitals of 108/56, p68, afib noted, O2 sat initially at 87%, improved to 94% on 2L via nasal canula. Upon arrival, pt A&Ox4, NAD.

## 2020-02-03 ENCOUNTER — Other Ambulatory Visit: Payer: Self-pay

## 2020-02-03 DIAGNOSIS — J189 Pneumonia, unspecified organism: Secondary | ICD-10-CM | POA: Diagnosis not present

## 2020-02-03 DIAGNOSIS — E871 Hypo-osmolality and hyponatremia: Secondary | ICD-10-CM

## 2020-02-03 DIAGNOSIS — R531 Weakness: Secondary | ICD-10-CM | POA: Diagnosis not present

## 2020-02-03 LAB — BASIC METABOLIC PANEL
Anion gap: 12 (ref 5–15)
BUN: 42 mg/dL — ABNORMAL HIGH (ref 8–23)
CO2: 35 mmol/L — ABNORMAL HIGH (ref 22–32)
Calcium: 8.7 mg/dL — ABNORMAL LOW (ref 8.9–10.3)
Chloride: 78 mmol/L — ABNORMAL LOW (ref 98–111)
Creatinine, Ser: 1.08 mg/dL (ref 0.61–1.24)
GFR, Estimated: 57 mL/min — ABNORMAL LOW (ref >60)
Glucose, Bld: 102 mg/dL — ABNORMAL HIGH (ref 70–99)
Potassium: 3.1 mmol/L — ABNORMAL LOW (ref 3.5–5.1)
Sodium: 125 mmol/L — ABNORMAL LOW (ref 135–145)

## 2020-02-03 LAB — CBC
HCT: 38.1 % — ABNORMAL LOW (ref 39.0–52.0)
Hemoglobin: 14.1 g/dL (ref 13.0–17.0)
MCH: 35.8 pg — ABNORMAL HIGH (ref 26.0–34.0)
MCHC: 37 g/dL — ABNORMAL HIGH (ref 30.0–36.0)
MCV: 96.7 fL (ref 80.0–100.0)
Platelets: 222 10*3/uL (ref 150–400)
RBC: 3.94 MIL/uL — ABNORMAL LOW (ref 4.22–5.81)
RDW: 11.9 % (ref 11.5–15.5)
WBC: 10.7 10*3/uL — ABNORMAL HIGH (ref 4.0–10.5)
nRBC: 0 % (ref 0.0–0.2)

## 2020-02-03 LAB — MAGNESIUM: Magnesium: 2.3 mg/dL (ref 1.7–2.4)

## 2020-02-03 LAB — SODIUM: Sodium: 127 mmol/L — ABNORMAL LOW (ref 135–145)

## 2020-02-03 LAB — HIV ANTIBODY (ROUTINE TESTING W REFLEX): HIV Screen 4th Generation wRfx: NONREACTIVE

## 2020-02-03 LAB — OSMOLALITY: Osmolality: 271 mOsm/kg — ABNORMAL LOW (ref 275–295)

## 2020-02-03 MED ORDER — SODIUM CHLORIDE 1 G PO TABS
1.0000 g | ORAL_TABLET | Freq: Two times a day (BID) | ORAL | Status: DC
  Administered 2020-02-03 – 2020-02-08 (×10): 1 g via ORAL
  Filled 2020-02-03 (×11): qty 1

## 2020-02-03 MED ORDER — POTASSIUM CHLORIDE CRYS ER 20 MEQ PO TBCR
40.0000 meq | EXTENDED_RELEASE_TABLET | Freq: Two times a day (BID) | ORAL | Status: AC
  Administered 2020-02-03 (×2): 40 meq via ORAL
  Filled 2020-02-03 (×2): qty 2

## 2020-02-03 MED ORDER — SODIUM CHLORIDE 0.9 % IV SOLN
500.0000 mg | INTRAVENOUS | Status: AC
  Administered 2020-02-03 – 2020-02-06 (×4): 500 mg via INTRAVENOUS
  Filled 2020-02-03 (×4): qty 500

## 2020-02-03 MED ORDER — SODIUM CHLORIDE 0.9 % IV SOLN: INTRAVENOUS | Status: DC

## 2020-02-03 MED ORDER — SODIUM CHLORIDE 0.9 % IV SOLN
1.0000 g | INTRAVENOUS | Status: AC
  Administered 2020-02-03 – 2020-02-06 (×4): 1 g via INTRAVENOUS
  Filled 2020-02-03 (×3): qty 1
  Filled 2020-02-03: qty 10

## 2020-02-03 NOTE — Plan of Care (Signed)

## 2020-02-03 NOTE — Progress Notes (Signed)
PROGRESS NOTE    Douglas Calhoun  TOI:[id number] DOB: 11/28/1919 DOA: 02/02/2020 PCP: Birdie Sons, MD   Assessment & Plan:   Principal Problem:   Generalized weakness Active Problems:   PVC (premature ventricular contraction)   Chronic systolic heart failure (HCC)   Permanent atrial fibrillation (HCC)   Status cardiac pacemaker   Hyponatremia, acute on chronic   Hypokalemia   CAP (community acquired pneumonia)   Falls   Elevated troponin   Hypoxia   Generalized weakness: w/ frequent falls. Likely secondary electrolyte abnormalities, hypotension, pneumonia, & physical deconditioning. PT/OT consulted  Acute on chronic hyponatremia: etiology unclear. Started on NaCl tabs and will  continue on IVFs. Na level Q4H   Hypokalemia: KCl repleted. Will continue to monitor   Elevated troponin: likely secondary to demand ischemia   CAP: continue on IV rocephin, azithromycin. COVID19, influenza are both neg. Continue on supplemental oxygen and wean as tolerated. Encourage incentive spirometry   Chronic systolic heart failure: echo showed EF of 25%. Euvolemic. All CHF meds except for torsemide have been discontinued by primary cardiologist 02/01/20. S/p pacemaker  Chronic a. Fib: recently taken off anticoagulation secondary to high fall risk. Continue to monitor on tele     DVT prophylaxis: lovenox  Code Status: Full  Family Communication:  Disposition Plan: depends on PT/OT recs   Consultants:      Procedures:    Antimicrobials: rocephin, azithromycin    Subjective: Pt c/o difficulty hearing  Objective: Vitals:   02/03/20 0230 02/03/20 0300 02/03/20 0400 02/03/20 0451  BP: 108/66 (!) 104/58 107/68 122/65  Pulse: 63 62 (!) 59 64  Resp: 16 16  16   Temp:    98 F (36.7 C)  TempSrc:    Oral  SpO2: 94% 94%  97%  Weight:      Height:        Intake/Output Summary (Last 24 hours) at 02/03/2020 0743 Last data filed at 02/03/2020 0233 Gross per 24 hour    Intake 550 ml  Output --  Net 550 ml   Filed Weights   02/02/20 1943  Weight: 81.6 kg    Examination:  General exam: Appears uncomfortable Respiratory system: decreased breath sounds b/l. No rales, rhonchi  Cardiovascular system: irregularly irregular. No rubs, gallops or clicks.  Gastrointestinal system: Abdomen is nondistended, soft and nontender.  Normal bowel sounds heard. Central nervous system: Alert and oriented. Moves all 4 extremities  Psychiatry: Judgement and insight appear normal. Flat mood and affect     Data Reviewed: I have personally reviewed following labs and imaging studies  CBC: Recent Labs  Lab 02/02/20 1959  WBC 13.6*  NEUTROABS 10.6*  HGB 15.8  HCT 42.9  MCV 95.5  PLT 833   Basic Metabolic Panel: Recent Labs  Lab 01/29/20 1349 02/02/20 1959 02/02/20 2310 02/03/20 0542  NA 122* 122*  --  127*  K 2.4* 2.8*  --   --   CL 70* 69*  --   --   CO2 33* 33*  --   --   GLUCOSE 149* 183*  --   --   BUN 79* 49*  --   --   CREATININE 1.22 1.21  --   --   CALCIUM 9.5 9.5  --   --   MG  --   --  2.4  --    GFR: Estimated Creatinine Clearance: 33.8 mL/min (by C-G formula based on SCr of 1.21 mg/dL). Liver Function Tests: Recent Labs  Lab 02/02/20  1959  AST 55*  ALT 28  ALKPHOS 91  BILITOT 1.9*  PROT 7.9  ALBUMIN 4.2   No results for input(s): LIPASE, AMYLASE in the last 168 hours. No results for input(s): AMMONIA in the last 168 hours. Coagulation Profile: No results for input(s): INR, PROTIME in the last 168 hours. Cardiac Enzymes: No results for input(s): CKTOTAL, CKMB, CKMBINDEX, TROPONINI in the last 168 hours. BNP (last 3 results) No results for input(s): PROBNP in the last 8760 hours. HbA1C: No results for input(s): HGBA1C in the last 72 hours. CBG: No results for input(s): GLUCAP in the last 168 hours. Lipid Profile: No results for input(s): CHOL, HDL, LDLCALC, TRIG, CHOLHDL, LDLDIRECT in the last 72 hours. Thyroid Function  Tests: No results for input(s): TSH, T4TOTAL, FREET4, T3FREE, THYROIDAB in the last 72 hours. Anemia Panel: No results for input(s): VITAMINB12, FOLATE, FERRITIN, TIBC, IRON, RETICCTPCT in the last 72 hours. Sepsis Labs: No results for input(s): PROCALCITON, LATICACIDVEN in the last 168 hours.  Recent Results (from the past 240 hour(s))  Respiratory Panel by RT PCR (Flu A&B, Covid) - Nasopharyngeal Swab     Status: None   Collection Time: 02/02/20  8:55 PM   Specimen: Nasopharyngeal Swab  Result Value Ref Range Status   SARS Coronavirus 2 by RT PCR NEGATIVE NEGATIVE Final    Comment: (NOTE) SARS-CoV-2 target nucleic acids are NOT DETECTED.  The SARS-CoV-2 RNA is generally detectable in upper respiratoy specimens during the acute phase of infection. The lowest concentration of SARS-CoV-2 viral copies this assay can detect is 131 copies/mL. A negative result does not preclude SARS-Cov-2 infection and should not be used as the sole basis for treatment or other patient management decisions. A negative result may occur with  improper specimen collection/handling, submission of specimen other than nasopharyngeal swab, presence of viral mutation(s) within the areas targeted by this assay, and inadequate number of viral copies (<131 copies/mL). A negative result must be combined with clinical observations, patient history, and epidemiological information. The expected result is Negative.  Fact Sheet for Patients:  PinkCheek.be  Fact Sheet for Healthcare Providers:  GravelBags.it  This test is no t yet approved or cleared by the [place] FDA and  has been authorized for detection and/or diagnosis of SARS-CoV-2 by FDA under an Emergency Use Authorization (EUA). This EUA will remain  in effect (meaning this test can be used) for the duration of the COVID-19 declaration under Section 564(b)(1) of the Act, 21 U.S.C. section  360bbb-3(b)(1), unless the authorization is terminated or revoked sooner.     Influenza A by PCR NEGATIVE NEGATIVE Final   Influenza B by PCR NEGATIVE NEGATIVE Final    Comment: (NOTE) The Xpert Xpress SARS-CoV-2/FLU/RSV assay is intended as an aid in  the diagnosis of influenza from Nasopharyngeal swab specimens and  should not be used as a sole basis for treatment. Nasal washings and  aspirates are unacceptable for Xpert Xpress SARS-CoV-2/FLU/RSV  testing.  Fact Sheet for Patients: PinkCheek.be  Fact Sheet for Healthcare Providers: GravelBags.it  This test is not yet approved or cleared by the [place] FDA and  has been authorized for detection and/or diagnosis of SARS-CoV-2 by  FDA under an Emergency Use Authorization (EUA). This EUA will remain  in effect (meaning this test can be used) for the duration of the  Covid-19 declaration under Section 564(b)(1) of the Act, 21  U.S.C. section 360bbb-3(b)(1), unless the authorization is  terminated or revoked. Performed at [facility], [phone]  [address] [place], [place] [address]          Radiology Studies: DG Chest Portable 1 View  Result Date: 02/02/2020 CLINICAL DATA:  Hypoxia EXAM: PORTABLE CHEST 1 VIEW COMPARISON:  12/13/2019 FINDINGS: There is focal pulmonary infiltrate noted peripherally within the left lung base, likely infectious or inflammatory in the acute setting. The lungs are otherwise clear. No pneumothorax or pleural effusion. Cardiac size is at the upper limits of normal, stable since prior examination. Pulmonary vascularity is normal. No acute bone abnormality. IMPRESSION: Left basilar focal pulmonary infiltrate, likely infectious or inflammatory. Electronically Signed   By: Fidela Salisbury MD   On: 02/02/2020 20:26        Scheduled Meds: . acetaminophen  650 mg Oral TID  . enoxaparin (LOVENOX) injection  40 mg Subcutaneous Q24H  .  ferrous sulfate  325 mg Oral Q breakfast  . tamsulosin  0.4 mg Oral QPC breakfast  . torsemide  20 mg Oral BID   Continuous Infusions:   LOS: 1 day    Time spent: 32 mins     Wyvonnia Dusky, MD [facility] Pager [phone]  If 7PM-7AM, please contact night-coverage [email or web address] 02/03/2020, 7:43 AM

## 2020-02-03 NOTE — Evaluation (Signed)
Physical Therapy Evaluation Patient Details Name: Douglas Calhoun MRN: [id number] DOB: 11/22/1919 Today's Date: 02/03/2020   History of Present Illness  Patient is a 86 year old male with history of atrial fibrillation on Eliquis, discontinued on 02/01/2020 by cardiologist due to recent falls, systolic heart failure with last EF 25% on 02/03/2019, sick sinus syndrome status post pacemaker on 9/20, presenting with intractable weakness and frequent falls. Generalized weakness and frequent falls likely due to combination of electrolyte abnormalities, hypotension related, physical deconditioning, pneumonia.     Clinical Impression  Patient agreeable to PT evaluation. Patient previously lives alone in a retirement apartment per patient and family member at the bedside. Multiple falls recently at home per patient report.  Patient currently needs physical assistance for bed mobility, assistance for transfers, and Min A for steadying and with rolling walker negotiation to ambulate a short distance in room using rolling walker. Cues for safety and technique needed with all mobility. Patient fatigued quickly with activity and uses pursed lip breathing techniques at times. Sp02 decreases to mid-low 80's intermittently with activity on room air and increases back to 90's with 1-2 minutes rest break. Recommend PT to maximize independence and address functional limitations listed below. SNF is recommended for short term rehab for ongoing PT efforts before return back to living alone.     Follow Up Recommendations SNF    Equipment Recommendations  None recommended by PT    Recommendations for Other Services       Precautions / Restrictions Precautions Precautions: Fall Restrictions Weight Bearing Restrictions: No      Mobility  Bed Mobility Overal bed mobility: Needs Assistance Bed Mobility: Supine to Sit;Sit to Supine     Supine to sit: Min assist Sit to supine: Mod assist;+2 for physical  assistance   General bed mobility comments: verbal cues for sequending and technique. increased assistane required for return to bed   Transfers Overall transfer level: Needs assistance Equipment used: Rolling walker (2 wheeled) Transfers: Sit to/from Stand Sit to Stand: Min assist;+2 physical assistance         General transfer comment: patient has posterior lean during sit to stand transfers and needs faciliation for forward weight shift. +2 person for standing from bed. Patient able to stand from recliner chair with Mod A of one person. decreased eccentric control noted   Ambulation/Gait Ambulation/Gait assistance: Min assist Gait Distance (Feet): 5 Feet Assistive device: Rolling walker (2 wheeled) Gait Pattern/deviations: Trunk flexed;Narrow base of support Gait velocity: decreased    General Gait Details: patient needs Min A for steadying and occasional negotiation of rolling walker. activity tolerance limited by fatigue with activity   Stairs            Wheelchair Mobility    Modified Rankin (Stroke Patients Only)       Balance Overall balance assessment: Needs assistance Sitting-balance support: Feet supported Sitting balance-Leahy Scale: Fair     Standing balance support: Bilateral upper extremity supported;During functional activity Standing balance-Leahy Scale: Fair Standing balance comment: patient relying on rolling walker for UE support in standing                              Pertinent Vitals/Pain Pain Location: low back pain reported with movement. patient does not provide numerical value  Pain Descriptors / Indicators: Grimacing;Guarding Pain Intervention(s): Monitored during session    Home Living Family/patient expects to be discharged to:: Private residence Living Arrangements: Alone Available Help  at Discharge: Family;Available PRN/intermittently Type of Home: Apartment Home Access: Level entry       Home Equipment: Walker  - 4 wheels;Walker - 2 wheels;Shower seat Additional Comments: patient and family confirms patient lives in a retirement apartment complex with one meal provided     Prior Function Level of Independence: Needs assistance   Gait / Transfers Assistance Needed: patient uses rolling walker or rollator for ambulation   ADL's / Homemaking Assistance Needed: patient reports he is independent with ADLs prior to this admission         Hand Dominance        Extremity/Trunk Assessment   Upper Extremity Assessment Upper Extremity Assessment: Defer to OT evaluation    Lower Extremity Assessment Lower Extremity Assessment: RLE deficits/detail;LLE deficits/detail RLE Deficits / Details: grossly 4+/5 throughout. endurance impaired for sustianed activity  RLE Sensation: WNL LLE Deficits / Details: grossly 4+/5 throughout. endurance is impaired for sustianed activity  LLE Sensation: WNL       Communication   Communication: No difficulties  Cognition Arousal/Alertness: Awake/alert Behavior During Therapy: WFL for tasks assessed/performed                                   General Comments: patient is able to follow single step commands with extra time and occasional repetition       General Comments      Exercises     Assessment/Plan    PT Assessment Patient needs continued PT services  PT Problem List Decreased strength;Decreased activity tolerance;Decreased balance;Decreased mobility;Decreased knowledge of use of DME;Decreased safety awareness;Decreased knowledge of precautions;Cardiopulmonary status limiting activity       PT Treatment Interventions DME instruction;Gait training;Stair training;Therapeutic activities;Functional mobility training;Neuromuscular re-education;Therapeutic exercise;Balance training;Patient/family education    PT Goals (Current goals can be found in the Care Plan section)  Acute Rehab PT Goals Patient Stated Goal: to stay as independent  at possible  PT Goal Formulation: With patient/family Time For Goal Achievement: 02/17/20 Potential to Achieve Goals: Good    Frequency Min 2X/week   Barriers to discharge Decreased caregiver support      Co-evaluation PT/OT/SLP Co-Evaluation/Treatment: Yes Reason for Co-Treatment: To address functional/ADL transfers PT goals addressed during session: Mobility/safety with mobility         AM-PAC PT "6 Clicks" Mobility  Outcome Measure Help needed turning from your back to your side while in a flat bed without using bedrails?: A Lot Help needed moving from lying on your back to sitting on the side of a flat bed without using bedrails?: A Lot Help needed moving to and from a bed to a chair (including a wheelchair)?: A Lot Help needed standing up from a chair using your arms (e.g., wheelchair or bedside chair)?: A Lot Help needed to walk in hospital room?: A Little Help needed climbing 3-5 steps with a railing? : A Lot 6 Click Score: 13    End of Session   Activity Tolerance: Patient limited by fatigue Patient left: in bed;with call bell/phone within reach;with bed alarm set;with family/visitor present Nurse Communication:  (white board updated with mobility status ) PT Visit Diagnosis: Unsteadiness on feet (R26.81);Muscle weakness (generalized) (M62.81);Repeated falls (R29.6)    Time: 7654-6503 PT Time Calculation (min) (ACUTE ONLY): 36 min   Charges:   PT Evaluation $PT Eval Moderate Complexity: 1 Mod PT Treatments $Therapeutic Activity: 8-22 mins        Minna Merritts, PT,  MPT   Percell Locus 02/03/2020, 1:06 PM

## 2020-02-03 NOTE — Evaluation (Addendum)
Occupational Therapy Evaluation Patient Details Name: Douglas Calhoun MRN: [id number] DOB: Oct 23, 1919 Today's Date: 02/03/2020    Histor y of Present Illness Patient is a 86 year old male with history of atrial fibrillation on Eliquis, discontinued on 02/01/2020 by cardiologist due to recent falls, systolic heart failure with last EF 25% on 02/03/2019, sick sinus syndrome status post pacemaker on 9/20, presenting with intractable weakness and frequent falls. Generalized weakness and frequent falls likely due to combination of electrolyte abnormalities, hypotension related, physical deconditioning, pneumonia.    Clinical Impression   Douglas Calhoun was seen for OT evaluation this date. Prior to hospital admission, pt was MOD I for mobility and ADLs using RW/4WW, endorses 2 falls in last 3 months. Pt lives alone in [place]; [place], 1 meal provided daily and life alert pulls in apartment. Pt presents to acute OT demonstrating impaired ADL performance and functional mobility 2/2 decreased safety awareness, functional strength/ROM/balance deficits, and decreased activity tolerance. Pt currently requires MIN A don/doff gown seated EOC. MOD A for LB access seated EOB. MOD A + RW + VCs for sequencing for ADL t/f. Pt would benefit from skilled OT to address noted impairments and functional limitations (see below for any additional details) in order to maximize safety and independence while minimizing falls risk and caregiver burden. Upon hospital discharge, recommend STR to maximize pt safety and return to PLOF.     Follow Up Recommendations  SNF    Equipment Recommendations  Other (comment) (ATBD at next venue of care)    Recommendations for Other Services       Precautions / Restrictions Precautions Precautions: Fall Restrictions Weight Bearing Restrictions: No      Mobility Bed Mobility Overal bed mobility: Needs Assistance Bed Mobility: Supine to Sit;Sit to Supine     Supine  to sit: Min assist Sit to supine: Mod assist;+2 for physical assistance   General bed mobility comments: verbal cues for sequending and technique. increased assistane required for return to bed   Transfers Overall transfer level: Needs assistance Equipment used: Rolling walker (2 wheeled) Transfers: Sit to/from [facility] Sit to Stand: Min assist;+2 physical assistance Stand pivot transfers: Mod assist       General transfer comment: posterior lean standing, poor eccentric control    Balance Overall balance assessment: Needs assistance Sitting-balance support: Feet supported Sitting balance-Leahy Scale: Fair     Standing balance support: Bilateral upper extremity supported;During functional activity Standing balance-Leahy Scale: Fair Standing balance comment: heavy BUE support on RW           ADL either performed or assessed with clinical judgement   ADL Overall ADL's : Needs assistance/impaired        General ADL Comments: MIN A don/doff gown seated EOC. MOD A for LB access seated EOB. MOD A + RW for ADL t/f     Vision Baseline Vision/History: Wears glasses Wears Glasses: At all times              Pertinent Vitals/Pain Pain Assessment: Faces Faces Pain Scale: Hurts little more Pain Location: low back pain Pain Descriptors / Indicators: Grimacing;Guarding Pain Intervention(s): Limited activity within patient's tolerance;Monitored during session;Repositioned     Hand Dominance     Extremity/Trunk Assessment Upper Extremity Assessment Upper Extremity Assessment: Generalized weakness   Lower Extremity Assessment Lower Extremity Assessment: Generalized weakness RLE Deficits / Details: grossly 4+/5 throughout. endurance impaired for sustianed activity  RLE Sensation: WNL LLE Deficits / Details: grossly 4+/5 throughout. endurance is impaired  for sustianed activity  LLE Sensation: WNL       Communication Communication Communication: No  difficulties   Cognition Arousal/Alertness: Awake/alert Behavior During Therapy: WFL for tasks assessed/performed Overall Cognitive Status: No family/caregiver present to determine baseline cognitive functioning    General Comments: Follows commands c increased time and repetition   General Comments  unable to get pulse ox reading - increased WOB noted    Exercises Exercises: Other exercises Other Exercises Other Exercises: Pt educated re: OT role, DME recs, importance of mobility/OOB for functional strengthening, falls prevention, ECS Other Exercises: LBD, UBD, sup<>sit, sit<>stand, sitting/standing balance/tolerance, SPT   Shoulder Instructions      Home Living Family/patient expects to be discharged to:: Other (Comment) ([place] ) Living Arrangements: Alone Available Help at Discharge: Family;Available PRN/intermittently Type of Home: Apartment Home Access: Level entry     Home Layout: One level     Bathroom Shower/Tub: Occupational psychologist: Handicapped height Bathroom Accessibility: Yes   Home Equipment: Environmental consultant - 4 wheels;Walker - 2 wheels;Shower seat   Additional Comments: Per pt/grandson pt resident of [place], 1 meal provided daily and life alert pulls in apartment      Prior Functioning/Environment Level of Independence: Independent with assistive device(s)  Gait / Transfers Assistance Needed: patient uses rolling walker or rollator for ambulation. Pt endorses 2 falls in last 3 months.  ADL's / Homemaking Assistance Needed: patient reports he is independent with I/ADLs including driving             OT Problem List: Decreased strength;Decreased range of motion;Decreased activity tolerance;Impaired balance (sitting and/or standing);Decreased safety awareness      OT Treatment/Interventions: Self-care/ADL training;Therapeutic exercise;Energy conservation;DME and/or AE instruction;Therapeutic  activities;Patient/family education;Balance training    OT Goals(Current goals can be found in the care plan section) Acute Rehab OT Goals Patient Stated Goal: to stay as independent at possible  OT Goal Formulation: With patient Time For Goal Achievement: 02/17/20 Potential to Achieve Goals: Good ADL Goals Pt Will Perform Grooming: with min guard assist;standing (c LRAD PRN) Pt Will Perform Lower Body Dressing: with min assist;sit to/from stand (c LRAD PRN) Pt Will Transfer to Toilet: with min assist;ambulating;bedside commode (c LRAD PRN)  OT Frequency: Min 1X/week   Barriers to D/C: Decreased caregiver support          Co-evaluation PT/OT/SLP Co-Evaluation/Treatment: Yes Reason for Co-Treatment: To address functional/ADL transfers;Necessary to address cognition/behavior during functional activity PT goals addressed during session: Mobility/safety with mobility OT goals addressed during session: ADL's and self-care      AM-PAC OT "6 Clicks" Daily Activity     Outcome Measure Help from another person eating meals?: None Help from another person taking care of personal grooming?: A Little Help from another person toileting, which includes using toliet, bedpan, or urinal?: A Lot Help from another person bathing (including washing, rinsing, drying)?: A Lot Help from another person to put on and taking off regular upper body clothing?: A Little Help from another person to put on and taking off regular lower body clothing?: A Lot 6 Click Score: 16   End of Session Equipment Utilized During Treatment: Rolling walker  Activity Tolerance: Patient tolerated treatment well Patient left: in bed;with call bell/phone within reach;with family/visitor present  OT Visit Diagnosis: Other abnormalities of gait and mobility (R26.89);History of falling (Z91.81)                Time: 6283-1517 OT  Time Calculation (min): 28 min Charges:  OT General Charges $OT Visit: 1 Visit OT  Evaluation $OT Eval Moderate Complexity: 1 Mod OT Treatments $Self Care/Home Management : 8-22 mins  Dessie Coma, M.S. OTR/L  02/03/20, 2:04 PM  ascom [phone]

## 2020-02-03 NOTE — ED Notes (Signed)
Report provided to [place], Therapist, sports. Pt transported by [facility], EDT.

## 2020-02-04 DIAGNOSIS — R531 Weakness: Secondary | ICD-10-CM | POA: Diagnosis not present

## 2020-02-04 DIAGNOSIS — E871 Hypo-osmolality and hyponatremia: Secondary | ICD-10-CM | POA: Diagnosis not present

## 2020-02-04 DIAGNOSIS — J189 Pneumonia, unspecified organism: Secondary | ICD-10-CM | POA: Diagnosis not present

## 2020-02-04 LAB — BASIC METABOLIC PANEL
Anion gap: 15 (ref 5–15)
BUN: 38 mg/dL — ABNORMAL HIGH (ref 8–23)
CO2: 31 mmol/L (ref 22–32)
Calcium: 8.7 mg/dL — ABNORMAL LOW (ref 8.9–10.3)
Chloride: 83 mmol/L — ABNORMAL LOW (ref 98–111)
Creatinine, Ser: 0.92 mg/dL (ref 0.61–1.24)
GFR, Estimated: >60 mL/min (ref >60)
Glucose, Bld: 120 mg/dL — ABNORMAL HIGH (ref 70–99)
Potassium: 3.3 mmol/L — ABNORMAL LOW (ref 3.5–5.1)
Sodium: 129 mmol/L — ABNORMAL LOW (ref 135–145)

## 2020-02-04 LAB — CBC
HCT: 38.9 % — ABNORMAL LOW (ref 39.0–52.0)
Hemoglobin: 14.1 g/dL (ref 13.0–17.0)
MCH: 35.1 pg — ABNORMAL HIGH (ref 26.0–34.0)
MCHC: 36.2 g/dL — ABNORMAL HIGH (ref 30.0–36.0)
MCV: 96.8 fL (ref 80.0–100.0)
Platelets: 235 10*3/uL (ref 150–400)
RBC: 4.02 MIL/uL — ABNORMAL LOW (ref 4.22–5.81)
RDW: 12.2 % (ref 11.5–15.5)
WBC: 12.3 10*3/uL — ABNORMAL HIGH (ref 4.0–10.5)
nRBC: 0 % (ref 0.0–0.2)

## 2020-02-04 MED ORDER — POTASSIUM CHLORIDE CRYS ER 20 MEQ PO TBCR
40.0000 meq | EXTENDED_RELEASE_TABLET | Freq: Once | ORAL | Status: AC
  Administered 2020-02-04: 40 meq via ORAL
  Filled 2020-02-04: qty 2

## 2020-02-04 MED ORDER — IPRATROPIUM-ALBUTEROL 0.5-2.5 (3) MG/3ML IN SOLN: 3.0000 mL | Freq: Four times a day (QID) | RESPIRATORY_TRACT | Status: DC | PRN

## 2020-02-04 NOTE — Progress Notes (Signed)
PROGRESS NOTE    Douglas Calhoun  OIZ:[id number] DOB: [birth year] DOA: 02/02/2020 PCP: Birdie Sons, MD   Assessment & Plan:   Principal Problem:   Generalized weakness Active Problems:   PVC (premature ventricular contraction)   Chronic systolic heart failure (HCC)   Permanent atrial fibrillation (HCC)   Status cardiac pacemaker   Hyponatremia, acute on chronic   Hypokalemia   CAP (community acquired pneumonia)   Falls   Elevated troponin   Hypoxia   Generalized weakness: w/ frequent falls. Likely secondary electrolyte abnormalities, hypotension, pneumonia, & physical deconditioning. PT/OT consulted  Acute on chronic hyponatremia: etiology unclear. Continue on NaCl tabs and will  continue on IVFs. Improving.   Hypokalemia: potassium repleted. Will continue to monitor   Elevated troponin: likely secondary to demand ischemia   CAP: continue on IV rocephin, azithromycin. Continue on bronchodilators. Weaned off of supplemental oxygen. Encourage incentive spirometry.   Chronic systolic heart failure: echo showed EF of 25%. Euvolemic. All CHF meds except for torsemide have been discontinued by primary cardiologist 02/01/20. S/p pacemaker  Chronic a. fib: recently taken off of anticoagulation likely secondary to high fall risk    DVT prophylaxis: lovenox  Code Status: Full  Family Communication: discussed pt's care w/ pt's friend, Mardene Celeste, & answered her questions Disposition Plan: likely d/c to SNF  Status is: Inpatient  Remains inpatient appropriate because:IV treatments appropriate due to intensity of illness or inability to take PO   Dispo: The patient is from: Home              Anticipated d/c is to: SNF              Anticipated d/c date is: 2 days              Patient currently is not medically stable to d/c.     Consultants:      Procedures:    Antimicrobials: rocephin, azithromycin    Subjective: Pt c/o weakness  Objective: Vitals:    02/03/20 1202 02/03/20 1612 02/03/20 2103 02/04/20 0330  BP: 108/76 102/70 116/76 110/69  Pulse: 81 61 87 91  Resp: 18 17    Temp: 98 F (36.7 C) 98.2 F (36.8 C) (!) 97.4 F (36.3 C) (!) 97.5 F (36.4 C)  TempSrc: Oral Oral Oral Oral  SpO2: 97% 96% 92% 90%  Weight:    86.5 kg  Height:        Intake/Output Summary (Last 24 hours) at 02/04/2020 0726 Last data filed at 02/04/2020 0332 Gross per 24 hour  Intake 870.89 ml  Output 1350 ml  Net -479.11 ml   Filed Weights   02/02/20 1943 02/04/20 0330  Weight: 81.6 kg 86.5 kg    Examination:  General exam: Appears calm & comfortable. Hard of hearing  Respiratory system: diminished breath sounds b/l. No wheezes Cardiovascular system: Irregularly irregular. No rubs or gallops Gastrointestinal system: Abdomen is nondistended, soft and nontender.  Normal bowel sounds heard. Central nervous system: Alert and oriented. Moves all 4 extremities  Psychiatry: Judgement and insight appear normal. Flat mood and affect     Data Reviewed: I have personally reviewed following labs and imaging studies  CBC: Recent Labs  Lab 02/02/20 1959 02/03/20 0542 02/04/20 0503  WBC 13.6* 10.7* 12.3*  NEUTROABS 10.6*  --   --   HGB 15.8 14.1 14.1  HCT 42.9 38.1* 38.9*  MCV 95.5 96.7 96.8  PLT 254 222 338   Basic Metabolic Panel: Recent Labs  Lab  01/29/20 1349 02/02/20 1959 02/02/20 2310 02/03/20 0542 02/04/20 0503  NA 122* 122*  --  125*  127* 129*  K 2.4* 2.8*  --  3.1* 3.3*  CL 70* 69*  --  78* 83*  CO2 33* 33*  --  35* 31  GLUCOSE 149* 183*  --  102* 120*  BUN 79* 49*  --  42* 38*  CREATININE 1.22 1.21  --  1.08 0.92  CALCIUM 9.5 9.5  --  8.7* 8.7*  MG  --   --  2.4 2.3  --    GFR: Estimated Creatinine Clearance: 49.1 mL/min (by C-G formula based on SCr of 0.92 mg/dL). Liver Function Tests: Recent Labs  Lab 02/02/20 1959  AST 55*  ALT 28  ALKPHOS 91  BILITOT 1.9*  PROT 7.9  ALBUMIN 4.2   No results for input(s):  LIPASE, AMYLASE in the last 168 hours. No results for input(s): AMMONIA in the last 168 hours. Coagulation Profile: No results for input(s): INR, PROTIME in the last 168 hours. Cardiac Enzymes: No results for input(s): CKTOTAL, CKMB, CKMBINDEX, TROPONINI in the last 168 hours. BNP (last 3 results) No results for input(s): PROBNP in the last 8760 hours. HbA1C: No results for input(s): HGBA1C in the last 72 hours. CBG: No results for input(s): GLUCAP in the last 168 hours. Lipid Profile: No results for input(s): CHOL, HDL, LDLCALC, TRIG, CHOLHDL, LDLDIRECT in the last 72 hours. Thyroid Function Tests: No results for input(s): TSH, T4TOTAL, FREET4, T3FREE, THYROIDAB in the last 72 hours. Anemia Panel: No results for input(s): VITAMINB12, FOLATE, FERRITIN, TIBC, IRON, RETICCTPCT in the last 72 hours. Sepsis Labs: No results for input(s): PROCALCITON, LATICACIDVEN in the last 168 hours.  Recent Results (from the past 240 hour(s))  Respiratory Panel by RT PCR (Flu A&B, Covid) - Nasopharyngeal Swab     Status: None   Collection Time: 02/02/20  8:55 PM   Specimen: Nasopharyngeal Swab  Result Value Ref Range Status   SARS Coronavirus 2 by RT PCR NEGATIVE NEGATIVE Final    Comment: (NOTE) SARS-CoV-2 target nucleic acids are NOT DETECTED.  The SARS-CoV-2 RNA is generally detectable in upper respiratoy specimens during the acute phase of infection. The lowest concentration of SARS-CoV-2 viral copies this assay can detect is 131 copies/mL. A negative result does not preclude SARS-Cov-2 infection and should not be used as the sole basis for treatment or other patient management decisions. A negative result may occur with  improper specimen collection/handling, submission of specimen other than nasopharyngeal swab, presence of viral mutation(s) within the areas targeted by this assay, and inadequate number of viral copies (<131 copies/mL). A negative result must be combined with  clinical observations, patient history, and epidemiological information. The expected result is Negative.  Fact Sheet for Patients:  PinkCheek.be  Fact Sheet for Healthcare Providers:  GravelBags.it  This test is no t yet approved or cleared by the [place] FDA and  has been authorized for detection and/or diagnosis of SARS-CoV-2 by FDA under an Emergency Use Authorization (EUA). This EUA will remain  in effect (meaning this test can be used) for the duration of the COVID-19 declaration under Section 564(b)(1) of the Act, 21 U.S.C. section 360bbb-3(b)(1), unless the authorization is terminated or revoked sooner.     Influenza A by PCR NEGATIVE NEGATIVE Final   Influenza B by PCR NEGATIVE NEGATIVE Final    Comment: (NOTE) The Xpert Xpress SARS-CoV-2/FLU/RSV assay is intended as an aid in  the diagnosis of influenza  from Nasopharyngeal swab specimens and  should not be used as a sole basis for treatment. Nasal washings and  aspirates are unacceptable for Xpert Xpress SARS-CoV-2/FLU/RSV  testing.  Fact Sheet for Patients: PinkCheek.be  Fact Sheet for Healthcare Providers: GravelBags.it  This test is not yet approved or cleared by the [place] FDA and  has been authorized for detection and/or diagnosis of SARS-CoV-2 by  FDA under an Emergency Use Authorization (EUA). This EUA will remain  in effect (meaning this test can be used) for the duration of the  Covid-19 declaration under Section 564(b)(1) of the Act, 21  U.S.C. section 360bbb-3(b)(1), unless the authorization is  terminated or revoked. Performed at [facility], [address] [place], [place] [address]          Radiology Studies: DG Chest Portable 1 View  Result Date: 02/02/2020 CLINICAL DATA:  Hypoxia EXAM: PORTABLE CHEST 1 VIEW COMPARISON:  12/13/2019 FINDINGS: There is  focal pulmonary infiltrate noted peripherally within the left lung base, likely infectious or inflammatory in the acute setting. The lungs are otherwise clear. No pneumothorax or pleural effusion. Cardiac size is at the upper limits of normal, stable since prior examination. Pulmonary vascularity is normal. No acute bone abnormality. IMPRESSION: Left basilar focal pulmonary infiltrate, likely infectious or inflammatory. Electronically Signed   By: Fidela Salisbury MD   On: 02/02/2020 20:26        Scheduled Meds: . acetaminophen  650 mg Oral TID  . enoxaparin (LOVENOX) injection  40 mg Subcutaneous Q24H  . ferrous sulfate  325 mg Oral Q breakfast  . sodium chloride  1 g Oral BID WC  . tamsulosin  0.4 mg Oral QPC breakfast  . torsemide  20 mg Oral BID   Continuous Infusions: . sodium chloride 50 mL/hr at 02/03/20 1800  . azithromycin Stopped (02/03/20 1114)  . cefTRIAXone (ROCEPHIN)  IV Stopped (02/03/20 1100)     LOS: 2 days    Time spent: 33 mins     Wyvonnia Dusky, MD [facility] Pager [phone]  If 7PM-7AM, please contact night-coverage [email or web address] 02/04/2020, 7:26 AM

## 2020-02-04 NOTE — Progress Notes (Signed)
Secure chat to Dr. Jimmye Norman:  Per family, patient self caths once to twice a day per orders of his urologist Dr. Diamantina Providence. Meatus is beneath the penis per caregiver. Patient has had external catheter here and is voiding. I wanted to let you know in case you want to place any new orders.

## 2020-02-04 NOTE — Progress Notes (Signed)
Occupational Therapy Treatment Patient Details Name: Douglas Calhoun MRN: [id number] DOB: [birth year] Today's Date: 02/04/2020    History of present illness Patient is a 86 year old male with history of atrial fibrillation on Eliquis, discontinued on 02/01/2020 by cardiologist due to recent falls, systolic heart failure with last EF 25% on 02/03/2019, sick sinus syndrome status post pacemaker on 9/20, presenting with intractable weakness and frequent falls. Generalized weakness and frequent falls likely due to combination of electrolyte abnormalities, hypotension related, physical deconditioning, pneumonia.    OT comments  Pt seen for OT tx this date. Pt alert and eager to participate after finishing breakfast. Pt performed bed mobility with supervision-CGA. Seated EOB, pt noted with increased WOB. RN notified. O2 sats on room air 90%. RN cleared to continue session. Tolerated sitting EOB with intermittent UE support to brush teeth and no LOB noted. STS transfer with RW and cues for technique (initially wanting to pull up on tray table) and safety. Tolerated standing at sink to comb hair with UE support. Pt tolerated well. O2 sats after exertion 93% on room air. Pt continues to benefit from skilled OT Services. Continue to recommend SNF at this time. Will continue to progress towards OT goals.    Follow Up Recommendations  SNF    Equipment Recommendations  Other (comment) (TBD)    Recommendations for Other Services      Precautions / Restrictions Precautions Precautions: Fall Restrictions Weight Bearing Restrictions: No       Mobility Bed Mobility Overal bed mobility: Needs Assistance Bed Mobility: Supine to Sit;Sit to Supine     Supine to sit: Min guard Sit to supine: Min guard   General bed mobility comments: increased effort/time to perform  Transfers Overall transfer level: Needs assistance Equipment used: Rolling walker (2 wheeled) Transfers: Sit to/from Stand Sit to  Stand: Min assist;From elevated surface         General transfer comment: cues for sequencing/safety, initially wanting to pull up on tray table, difficulty initially with RW requiring verbal/tactile cues    Balance Overall balance assessment: Needs assistance Sitting-balance support: Feet supported;Single extremity supported;No upper extremity supported Sitting balance-Leahy Scale: Fair Sitting balance - Comments: intermittent UE support on EOB while brushing teeth but no LOB   Standing balance support: Bilateral upper extremity supported;During functional activity;Single extremity supported Standing balance-Leahy Scale: Fair Standing balance comment: difficulty with dynamic balance; able to stand with RW at sink to comb his hair without LOB                           ADL either performed or assessed with clinical judgement   ADL Overall ADL's : Needs assistance/impaired     Grooming: Oral care;Brushing hair;Min guard;Set up;Supervision/safety;Standing;Sitting Grooming Details (indicate cue type and reason): pt performed teeth brushing seated EOB with set up and supervision for safety; CGA in standing to comb hair at sink in front of mirror             Lower Body Dressing: Maximal assistance;Sitting/lateral leans Lower Body Dressing Details (indicate cue type and reason): max a for donning socks EOB, pt endorses not performing this ADL Without help in long time             Functional mobility during ADLs: Min guard;Cueing for safety;Rolling walker       Vision       Perception     Praxis      Cognition Arousal/Alertness: Awake/alert Behavior During Therapy:  WFL for tasks assessed/performed Overall Cognitive Status: No family/caregiver present to determine baseline cognitive functioning                                 General Comments: alert, oriented to self, situation, generally to place, follows commands with cues, intermittently  distracted/appeared confused        Exercises Other Exercises Other Exercises: EOB grooming and standing at sink for grooming tasks; RW mgt during standing ADL   Shoulder Instructions       General Comments pt noted with increased WOB at start of session, O2 sats on room air 90%, RN notified and clears OT to continue session; O2 sats at end of session after exertion 93%. HR WFL    Pertinent Vitals/ Pain       Pain Assessment: Faces Faces Pain Scale: Hurts even more Pain Location: low back pain (chronic), with movement worsens but only briefly Pain Descriptors / Indicators: Grimacing;Guarding;Aching Pain Intervention(s): Limited activity within patient's tolerance;Monitored during session;Repositioned  Home Living                                          Prior Functioning/Environment              Frequency  Min 1X/week        Progress Toward Goals  OT Goals(current goals can now be found in the care plan section)  Progress towards OT goals: Progressing toward goals  Acute Rehab OT Goals Patient Stated Goal: to stay as independent at possible  OT Goal Formulation: With patient Time For Goal Achievement: 02/17/20 Potential to Achieve Goals: Good  Plan Discharge plan remains appropriate;Frequency remains appropriate    Co-evaluation                 AM-PAC OT "6 Clicks" Daily Activity     Outcome Measure   Help from another person eating meals?: None Help from another person taking care of personal grooming?: A Little Help from another person toileting, which includes using toliet, bedpan, or urinal?: A Lot Help from another person bathing (including washing, rinsing, drying)?: A Lot Help from another person to put on and taking off regular upper body clothing?: A Little Help from another person to put on and taking off regular lower body clothing?: A Lot 6 Click Score: 16    End of Session Equipment Utilized During Treatment: Gait  belt;Rolling walker  OT Visit Diagnosis: Other abnormalities of gait and mobility (R26.89);History of falling (Z91.81)   Activity Tolerance Patient tolerated treatment well   Patient Left in bed;with call bell/phone within reach;with bed alarm set;with nursing/sitter in room   Nurse Communication Other (comment) (WOB, O2 sats)        Time: 1497-0263 OT Time Calculation (min): 28 min  Charges: OT General Charges $OT Visit: 1 Visit OT Treatments $Self Care/Home Management : 23-37 mins  Jeni Salles, MPH, MS, OTR/L ascom [phone] 02/04/20, 1:03 PM

## 2020-02-05 ENCOUNTER — Inpatient Hospital Stay: Payer: Medicare Other

## 2020-02-05 DIAGNOSIS — R531 Weakness: Secondary | ICD-10-CM | POA: Diagnosis not present

## 2020-02-05 DIAGNOSIS — J189 Pneumonia, unspecified organism: Secondary | ICD-10-CM | POA: Diagnosis not present

## 2020-02-05 DIAGNOSIS — E871 Hypo-osmolality and hyponatremia: Secondary | ICD-10-CM | POA: Diagnosis not present

## 2020-02-05 LAB — BASIC METABOLIC PANEL
Anion gap: 12 (ref 5–15)
BUN: 28 mg/dL — ABNORMAL HIGH (ref 8–23)
CO2: 28 mmol/L (ref 22–32)
Calcium: 8.6 mg/dL — ABNORMAL LOW (ref 8.9–10.3)
Chloride: 90 mmol/L — ABNORMAL LOW (ref 98–111)
Creatinine, Ser: 0.93 mg/dL (ref 0.61–1.24)
GFR, Estimated: >60 mL/min (ref >60)
Glucose, Bld: 96 mg/dL (ref 70–99)
Potassium: 3 mmol/L — ABNORMAL LOW (ref 3.5–5.1)
Sodium: 130 mmol/L — ABNORMAL LOW (ref 135–145)

## 2020-02-05 LAB — CBC
HCT: 37.3 % — ABNORMAL LOW (ref 39.0–52.0)
Hemoglobin: 13.2 g/dL (ref 13.0–17.0)
MCH: 35.3 pg — ABNORMAL HIGH (ref 26.0–34.0)
MCHC: 35.4 g/dL (ref 30.0–36.0)
MCV: 99.7 fL (ref 80.0–100.0)
Platelets: 222 10*3/uL (ref 150–400)
RBC: 3.74 MIL/uL — ABNORMAL LOW (ref 4.22–5.81)
RDW: 12.6 % (ref 11.5–15.5)
WBC: 8.4 10*3/uL (ref 4.0–10.5)
nRBC: 0 % (ref 0.0–0.2)

## 2020-02-05 MED ORDER — POTASSIUM CHLORIDE CRYS ER 20 MEQ PO TBCR
40.0000 meq | EXTENDED_RELEASE_TABLET | Freq: Two times a day (BID) | ORAL | Status: AC
  Administered 2020-02-05 (×2): 40 meq via ORAL
  Filled 2020-02-05 (×2): qty 2

## 2020-02-05 NOTE — Care Management Important Message (Signed)
Important Message  Patient Details  Name: Douglas Calhoun MRN: [id number] Date of Birth: Aug 30, 1919   Medicare Important Message Given:  Yes     Dannette Barbara 02/05/2020, 11:52 AM

## 2020-02-05 NOTE — Progress Notes (Signed)
PROGRESS NOTE    Douglas Calhoun  IHK:[id number] DOB: [birth year] DOA: 02/02/2020 PCP: Birdie Sons, MD   Assessment & Plan:   Principal Problem:   Generalized weakness Active Problems:   PVC (premature ventricular contraction)   Chronic systolic heart failure (HCC)   Permanent atrial fibrillation (HCC)   Status cardiac pacemaker   Hyponatremia, acute on chronic   Hypokalemia   CAP (community acquired pneumonia)   Falls   Elevated troponin   Hypoxia   Generalized weakness: w/ frequent falls. Likely secondary electrolyte abnormalities, hypotension, pneumonia, & physical deconditioning. PT/OT recs SNF   Acute on chronic hyponatremia: etiology unclear. Trending up today. Continue on NaCl tabs.  Hypokalemia: KCl repleted. Will continue to monitor    Elevated troponin: likely secondary to demand ischemia   CAP: continue on IV azithromycin, ceftriaxone & bronchodilators. Encourage incentive spirometry   Chronic systolic heart failure: echo showed EF of 25%. Euvolemic. All CHF meds except for torsemide have been discontinued by primary cardiologist 02/01/20. S/p pacemaker  Chronic a. fib: recently taken off of anticoagulation likely secondary to high fall risk    DVT prophylaxis: lovenox  Code Status: Full  Family Communication: Disposition Plan: likely d/c to SNF  Status is: Inpatient  Remains inpatient appropriate because:IV treatments appropriate due to intensity of illness or inability to take PO, CM is working on SNF placement   Dispo: The patient is from: Home              Anticipated d/c is to: SNF              Anticipated d/c date is: 1 or 2 days               Patient currently is not medically stable to d/c.     Consultants:      Procedures:    Antimicrobials: rocephin, azithromycin    Subjective: Pt c/o weakness still   Objective: Vitals:   02/04/20 1545 02/04/20 1944 02/05/20 0242 02/05/20 0506  BP: (!) 133/57 111/67  107/77  Pulse:  79 76  61  Resp: 17 19  17   Temp: 98.3 F (36.8 C) 97.9 F (36.6 C)  98.6 F (37 C)  TempSrc: Oral Oral  Oral  SpO2: 94% 90%  93%  Weight:   86.2 kg   Height:        Intake/Output Summary (Last 24 hours) at 02/05/2020 0750 Last data filed at 02/05/2020 0508 Gross per 24 hour  Intake 1651.8 ml  Output 1100 ml  Net 551.8 ml   Filed Weights   02/02/20 1943 02/04/20 0330 02/05/20 0242  Weight: 81.6 kg 86.5 kg 86.2 kg    Examination:  General exam: Appears calm & comfortable. Very hard of hearing   Respiratory system: decreased breath sounds b/l. No rales or rhonchi Cardiovascular system: Irregularly irregular. No rubs or gallops Gastrointestinal system: Abdomen is nondistended, soft and nontender.  Normal bowel sounds heard. Central nervous system: Alert and oriented. Moves all 4 extremities Psychiatry: Judgement and insight appear normal. Flat mood and affect     Data Reviewed: I have personally reviewed following labs and imaging studies  CBC: Recent Labs  Lab 02/02/20 1959 02/03/20 0542 02/04/20 0503 02/05/20 0419  WBC 13.6* 10.7* 12.3* 8.4  NEUTROABS 10.6*  --   --   --   HGB 15.8 14.1 14.1 13.2  HCT 42.9 38.1* 38.9* 37.3*  MCV 95.5 96.7 96.8 99.7  PLT 254 222 235 756   Basic Metabolic  Panel: Recent Labs  Lab 01/29/20 1349 02/02/20 1959 02/02/20 2310 02/03/20 0542 02/04/20 0503 02/05/20 0419  NA 122* 122*  --  125*  127* 129* 130*  K 2.4* 2.8*  --  3.1* 3.3* 3.0*  CL 70* 69*  --  78* 83* 90*  CO2 33* 33*  --  35* 31 28  GLUCOSE 149* 183*  --  102* 120* 96  BUN 79* 49*  --  42* 38* 28*  CREATININE 1.22 1.21  --  1.08 0.92 0.93  CALCIUM 9.5 9.5  --  8.7* 8.7* 8.6*  MG  --   --  2.4 2.3  --   --    GFR: Estimated Creatinine Clearance: 48.5 mL/min (by C-G formula based on SCr of 0.93 mg/dL). Liver Function Tests: Recent Labs  Lab 02/02/20 1959  AST 55*  ALT 28  ALKPHOS 91  BILITOT 1.9*  PROT 7.9  ALBUMIN 4.2   No results for input(s):  LIPASE, AMYLASE in the last 168 hours. No results for input(s): AMMONIA in the last 168 hours. Coagulation Profile: No results for input(s): INR, PROTIME in the last 168 hours. Cardiac Enzymes: No results for input(s): CKTOTAL, CKMB, CKMBINDEX, TROPONINI in the last 168 hours. BNP (last 3 results) No results for input(s): PROBNP in the last 8760 hours. HbA1C: No results for input(s): HGBA1C in the last 72 hours. CBG: No results for input(s): GLUCAP in the last 168 hours. Lipid Profile: No results for input(s): CHOL, HDL, LDLCALC, TRIG, CHOLHDL, LDLDIRECT in the last 72 hours. Thyroid Function Tests: No results for input(s): TSH, T4TOTAL, FREET4, T3FREE, THYROIDAB in the last 72 hours. Anemia Panel: No results for input(s): VITAMINB12, FOLATE, FERRITIN, TIBC, IRON, RETICCTPCT in the last 72 hours. Sepsis Labs: No results for input(s): PROCALCITON, LATICACIDVEN in the last 168 hours.  Recent Results (from the past 240 hour(s))  Respiratory Panel by RT PCR (Flu A&B, Covid) - Nasopharyngeal Swab     Status: None   Collection Time: 02/02/20  8:55 PM   Specimen: Nasopharyngeal Swab  Result Value Ref Range Status   SARS Coronavirus 2 by RT PCR NEGATIVE NEGATIVE Final    Comment: (NOTE) SARS-CoV-2 target nucleic acids are NOT DETECTED.  The SARS-CoV-2 RNA is generally detectable in upper respiratoy specimens during the acute phase of infection. The lowest concentration of SARS-CoV-2 viral copies this assay can detect is 131 copies/mL. A negative result does not preclude SARS-Cov-2 infection and should not be used as the sole basis for treatment or other patient management decisions. A negative result may occur with  improper specimen collection/handling, submission of specimen other than nasopharyngeal swab, presence of viral mutation(s) within the areas targeted by this assay, and inadequate number of viral copies (<131 copies/mL). A negative result must be combined with  clinical observations, patient history, and epidemiological information. The expected result is Negative.  Fact Sheet for Patients:  PinkCheek.be  Fact Sheet for Healthcare Providers:  GravelBags.it  This test is no t yet approved or cleared by the [place] FDA and  has been authorized for detection and/or diagnosis of SARS-CoV-2 by FDA under an Emergency Use Authorization (EUA). This EUA will remain  in effect (meaning this test can be used) for the duration of the COVID-19 declaration under Section 564(b)(1) of the Act, 21 U.S.C. section 360bbb-3(b)(1), unless the authorization is terminated or revoked sooner.     Influenza A by PCR NEGATIVE NEGATIVE Final   Influenza B by PCR NEGATIVE NEGATIVE Final  Comment: (NOTE) The Xpert Xpress SARS-CoV-2/FLU/RSV assay is intended as an aid in  the diagnosis of influenza from Nasopharyngeal swab specimens and  should not be used as a sole basis for treatment. Nasal washings and  aspirates are unacceptable for Xpert Xpress SARS-CoV-2/FLU/RSV  testing.  Fact Sheet for Patients: PinkCheek.be  Fact Sheet for Healthcare Providers: GravelBags.it  This test is not yet approved or cleared by the [place] FDA and  has been authorized for detection and/or diagnosis of SARS-CoV-2 by  FDA under an Emergency Use Authorization (EUA). This EUA will remain  in effect (meaning this test can be used) for the duration of the  Covid-19 declaration under Section 564(b)(1) of the Act, 21  U.S.C. section 360bbb-3(b)(1), unless the authorization is  terminated or revoked. Performed at [facility], [address], [place], [place] [address]          Radiology Studies: No results found.      Scheduled Meds: . acetaminophen  650 mg Oral TID  . enoxaparin (LOVENOX) injection  40 mg Subcutaneous Q24H  .  ferrous sulfate  325 mg Oral Q breakfast  . sodium chloride  1 g Oral BID WC  . tamsulosin  0.4 mg Oral QPC breakfast  . torsemide  20 mg Oral BID   Continuous Infusions: . sodium chloride 50 mL/hr at 02/04/20 1646  . azithromycin Stopped (02/04/20 1149)  . cefTRIAXone (ROCEPHIN)  IV Stopped (02/04/20 1046)     LOS: 3 days    Time spent: 34 mins     Wyvonnia Dusky, MD [facility] Pager [phone]  If 7PM-7AM, please contact night-coverage [email or web address] 02/05/2020, 7:50 AM

## 2020-02-05 NOTE — Progress Notes (Signed)
Mobility Specialist - Progress Note   02/05/20 1633  Mobility  Activity Transferred:  Chair to bed  Level of Assistance Minimal assist, patient does 75% or more  Assistive Device Front wheel walker  Distance Ambulated (ft) 3 ft  Mobility Response Tolerated well  Mobility performed by Mobility specialist  $Mobility charge 1 Mobility    Pre-mobility: 73 HR, 96% SpO2 Post-mobility: 86 HR, 94% SpO2   Pt was sitting in recliner upon arrival. Pt agreed to session. Pt requested getting back into bed. Pt was minA in STS and was able to ambulate laterally 2 ft before pivoting to bed with minA and VC for hand/foot placement. Overall, pt tolerated session well. Pt was left in bed with all needs in reach and alarm set. NT entered at the end of session.    Kathee Delton Mobility Specialist 02/05/20, 4:35 PM

## 2020-02-05 NOTE — Progress Notes (Signed)
Physical Therapy Treatment Patient Details Name: Douglas Calhoun MRN: [id number] DOB: [birth year] Today's Date: 02/05/2020    History of Present Illness 86 year old male with history of atrial fibrillation on Eliquis, discontinued 02/01/2020 by cardiologist due to recent falls, systolic heart failure with last EF 25% on 02/03/2019, sick sinus syndrome status post pacemaker on 9/20, presenting with intractable weakness and frequent falls. Generalized weakness and frequent falls likely due to combination of electrolyte abnormalities, hypotension related, physical deconditioning, pneumonia.     PT Comments    Pt was very eager to work with PT and ultimately was able to ambulate 2 separate bouts and was able to ambulate 10 ft and then later after exercises into the hallway for a total of 45 ft.  He had inconsistent cadence, speed and walker reliance but ultimately was very pleased with how much he was able to do.  Pt needed extra time and cuing for most transitions, especially with hand placement for to/from standing.  Pt's HR to ~100 with first bout of ambulation, 120s on longer one, O2 remains in the 90s on room air.    Follow Up Recommendations  SNF     Equipment Recommendations  None recommended by PT    Recommendations for Other Services       Precautions / Restrictions Precautions Precautions: Fall Restrictions Weight Bearing Restrictions: No    Mobility  Bed Mobility Overal bed mobility: Needs Assistance Bed Mobility: Supine to Sit;Sit to Supine     Supine to sit: Min guard Sit to supine: Min guard   General bed mobility comments: increased effort/time to perform, heavy use of rails to get up w/o phyiscal assist  Transfers Overall transfer level: Modified independent Equipment used: Rolling walker (2 wheeled) Transfers: Sit to/from Stand Sit to Stand: Min guard;From elevated surface         General transfer comment: cues for sequencing/safety, initially wanting to  pull up on walker, difficulty initially with RW requiring verbal/tactile cues.  Despite verbal cues needed tactile cuing to place hands back on rails to lower to sitting  Ambulation/Gait Ambulation/Gait assistance: Min assist Gait Distance (Feet): 45 Feet Assistive device: Rolling walker (2 wheeled)       General Gait Details: Pt able to ambulate ~10 ft on first effort, HR up to low 100s with fatigue.  After seated exercises we did additional bout of ambulation 45 ft.  Pt with variable speed and cadence but showed great effort and determination getting to push himself and was very pleased that he was able to ambulate as much as he did.  HR did get to the 120s with the effort, O2 remains in the 90s.   Stairs             Wheelchair Mobility    Modified Rankin (Stroke Patients Only)       Balance Overall balance assessment: Needs assistance Sitting-balance support: Feet supported;Single extremity supported;No upper extremity supported Sitting balance-Leahy Scale: Good     Standing balance support: Bilateral upper extremity supported;During functional activity;Single extremity supported Standing balance-Leahy Scale: Fair Standing balance comment: difficulty with dynamic balance; able to stand with RW                             Cognition Arousal/Alertness: Awake/alert Behavior During Therapy: WFL for tasks assessed/performed Overall Cognitive Status: Within Functional Limits for tasks assessed  Exercises General Exercises - Lower Extremity Ankle Circles/Pumps: AROM;10 reps Long Arc Quad: Strengthening;10 reps Heel Slides: Strengthening;10 reps Hip ABduction/ADduction: Strengthening;10 reps Hip Flexion/Marching: Strengthening;10 reps    General Comments        Pertinent Vitals/Pain Pain Assessment: No/denies pain Pain Location: Pt with minimal c/o chronic back pain    Home Living                       Prior Function            PT Goals (current goals can now be found in the care plan section) Progress towards PT goals: Progressing toward goals    Frequency    Min 2X/week      PT Plan Current plan remains appropriate    Co-evaluation              AM-PAC PT "6 Clicks" Mobility   Outcome Measure  Help needed turning from your back to your side while in a flat bed without using bedrails?: A Little Help needed moving from lying on your back to sitting on the side of a flat bed without using bedrails?: A Little Help needed moving to and from a bed to a chair (including a wheelchair)?: A Lot Help needed standing up from a chair using your arms (e.g., wheelchair or bedside chair)?: A Little Help needed to walk in hospital room?: A Little Help needed climbing 3-5 steps with a railing? : Total 6 Click Score: 15    End of Session Equipment Utilized During Treatment: Gait belt Activity Tolerance: Patient limited by fatigue Patient left: with call bell/phone within reach;with chair alarm set Nurse Communication: Mobility status PT Visit Diagnosis: Unsteadiness on feet (R26.81);Muscle weakness (generalized) (M62.81);Repeated falls (R29.6)     Time: 1410-1450 PT Time Calculation (min) (ACUTE ONLY): 40 min  Charges:  $Gait Training: 8-22 mins $Therapeutic Exercise: 8-22 mins $Therapeutic Activity: 8-22 mins                     Kreg Shropshire, DPT 02/05/2020, 3:17 PM

## 2020-02-06 ENCOUNTER — Encounter: Payer: Self-pay | Admitting: Internal Medicine

## 2020-02-06 DIAGNOSIS — I5022 Chronic systolic (congestive) heart failure: Secondary | ICD-10-CM | POA: Diagnosis not present

## 2020-02-06 DIAGNOSIS — R778 Other specified abnormalities of plasma proteins: Secondary | ICD-10-CM

## 2020-02-06 DIAGNOSIS — J189 Pneumonia, unspecified organism: Secondary | ICD-10-CM | POA: Diagnosis not present

## 2020-02-06 DIAGNOSIS — R531 Weakness: Secondary | ICD-10-CM | POA: Diagnosis not present

## 2020-02-06 LAB — CBC
HCT: 40.4 % (ref 39.0–52.0)
Hemoglobin: 14.2 g/dL (ref 13.0–17.0)
MCH: 35.8 pg — ABNORMAL HIGH (ref 26.0–34.0)
MCHC: 35.1 g/dL (ref 30.0–36.0)
MCV: 101.8 fL — ABNORMAL HIGH (ref 80.0–100.0)
Platelets: 211 10*3/uL (ref 150–400)
RBC: 3.97 MIL/uL — ABNORMAL LOW (ref 4.22–5.81)
RDW: 12.7 % (ref 11.5–15.5)
WBC: 10.2 10*3/uL (ref 4.0–10.5)
nRBC: 0 % (ref 0.0–0.2)

## 2020-02-06 LAB — BASIC METABOLIC PANEL
Anion gap: 9 (ref 5–15)
BUN: 26 mg/dL — ABNORMAL HIGH (ref 8–23)
CO2: 29 mmol/L (ref 22–32)
Calcium: 9 mg/dL (ref 8.9–10.3)
Chloride: 95 mmol/L — ABNORMAL LOW (ref 98–111)
Creatinine, Ser: 0.86 mg/dL (ref 0.61–1.24)
GFR, Estimated: >60 mL/min (ref >60)
Glucose, Bld: 108 mg/dL — ABNORMAL HIGH (ref 70–99)
Potassium: 3.6 mmol/L (ref 3.5–5.1)
Sodium: 133 mmol/L — ABNORMAL LOW (ref 135–145)

## 2020-02-06 MED ORDER — INFLUENZA VAC A&B SA ADJ QUAD 0.5 ML IM PRSY
0.5000 mL | PREFILLED_SYRINGE | INTRAMUSCULAR | Status: AC
  Administered 2020-02-07: 0.5 mL via INTRAMUSCULAR
  Filled 2020-02-06: qty 0.5

## 2020-02-06 MED ORDER — AMOXICILLIN-POT CLAVULANATE 875-125 MG PO TABS
1.0000 | ORAL_TABLET | Freq: Two times a day (BID) | ORAL | Status: AC
  Administered 2020-02-07 (×2): 1 via ORAL
  Filled 2020-02-06 (×2): qty 1

## 2020-02-06 NOTE — NC FL2 (Signed)
[place] LEVEL OF CARE SCREENING TOOL     IDENTIFICATION  Patient Name: Douglas Calhoun Birthdate: 01/21/1920 Sex: male Admission Date (Current Location): 02/02/2020  [place] and [place] Number:  Engineering geologist and Address:  [facility], [address] [place], [place] [address]      Provider Number: [id number]  Attending Physician Name and Address:  Jennye Boroughs, MD  Relative Name and Phone Number:       Current Level of Care: Hospital Recommended Level of Care: [place] Prior Approval Number:    Date Approved/Denied:   PASRR Number: [id number]  Discharge Plan: SNF    Current Diagnoses: Patient Active Problem List   Diagnosis Date Noted  . Status cardiac pacemaker 02/02/2020  . Hyponatremia, acute on chronic 02/02/2020  . Hypokalemia 02/02/2020  . Generalized weakness 02/02/2020  . CAP (community acquired pneumonia) 02/02/2020  . Falls 02/02/2020  . Elevated troponin 02/02/2020  . Hypoxia 02/02/2020  . Compression fracture of lumbar spine, non-traumatic ([place]) 03/27/2019  . Hyponatremia with excess extracellular fluid volume 03/02/2019  . SOBOE (shortness of breath on exertion) 01/19/2019  . Sick sinus syndrome ([place]) 01/11/2019  . Chronic systolic heart failure ([place]) 04/17/2018  . Permanent atrial fibrillation ([place]) 04/17/2018  . Acute exacerbation of CHF (congestive heart failure) ([place]) 03/29/2018  . Strain of rotator cuff capsule 11/07/2017  . Edema 10/09/2014  . Anemia 10/08/2014  . PVC (premature ventricular contraction) 10/08/2014  . Urinary incontinence 10/08/2014  . Varicose veins of legs 10/08/2014  . Venous insufficiency 10/08/2014  . Lipoma of skin, face 06/24/2008    Orientation RESPIRATION BLADDER Height & Weight     Self, Time, Situation, Place  Normal External catheter, Incontinent (placed 10/10) Weight: 187 lb 14.4 oz (85.2 kg) Height:  5\' 8"  (172.7 cm)  BEHAVIORAL  SYMPTOMS/MOOD NEUROLOGICAL BOWEL NUTRITION STATUS      Continent Diet (heart healthy, thin liquids)  AMBULATORY STATUS COMMUNICATION OF NEEDS Skin   Limited Assist Verbally Normal                       Personal Care Assistance Level of Assistance  Dressing, Feeding, Bathing Bathing Assistance: Limited assistance Feeding assistance: Independent Dressing Assistance: Limited assistance     Functional Limitations Info  Sight, Hearing, Speech Sight Info: Adequate Hearing Info: Adequate Speech Info: Adequate    SPECIAL CARE FACTORS FREQUENCY  PT (By licensed PT), OT (By licensed OT)     PT Frequency: 5x OT Frequency: 5x            Contractures Contractures Info: Not present    Additional Factors Info  Code Status, Allergies Code Status Info: Full Code Allergies Info: no known allergies           Current Medications (02/06/2020):  This is the current hospital active medication list Current Facility-Administered Medications  Medication Dose Route Frequency Provider Last Rate Last Admin  . acetaminophen (TYLENOL) tablet 650 mg  650 mg Oral TID Athena Masse, MD   650 mg at 02/06/20 0936  . cefTRIAXone (ROCEPHIN) 1 g in sodium chloride 0.9 % 100 mL IVPB  1 g Intravenous Q24H Wyvonnia Dusky, MD 200 mL/hr at 02/05/20 0906 1 g at 02/05/20 0906  . enoxaparin (LOVENOX) injection 40 mg  40 mg Subcutaneous Q24H Athena Masse, MD   40 mg at 02/05/20 2125  . ferrous sulfate tablet 325 mg  325 mg Oral Q breakfast Athena Masse,  MD   325 mg at 02/06/20 0936  . [START ON 02/07/2020] influenza vaccine adjuvanted (FLUAD) injection 0.5 mL  0.5 mL Intramuscular Tomorrow-1000 Jennye Boroughs, MD      . ipratropium-albuterol (DUONEB) 0.5-2.5 (3) MG/3ML nebulizer solution 3 mL  3 mL Nebulization Q6H PRN Wyvonnia Dusky, MD      . sodium chloride tablet 1 g  1 g Oral BID WC Wyvonnia Dusky, MD   1 g at 02/06/20 0936  . tamsulosin (FLOMAX) capsule 0.4 mg  0.4 mg Oral QPC  breakfast Athena Masse, MD   0.4 mg at 02/06/20 0936  . torsemide (DEMADEX) tablet 20 mg  20 mg Oral BID Athena Masse, MD   20 mg at 02/06/20 6629     Discharge Medications: Please see discharge summary for a list of discharge medications.  Relevant Imaging Results:  Relevant Lab Results:   Additional Information SSN: [id number]  Eileen Stanford, LCSW

## 2020-02-06 NOTE — Progress Notes (Signed)
Mobility Specialist - Progress Note   02/06/20 1300  Mobility  Activity Ambulated in room;Transferred to/from [facility]  Range of Motion/Exercises Right leg;Left leg (ankle pumps, slr, seated march, heel slides, hip iso)  Level of Assistance Minimal assist, patient does 75% or more  Assistive Device Front wheel walker  Distance Ambulated (ft) 30 ft  Mobility Response Tolerated well  Mobility performed by Mobility specialist  $Mobility charge 1 Mobility    Pre-mobility: 79 HR, 90% SpO2 Post-mobility: 100 HR, 95% SpO2    Pt was lying in bed upon arrival utilizing room air. Pt agreed to session. Pt was able to get EOB with minA and perform STS to RW with minA. Pt ambulated 30' just outside doorway before c/o feeling weak in LE. Pt was able to ambulate to recliner and further OOB activity was limited d/t weakness. However, pt requested to perform seated exercises for more mobility. Pt was able to perform: ankle pumps (15x/leg), heel slides, straight leg raises (12x/leg), hip isometrics (10x/leg), and seated march (20 secs) with SBA. Pt also requested assistance to get to [facility] as he felt he had a BM coming. Pt was unsuccessful with output this attempt, and stated that he "hasn't had a BM in over a week." Overall, pt tolerated session well. Pt was returned to bed with all needs in reach. Lunch tray was sat in front of pt and pt stated "can you put it back on the counter, I don't want it." Physician entered at the end of session. Bed alarm is set.   Kathee Delton Mobility Specialist 02/06/20, 1:36 PM

## 2020-02-06 NOTE — Progress Notes (Addendum)
Progress Note    Douglas Calhoun  VEH:[id number] DOB: 10/29/1919  DOA: 02/02/2020 PCP: Birdie Sons, MD      Brief Narrative:    Medical records reviewed and are as summarized below:  Douglas Calhoun is a 86 y.o. male       Assessment/Plan:   Principal Problem:   Generalized weakness Active Problems:   PVC (premature ventricular contraction)   Chronic systolic heart failure (HCC)   Permanent atrial fibrillation (HCC)   Status cardiac pacemaker   Hyponatremia, acute on chronic   Hypokalemia   CAP (community acquired pneumonia)   Falls   Elevated troponin   Hypoxia   Body mass index is 28.57 kg/m.    PLAN  Generalized weakness: w/ frequent falls: PT and OT recommend discharge to SNF.   Acute on chronic hyponatremia: Improving.  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  Continue salt tablets.  Hypokalemia:  Improved.  Continue potassium repletion and monitor levels.   Elevated troponin: Likely due to demand ischemia  Community-acquired pneumonia: Discontinue IV Rocephin and azithromycin and start Augmentin.    Chronic systolic heart failure: echo showed EF of 25%. Euvolemic. All CHF meds except for torsemide have been discontinued by primary cardiologist 02/01/20. S/p pacemaker  Chronic a. fib: recently taken off of anticoagulation likely secondary to high fall risk  Goals of care and CODE STATUS were discussed with the patient.  He said he wants to be DNR.  He said his wife has dementia and he has 2 grandsons but one of them is schizophrenic.  He said his friend, Ms. Patricia's Hissong, is his contact person.   Diet Order            Diet Heart Room service appropriate? Yes; Fluid consistency: Thin  Diet effective now                    Consultants:  None  Procedures:  None    Medications:   . acetaminophen  650 mg Oral TID  . [START ON 02/07/2020] amoxicillin-clavulanate  1 tablet Oral Q12H  . enoxaparin (LOVENOX) injection  40 mg  Subcutaneous Q24H  . ferrous sulfate  325 mg Oral Q breakfast  . [START ON 02/07/2020] influenza vaccine adjuvanted  0.5 mL Intramuscular Tomorrow-1000  . sodium chloride  1 g Oral BID WC  . tamsulosin  0.4 mg Oral QPC breakfast  . torsemide  20 mg Oral BID   Continuous Infusions:   Anti-infectives (From admission, onward)   Start     Dose/Rate Route Frequency Ordered Stop   02/07/20 1000  amoxicillin-clavulanate (AUGMENTIN) 875-125 MG per tablet 1 tablet        1 tablet Oral Every 12 hours 02/06/20 1714     02/03/20 1000  azithromycin (ZITHROMAX) 500 mg in sodium chloride 0.9 % 250 mL IVPB        500 mg 250 mL/hr over 60 Minutes Intravenous Every 24 hours 02/03/20 0754 02/06/20 1042   02/03/20 1000  cefTRIAXone (ROCEPHIN) 1 g in sodium chloride 0.9 % 100 mL IVPB        1 g 200 mL/hr over 30 Minutes Intravenous Every 24 hours 02/03/20 0754 02/06/20 1123   02/02/20 2115  cefTRIAXone (ROCEPHIN) 1 g in sodium chloride 0.9 % 100 mL IVPB        1 g 200 mL/hr over 30 Minutes Intravenous  Once 02/02/20 2102 02/02/20 2215   02/02/20 2115  azithromycin (ZITHROMAX) 500 mg in  sodium chloride 0.9 % 250 mL IVPB        500 mg 250 mL/hr over 60 Minutes Intravenous  Once 02/02/20 2102 02/02/20 2324             Family Communication/Anticipated D/C date and plan/Code Status   DVT prophylaxis: enoxaparin (LOVENOX) injection 40 mg Start: 02/02/20 2200     Code Status: DNR  Family Communication: None Disposition Plan:    Status is: Inpatient  Remains inpatient appropriate because:Unsafe d/c plan   Dispo: The patient is from: Home              Anticipated d/c is to: SNF              Anticipated d/c date is: 1 day              Patient currently is medically stable to d/c.           Subjective:   Interval events noted.  He is reluctant to go to SNF.  He had reported to the nurse that he just wanted to die in the hospital.  Objective:    Vitals:   02/06/20 0305  02/06/20 0730 02/06/20 1127 02/06/20 1639  BP: (!) 142/90 (!) 127/93 (!) 125/95 118/72  Pulse: 86 84 74 82  Resp:  18 17 17   Temp: 97.6 F (36.4 C) (!) 97.5 F (36.4 C) 98.2 F (36.8 C) 98 F (36.7 C)  TempSrc: Oral Oral Oral Oral  SpO2: (!) 89% 94% 96% 95%  Weight: 85.2 kg     Height:       No data found.   Intake/Output Summary (Last 24 hours) at 02/06/2020 1714 Last data filed at 02/06/2020 0313 Gross per 24 hour  Intake --  Output 302 ml  Net -302 ml   Filed Weights   02/04/20 0330 02/05/20 0242 02/06/20 0305  Weight: 86.5 kg 86.2 kg 85.2 kg    Exam:  GEN: NAD SKIN: Warm and dry EYES: EOMI ENT: MMM, bilateral hearing impairment requiring hearing aids CV: RRR PULM: CTA B ABD: soft, ND, NT, +BS CNS: AAO x 3, non focal EXT: No edema or tenderness   Data Reviewed:   I have personally reviewed following labs and imaging studies:  Labs: Labs show the following:   Basic Metabolic Panel: Recent Labs  Lab 02/02/20 1959 02/02/20 1959 02/02/20 2310 02/03/20 0542 02/03/20 0542 02/04/20 0503 02/04/20 0503 02/05/20 0419 02/06/20 0536  NA 122*  --   --  125*  127*  --  129*  --  130* 133*  K 2.8*   < >  --  3.1*   < > 3.3*   < > 3.0* 3.6  CL 69*  --   --  78*  --  83*  --  90* 95*  CO2 33*  --   --  35*  --  31  --  28 29  GLUCOSE 183*  --   --  102*  --  120*  --  96 108*  BUN 49*  --   --  42*  --  38*  --  28* 26*  CREATININE 1.21  --   --  1.08  --  0.92  --  0.93 0.86  CALCIUM 9.5  --   --  8.7*  --  8.7*  --  8.6* 9.0  MG  --   --  2.4 2.3  --   --   --   --   --    < > =  values in this interval not displayed.   GFR Estimated Creatinine Clearance: 52.2 mL/min (by C-G formula based on SCr of 0.86 mg/dL). Liver Function Tests: Recent Labs  Lab 02/02/20 1959  AST 55*  ALT 28  ALKPHOS 91  BILITOT 1.9*  PROT 7.9  ALBUMIN 4.2   No results for input(s): LIPASE, AMYLASE in the last 168 hours. No results for input(s): AMMONIA in the last 168  hours. Coagulation profile No results for input(s): INR, PROTIME in the last 168 hours.  CBC: Recent Labs  Lab 02/02/20 1959 02/03/20 0542 02/04/20 0503 02/05/20 0419 02/06/20 0536  WBC 13.6* 10.7* 12.3* 8.4 10.2  NEUTROABS 10.6*  --   --   --   --   HGB 15.8 14.1 14.1 13.2 14.2  HCT 42.9 38.1* 38.9* 37.3* 40.4  MCV 95.5 96.7 96.8 99.7 101.8*  PLT 254 222 235 222 211   Cardiac Enzymes: No results for input(s): CKTOTAL, CKMB, CKMBINDEX, TROPONINI in the last 168 hours. BNP (last 3 results) No results for input(s): PROBNP in the last 8760 hours. CBG: No results for input(s): GLUCAP in the last 168 hours. D-Dimer: No results for input(s): DDIMER in the last 72 hours. Hgb A1c: No results for input(s): HGBA1C in the last 72 hours. Lipid Profile: No results for input(s): CHOL, HDL, LDLCALC, TRIG, CHOLHDL, LDLDIRECT in the last 72 hours. Thyroid function studies: No results for input(s): TSH, T4TOTAL, T3FREE, THYROIDAB in the last 72 hours.  Invalid input(s): FREET3 Anemia work up: No results for input(s): VITAMINB12, FOLATE, FERRITIN, TIBC, IRON, RETICCTPCT in the last 72 hours. Sepsis Labs: Recent Labs  Lab 02/03/20 0542 02/04/20 0503 02/05/20 0419 02/06/20 0536  WBC 10.7* 12.3* 8.4 10.2    Microbiology Recent Results (from the past 240 hour(s))  Respiratory Panel by RT PCR (Flu A&B, Covid) - Nasopharyngeal Swab     Status: None   Collection Time: 02/02/20  8:55 PM   Specimen: Nasopharyngeal Swab  Result Value Ref Range Status   SARS Coronavirus 2 by RT PCR NEGATIVE NEGATIVE Final    Comment: (NOTE) SARS-CoV-2 target nucleic acids are NOT DETECTED.  The SARS-CoV-2 RNA is generally detectable in upper respiratoy specimens during the acute phase of infection. The lowest concentration of SARS-CoV-2 viral copies this assay can detect is 131 copies/mL. A negative result does not preclude SARS-Cov-2 infection and should not be used as the sole basis for treatment  or other patient management decisions. A negative result may occur with  improper specimen collection/handling, submission of specimen other than nasopharyngeal swab, presence of viral mutation(s) within the areas targeted by this assay, and inadequate number of viral copies (<131 copies/mL). A negative result must be combined with clinical observations, patient history, and epidemiological information. The expected result is Negative.  Fact Sheet for Patients:  PinkCheek.be  Fact Sheet for Healthcare Providers:  GravelBags.it  This test is no t yet approved or cleared by the [place] FDA and  has been authorized for detection and/or diagnosis of SARS-CoV-2 by FDA under an Emergency Use Authorization (EUA). This EUA will remain  in effect (meaning this test can be used) for the duration of the COVID-19 declaration under Section 564(b)(1) of the Act, 21 U.S.C. section 360bbb-3(b)(1), unless the authorization is terminated or revoked sooner.     Influenza A by PCR NEGATIVE NEGATIVE Final   Influenza B by PCR NEGATIVE NEGATIVE Final    Comment: (NOTE) The Xpert Xpress SARS-CoV-2/FLU/RSV assay is intended as an aid in  the  diagnosis of influenza from Nasopharyngeal swab specimens and  should not be used as a sole basis for treatment. Nasal washings and  aspirates are unacceptable for Xpert Xpress SARS-CoV-2/FLU/RSV  testing.  Fact Sheet for Patients: PinkCheek.be  Fact Sheet for Healthcare Providers: GravelBags.it  This test is not yet approved or cleared by the [place] FDA and  has been authorized for detection and/or diagnosis of SARS-CoV-2 by  FDA under an Emergency Use Authorization (EUA). This EUA will remain  in effect (meaning this test can be used) for the duration of the  Covid-19 declaration under Section 564(b)(1) of the Act, 21  U.S.C.  section 360bbb-3(b)(1), unless the authorization is  terminated or revoked. Performed at [facility], [address] [place], [place] [address]     Procedures and diagnostic studies:  DG Chest [facility] 1 View  Result Date: 02/05/2020 CLINICAL DATA:  Shortness of breath, weakness EXAM: PORTABLE CHEST 1 VIEW COMPARISON:  02/02/2020 FINDINGS: The heart size and mediastinal contours are stable. Atherosclerotic calcification of the aortic knob. Coarsened bibasilar interstitial markings with persistent patchy left basilar opacity, similar to prior. No large pleural fluid collection. No pneumothorax. Chronic healed right rib fractures. IMPRESSION: Persistent patchy left basilar opacity, similar to prior. Electronically Signed   By: Davina Poke D.O.   On: 02/05/2020 11:01               LOS: 4 days   Douglas Calhoun  [facility]   Pager on [email or web address]. If 7PM-7AM, please contact night-coverage at [email or web address]     02/06/2020, 5:14 PM

## 2020-02-07 DIAGNOSIS — R531 Weakness: Secondary | ICD-10-CM | POA: Diagnosis not present

## 2020-02-07 DIAGNOSIS — J189 Pneumonia, unspecified organism: Secondary | ICD-10-CM | POA: Diagnosis not present

## 2020-02-07 DIAGNOSIS — I5022 Chronic systolic (congestive) heart failure: Secondary | ICD-10-CM | POA: Diagnosis not present

## 2020-02-07 LAB — BASIC METABOLIC PANEL
Anion gap: 13 (ref 5–15)
BUN: 26 mg/dL — ABNORMAL HIGH (ref 8–23)
CO2: 24 mmol/L (ref 22–32)
Calcium: 9 mg/dL (ref 8.9–10.3)
Chloride: 95 mmol/L — ABNORMAL LOW (ref 98–111)
Creatinine, Ser: 1.05 mg/dL (ref 0.61–1.24)
GFR, Estimated: 59 mL/min — ABNORMAL LOW (ref >60)
Glucose, Bld: 202 mg/dL — ABNORMAL HIGH (ref 70–99)
Potassium: 3.6 mmol/L (ref 3.5–5.1)
Sodium: 132 mmol/L — ABNORMAL LOW (ref 135–145)

## 2020-02-07 MED ORDER — DOCUSATE SODIUM 100 MG PO CAPS
100.0000 mg | ORAL_CAPSULE | Freq: Two times a day (BID) | ORAL | Status: DC
  Administered 2020-02-07 – 2020-02-08 (×2): 100 mg via ORAL
  Filled 2020-02-07 (×3): qty 1

## 2020-02-07 MED ORDER — POLYETHYLENE GLYCOL 3350 17 G PO PACK
17.0000 g | PACK | Freq: Every day | ORAL | Status: DC
  Administered 2020-02-07 – 2020-02-08 (×2): 17 g via ORAL
  Filled 2020-02-07 (×2): qty 1

## 2020-02-07 NOTE — Progress Notes (Signed)
Occupational Therapy Treatment Patient Details Name: Douglas Calhoun MRN: [id number] DOB: 1919/06/17 Today's Date: 02/07/2020    History of present illness 86 year old male with history of atrial fibrillation on Eliquis, discontinued 02/01/2020 by cardiologist due to recent falls, systolic heart failure with last EF 25% on 02/03/2019, sick sinus syndrome status post pacemaker on 9/20, presenting with intractable weakness and frequent falls. Generalized weakness and frequent falls likely due to combination of electrolyte abnormalities, hypotension related, physical deconditioning, pneumonia.    OT comments  [name] participate in OT treatment session this date. Upon arrival to room, pt awake, A&O x 4, stating he feels weak but is not having pain, and expressing eagerness to get out of bed. Pt transitioned supine to sitting EOB somewhat slowly but w/o physical assist. Able to pull up/adjust socks with SUPV while seated EOB. Came into standing with Min A from therapist, using RW. Pt engaged in therex in standing -- marching in place, side stepping, stepping forwards and backwards -- with BUE support on RW and CGA for balance. Pt continued therex from recliner -- BUE passive resistance, forward punches, arm raises, leg lifts. Pt left in chair, alarm activated, call bell within reach, grandson and lab techs in room. Pt motivatrf to regain strength and return home. States that home services can be arranged through his IDL, and that family members can be available 24 hrs to provide support if needed. Pt is making good progress toward goals and will continue to benefit from skilled OT services to maximize return to PLOF. Will continue to follow POC. Recommend updating discharge recommendation to home with support services, including [facility].    Follow Up Recommendations  Home health OT    Equipment Recommendations       Recommendations for Other Services      Precautions / Restrictions  Precautions Precautions: Fall Restrictions Weight Bearing Restrictions: No       Mobility Bed Mobility Overal bed mobility: Needs Assistance Bed Mobility: Supine to Sit     Supine to sit: Min guard     General bed mobility comments: no physical assist required for supine<sit  Transfers Overall transfer level: Needs assistance Equipment used: Rolling walker (2 wheeled) Transfers: Stand Pivot Transfers Sit to Stand: Min assist Stand pivot transfers: Min assist       General transfer comment: required slight physical assistance to come to standing with RW from EOB    Balance Overall balance assessment: Needs assistance Sitting-balance support: Feet supported;Single extremity supported;No upper extremity supported Sitting balance-Leahy Scale: Good     Standing balance support: Bilateral upper extremity supported;During functional activity Standing balance-Leahy Scale: Fair Standing balance comment: able to stand and engage in therex with RW and BLE support, pt stated he felt "weak" but not unsteady             High level balance activites: Side stepping High Level Balance Comments: marching in place x 4 minutes, no LOB with RW and BUE support           ADL either performed or assessed with clinical judgement   ADL Overall ADL's : Needs assistance/impaired Eating/Feeding: Set up;Minimal assistance Eating/Feeding Details (indicate cue type and reason): Required assistance opening packets                 Lower Body Dressing: Min guard Lower Body Dressing Details (indicate cue type and reason): pt able to pull up/adjust socks without assistance, sitting EOB  Functional mobility during ADLs: Minimal assistance       Vision Baseline Vision/History: Wears glasses Wears Glasses: At all times Patient Visual Report: No change from baseline     Perception     Praxis      Cognition Arousal/Alertness: Awake/alert Behavior During  Therapy: WFL for tasks assessed/performed Overall Cognitive Status: Within Functional Limits for tasks assessed                                 General Comments: A&O x 4. A little word-finding difficulty        Exercises Other Exercises Other Exercises: Pt educated re: OT role, importance of mobility/OOB for functional strengthening, falls prevention, ECS, HEP; Therex   Shoulder Instructions       General Comments      Pertinent Vitals/ Pain       Pain Assessment: No/denies pain Pain Location: complaints of fatique but denies pain  Home Living                                          Prior Functioning/Environment              Frequency  Min 1X/week        Progress Toward Goals  OT Goals(current goals can now be found in the care plan section)  Progress towards OT goals: Progressing toward goals  Acute Rehab OT Goals Patient Stated Goal: to stay as independent at possible  OT Goal Formulation: With patient/family Time For Goal Achievement: 02/17/20 Potential to Achieve Goals: Good  Plan Frequency remains appropriate;Discharge plan needs to be updated    Co-evaluation                 AM-PAC OT "6 Clicks" Daily Activity     Outcome Measure   Help from another person eating meals?: None Help from another person taking care of personal grooming?: A Little Help from another person toileting, which includes using toliet, bedpan, or urinal?: A Lot Help from another person bathing (including washing, rinsing, drying)?: A Lot Help from another person to put on and taking off regular upper body clothing?: A Little Help from another person to put on and taking off regular lower body clothing?: A Little 6 Click Score: 17    End of Session Equipment Utilized During Treatment: Gait belt;Rolling walker  OT Visit Diagnosis: Other abnormalities of gait and mobility (R26.89);History of falling (Z91.81)   Activity Tolerance  Patient tolerated treatment well   Patient Left in chair;with call bell/phone within reach;with chair alarm set;with nursing/sitter in room;with family/visitor present   Nurse Communication          Time: 4163-8453 OT Time Calculation (min): 28 min  Charges: OT General Charges $OT Visit: 1 Visit OT Treatments $Self Care/Home Management : 8-22 mins $Therapeutic Exercise: 8-22 mins  Josiah Lobo, PhD, [place], OTR/L ascom [phone] 02/07/20, 11:49 AM

## 2020-02-07 NOTE — Progress Notes (Signed)
Physical Therapy Treatment Patient Details Name: Douglas Calhoun MRN: [id number] DOB: 05-08-1919 Today's Date: 02/07/2020    History of Present Illness 86 year old male with history of atrial fibrillation on Eliquis, discontinued 02/01/2020 by cardiologist due to recent falls, systolic heart failure with last EF 25% on 02/03/2019, sick sinus syndrome status post pacemaker on 9/20, presenting with intractable weakness and frequent falls. Generalized weakness and frequent falls likely due to combination of electrolyte abnormalities, hypotension related, physical deconditioning, pneumonia.     PT Comments    Pt did well with PT session, continues to report he feels weak but that he did eat 2 bananas today and is feeling like he is getting some energy back.  Overall he did well and showed good effort despite slow, shuffling gait.  Pt's resting HR on arrival was ~100, varried ~10 bpm +/- t/o activity, O2 remained in the 90s on room.  Pt struggled more with consistent quality of motion during exercises as compared to last session with this PT but overall eager to work with PT and pleased to have been able to do what he did today.     Follow Up Recommendations  SNF     Equipment Recommendations  None recommended by PT    Recommendations for Other Services       Precautions / Restrictions Precautions Precautions: Fall Restrictions Weight Bearing Restrictions: No    Mobility  Bed Mobility Overal bed mobility: Needs Assistance Bed Mobility: Supine to Sit     Supine to sit: Min guard     General bed mobility comments: in recliner on arrival, returned to recliner post ambulation  Transfers Overall transfer level: Needs assistance Equipment used: Rolling walker (2 wheeled) Transfers: Stand Pivot Transfers Sit to Stand: Min guard Stand pivot transfers: Min guard       General transfer comment: Pt able to rise to standing w/o direct assist, lacked control with descent despite cues to  use UEs on arm rests (he did not)  Ambulation/Gait Ambulation/Gait assistance: Min assist Gait Distance (Feet): 45 Feet Assistive device: Rolling walker (2 wheeled)       General Gait Details: Pt with slow shuffling gait and reliant on the walker but no LOBs. He did have some shortness of breath/fatigue but HR remained in the 90-110 range t/o and O2 (on room air) in the 90s.  Cuing for walker use, cadence, step length and breathing.   Stairs             Wheelchair Mobility    Modified Rankin (Stroke Patients Only)       Balance Overall balance assessment: Needs assistance Sitting-balance support: Feet supported;Single extremity supported;No upper extremity supported Sitting balance-Leahy Scale: Good     Standing balance support: Bilateral upper extremity supported;During functional activity Standing balance-Leahy Scale: Fair Standing balance comment: No LOBs but reliant on the walker             High level balance activites: Side stepping High Level Balance Comments: marching in place x 4 minutes, no LOB with RW and BUE support            Cognition Arousal/Alertness: Awake/alert Behavior During Therapy: WFL for tasks assessed/performed Overall Cognitive Status: Within Functional Limits for tasks assessed                                 General Comments: A&O x 4. A little word-finding difficulty  Exercises General Exercises - Lower Extremity Ankle Circles/Pumps: AROM;10 reps Long Arc Quad: Strengthening;10 reps (difficulty consistently organizing full ROM effort) Heel Slides: 10 reps;AROM (with resisted leg extensions) Hip ABduction/ADduction: Strengthening;10 reps Hip Flexion/Marching: Strengthening;10 reps Other Exercises Other Exercises: Pt educated re: OT role, importance of mobility/OOB for functional strengthening, falls prevention, ECS, HEP; Therex    General Comments        Pertinent Vitals/Pain Pain Assessment:  No/denies pain Pain Location: complaints of fatique but denies pain    Home Living                      Prior Function            PT Goals (current goals can now be found in the care plan section) Acute Rehab PT Goals Patient Stated Goal: to stay as independent at possible  Progress towards PT goals: Progressing toward goals    Frequency    Min 2X/week      PT Plan Current plan remains appropriate    Co-evaluation              AM-PAC PT "6 Clicks" Mobility   Outcome Measure  Help needed turning from your back to your side while in a flat bed without using bedrails?: A Little Help needed moving from lying on your back to sitting on the side of a flat bed without using bedrails?: A Little Help needed moving to and from a bed to a chair (including a wheelchair)?: A Lot Help needed standing up from a chair using your arms (e.g., wheelchair or bedside chair)?: A Little Help needed to walk in hospital room?: A Little Help needed climbing 3-5 steps with a railing? : Total 6 Click Score: 15    End of Session Equipment Utilized During Treatment: Gait belt Activity Tolerance: Patient limited by fatigue Patient left: with call bell/phone within reach;with chair alarm set Nurse Communication: Mobility status PT Visit Diagnosis: Unsteadiness on feet (R26.81);Muscle weakness (generalized) (M62.81);Repeated falls (R29.6)     Time: 5456-2563 PT Time Calculation (min) (ACUTE ONLY): 29 min  Charges:  $Gait Training: 8-22 mins $Therapeutic Exercise: 8-22 mins                     Kreg Shropshire, DPT 02/07/2020, 1:33 PM

## 2020-02-07 NOTE — Progress Notes (Addendum)
Progress Note    Douglas Calhoun  HAL:[id number] DOB: 10-08-1919  DOA: 02/02/2020 PCP: Birdie Sons, MD      Brief Narrative:    Medical records reviewed and are as summarized below:  Douglas Calhoun is a 86 y.o. male       Assessment/Plan:   Principal Problem:   Generalized weakness Active Problems:   PVC (premature ventricular contraction)   Chronic systolic heart failure (HCC)   Permanent atrial fibrillation (HCC)   Status cardiac pacemaker   Hyponatremia, acute on chronic   Hypokalemia   CAP (community acquired pneumonia)   Falls   Elevated troponin   Hypoxia   Body mass index is 28.83 kg/m.    PLAN  Generalized weakness: w/ frequent falls: PT and OT recommend discharge to SNF.  Acute on chronic hyponatremia: Improved.  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  Continue salt tablets.    Hypokalemia:  Improved.  Constipation: Ordered MiraLAX and Colace  Elevated troponin: Likely due to demand ischemia  Community-acquired pneumonia: Continue Augmentin for 1 more day.  Chronic systolic heart failure: echo showed EF of 25%. Euvolemic. All CHF meds except for torsemide have been discontinued by primary cardiologist 02/01/20. S/p pacemaker  Chronic a. fib: recently taken off of anticoagulation likely secondary to high fall risk    Diet Order            Diet Heart Room service appropriate? Yes; Fluid consistency: Thin  Diet effective now                    Consultants:  None  Procedures:  None    Medications:   . acetaminophen  650 mg Oral TID  . amoxicillin-clavulanate  1 tablet Oral Q12H  . docusate sodium  100 mg Oral BID  . enoxaparin (LOVENOX) injection  40 mg Subcutaneous Q24H  . ferrous sulfate  325 mg Oral Q breakfast  . polyethylene glycol  17 g Oral Daily  . sodium chloride  1 g Oral BID WC  . tamsulosin  0.4 mg Oral QPC breakfast  . torsemide  20 mg Oral BID   Continuous Infusions:   Anti-infectives (From  admission, onward)   Start     Dose/Rate Route Frequency Ordered Stop   02/07/20 1000  amoxicillin-clavulanate (AUGMENTIN) 875-125 MG per tablet 1 tablet        1 tablet Oral Every 12 hours 02/06/20 1714 02/07/20 2359   02/03/20 1000  azithromycin (ZITHROMAX) 500 mg in sodium chloride 0.9 % 250 mL IVPB        500 mg 250 mL/hr over 60 Minutes Intravenous Every 24 hours 02/03/20 0754 02/06/20 1042   02/03/20 1000  cefTRIAXone (ROCEPHIN) 1 g in sodium chloride 0.9 % 100 mL IVPB        1 g 200 mL/hr over 30 Minutes Intravenous Every 24 hours 02/03/20 0754 02/06/20 1123   02/02/20 2115  cefTRIAXone (ROCEPHIN) 1 g in sodium chloride 0.9 % 100 mL IVPB        1 g 200 mL/hr over 30 Minutes Intravenous  Once 02/02/20 2102 02/02/20 2215   02/02/20 2115  azithromycin (ZITHROMAX) 500 mg in sodium chloride 0.9 % 250 mL IVPB        500 mg 250 mL/hr over 60 Minutes Intravenous  Once 02/02/20 2102 02/02/20 2324             Family Communication/Anticipated D/C date and plan/Code Status   DVT prophylaxis: enoxaparin (  LOVENOX) injection 40 mg Start: 02/02/20 2200     Code Status: DNR  Family Communication: None Disposition Plan:    Status is: Inpatient  Remains inpatient appropriate because:Unsafe d/c plan   Dispo: The patient is from: Home              Anticipated d/c is to: SNF              Anticipated d/c date is: 1 day              Patient currently is medically stable to d/c.           Subjective:   Interval events noted.  C/o constipation and he requested a stool softener.  No cough, chest pain or shortness of breath.  Objective:    Vitals:   02/07/20 0323 02/07/20 0400 02/07/20 0846 02/07/20 1353  BP: 114/72  (!) 125/95 (!) 112/58  Pulse: 81  74 (!) 49  Resp: 18  19 17   Temp: 97.8 F (36.6 C)  97.8 F (36.6 C) 97.6 F (36.4 C)  TempSrc: Oral  Oral   SpO2: 97%  98% 95%  Weight:  86 kg    Height:       No data found.   Intake/Output Summary (Last 24  hours) at 02/07/2020 1623 Last data filed at 02/07/2020 1017 Gross per 24 hour  Intake 360 ml  Output 1950 ml  Net -1590 ml   Filed Weights   02/05/20 0242 02/06/20 0305 02/07/20 0400  Weight: 86.2 kg 85.2 kg 86 kg    Exam:  GEN: NAD SKIN: Warm and dry EYES: No pallor or icterus ENT: MMM, bilateral hearing impairment CV: RRR PULM: CTA B ABD: soft, ND, NT, +BS CNS: AAO x 3, non focal EXT: No edema or tenderness    Data Reviewed:   I have personally reviewed following labs and imaging studies:  Labs: Labs show the following:   Basic Metabolic Panel: Recent Labs  Lab 02/02/20 1959 02/02/20 2310 02/03/20 0542 02/03/20 0542 02/04/20 0503 02/04/20 0503 02/05/20 0419 02/05/20 0419 02/06/20 0536 02/07/20 1000  NA   < >  --  125*  127*  --  129*  --  130*  --  133* 132*  K   < >  --  3.1*   < > 3.3*   < > 3.0*   < > 3.6 3.6  CL   < >  --  78*  --  83*  --  90*  --  95* 95*  CO2   < >  --  35*  --  31  --  28  --  29 24  GLUCOSE   < >  --  102*  --  120*  --  96  --  108* 202*  BUN   < >  --  42*  --  38*  --  28*  --  26* 26*  CREATININE   < >  --  1.08  --  0.92  --  0.93  --  0.86 1.05  CALCIUM   < >  --  8.7*  --  8.7*  --  8.6*  --  9.0 9.0  MG  --  2.4 2.3  --   --   --   --   --   --   --    < > = values in this interval not displayed.   GFR Estimated Creatinine Clearance: 42.9 mL/min (by C-G formula based  on SCr of 1.05 mg/dL). Liver Function Tests: Recent Labs  Lab 02/02/20 1959  AST 55*  ALT 28  ALKPHOS 91  BILITOT 1.9*  PROT 7.9  ALBUMIN 4.2   No results for input(s): LIPASE, AMYLASE in the last 168 hours. No results for input(s): AMMONIA in the last 168 hours. Coagulation profile No results for input(s): INR, PROTIME in the last 168 hours.  CBC: Recent Labs  Lab 02/02/20 1959 02/03/20 0542 02/04/20 0503 02/05/20 0419 02/06/20 0536  WBC 13.6* 10.7* 12.3* 8.4 10.2  NEUTROABS 10.6*  --   --   --   --   HGB 15.8 14.1 14.1 13.2 14.2    HCT 42.9 38.1* 38.9* 37.3* 40.4  MCV 95.5 96.7 96.8 99.7 101.8*  PLT 254 222 235 222 211   Cardiac Enzymes: No results for input(s): CKTOTAL, CKMB, CKMBINDEX, TROPONINI in the last 168 hours. BNP (last 3 results) No results for input(s): PROBNP in the last 8760 hours. CBG: No results for input(s): GLUCAP in the last 168 hours. D-Dimer: No results for input(s): DDIMER in the last 72 hours. Hgb A1c: No results for input(s): HGBA1C in the last 72 hours. Lipid Profile: No results for input(s): CHOL, HDL, LDLCALC, TRIG, CHOLHDL, LDLDIRECT in the last 72 hours. Thyroid function studies: No results for input(s): TSH, T4TOTAL, T3FREE, THYROIDAB in the last 72 hours.  Invalid input(s): FREET3 Anemia work up: No results for input(s): VITAMINB12, FOLATE, FERRITIN, TIBC, IRON, RETICCTPCT in the last 72 hours. Sepsis Labs: Recent Labs  Lab 02/03/20 0542 02/04/20 0503 02/05/20 0419 02/06/20 0536  WBC 10.7* 12.3* 8.4 10.2    Microbiology Recent Results (from the past 240 hour(s))  Respiratory Panel by RT PCR (Flu A&B, Covid) - Nasopharyngeal Swab     Status: None   Collection Time: 02/02/20  8:55 PM   Specimen: Nasopharyngeal Swab  Result Value Ref Range Status   SARS Coronavirus 2 by RT PCR NEGATIVE NEGATIVE Final    Comment: (NOTE) SARS-CoV-2 target nucleic acids are NOT DETECTED.  The SARS-CoV-2 RNA is generally detectable in upper respiratoy specimens during the acute phase of infection. The lowest concentration of SARS-CoV-2 viral copies this assay can detect is 131 copies/mL. A negative result does not preclude SARS-Cov-2 infection and should not be used as the sole basis for treatment or other patient management decisions. A negative result may occur with  improper specimen collection/handling, submission of specimen other than nasopharyngeal swab, presence of viral mutation(s) within the areas targeted by this assay, and inadequate number of viral copies (<131  copies/mL). A negative result must be combined with clinical observations, patient history, and epidemiological information. The expected result is Negative.  Fact Sheet for Patients:  PinkCheek.be  Fact Sheet for Healthcare Providers:  GravelBags.it  This test is no t yet approved or cleared by the [place] FDA and  has been authorized for detection and/or diagnosis of SARS-CoV-2 by FDA under an Emergency Use Authorization (EUA). This EUA will remain  in effect (meaning this test can be used) for the duration of the COVID-19 declaration under Section 564(b)(1) of the Act, 21 U.S.C. section 360bbb-3(b)(1), unless the authorization is terminated or revoked sooner.     Influenza A by PCR NEGATIVE NEGATIVE Final   Influenza B by PCR NEGATIVE NEGATIVE Final    Comment: (NOTE) The Xpert Xpress SARS-CoV-2/FLU/RSV assay is intended as an aid in  the diagnosis of influenza from Nasopharyngeal swab specimens and  should not be used as a sole basis  for treatment. Nasal washings and  aspirates are unacceptable for Xpert Xpress SARS-CoV-2/FLU/RSV  testing.  Fact Sheet for Patients: PinkCheek.be  Fact Sheet for Healthcare Providers: GravelBags.it  This test is not yet approved or cleared by the [place] FDA and  has been authorized for detection and/or diagnosis of SARS-CoV-2 by  FDA under an Emergency Use Authorization (EUA). This EUA will remain  in effect (meaning this test can be used) for the duration of the  Covid-19 declaration under Section 564(b)(1) of the Act, 21  U.S.C. section 360bbb-3(b)(1), unless the authorization is  terminated or revoked. Performed at [facility], [address] [place], [place] [address]     Procedures and diagnostic studies:  No results found.             LOS: 5 days   Maykayla Highley  [facility]   Pager on [email or web address]. If 7PM-7AM, please contact night-coverage at [email or web address]     02/07/2020, 4:23 PM

## 2020-02-07 NOTE — TOC Initial Note (Signed)
Transition of Care [facility]) - Initial/Assessment Note    Patient Details  Name: Douglas Calhoun MRN: [id number] Date of Birth: 10-05-1919  Transition [facility]) CM/SW Contact:    Eileen Stanford, LCSW Phone Number: 02/07/2020, 12:19 PM  Clinical Narrative:       Pt is alert and oriented. Pt is agreeable to SNF. Pt wants to go to [facility]. CSW will reach out to SNF to determine if they can take pt.             Expected Discharge Plan: Skilled Nursing Facility Barriers to Discharge: Continued Medical Work up, Ship broker   Patient Goals and CMS Choice Patient states their goals for this hospitalization and ongoing recovery are:: [facility]   Choice offered to / list presented to : Patient  Expected Discharge Plan and Services Expected Discharge Plan: [place] In-house Referral: NA   Post Acute Care Choice: [place] Living arrangements for the past 2 months: Single Family Home                                      Prior Living Arrangements/Services Living arrangements for the past 2 months: Single Family Home Lives with:: Self Patient language and need for interpreter reviewed:: Yes Do you feel safe going back to the place where you live?: Yes      Need for Family Participation in Patient Care: Yes (Comment) Care giver support system in place?: Yes (comment)   Criminal Activity/Legal Involvement Pertinent to Current Situation/Hospitalization: No - Comment as needed  Activities of Daily Living      Permission Sought/Granted Permission sought to share information with : Family Supports    Share Information with NAME: patricia  Permission granted to share info w AGENCY: [facility]  Permission granted to share info w Relationship: friend     Emotional Assessment Appearance:: Appears stated age Attitude/Demeanor/Rapport: Engaged Affect (typically observed): Accepting, Appropriate Orientation: : Oriented to Situation,  Oriented to  Time, Oriented to Place, Oriented to Self Alcohol / Substance Use: Not Applicable Psych Involvement: No (comment)  Admission diagnosis:  Hypokalemia [E87.6] Hyponatremia [E87.1] Weakness [R53.1] Pneumonia due to infectious organism, unspecified laterality, unspecified part of lung [J18.9] Patient Active Problem List   Diagnosis Date Noted  . Status cardiac pacemaker 02/02/2020  . Hyponatremia, acute on chronic 02/02/2020  . Hypokalemia 02/02/2020  . Generalized weakness 02/02/2020  . CAP (community acquired pneumonia) 02/02/2020  . Falls 02/02/2020  . Elevated troponin 02/02/2020  . Hypoxia 02/02/2020  . Compression fracture of lumbar spine, non-traumatic ([place]) 03/27/2019  . Hyponatremia with excess extracellular fluid volume 03/02/2019  . SOBOE (shortness of breath on exertion) 01/19/2019  . Sick sinus syndrome ([place]) 01/11/2019  . Chronic systolic heart failure ([place]) 04/17/2018  . Permanent atrial fibrillation ([place]) 04/17/2018  . Acute exacerbation of CHF (congestive heart failure) ([place]) 03/29/2018  . Strain of rotator cuff capsule 11/07/2017  . Edema 10/09/2014  . Anemia 10/08/2014  . PVC (premature ventricular contraction) 10/08/2014  . Urinary incontinence 10/08/2014  . Varicose veins of legs 10/08/2014  . Venous insufficiency 10/08/2014  . Lipoma of skin, face 06/24/2008   PCP:  Birdie Sons, MD Pharmacy:   [place], [place] - [address] [address] Phone: [phone] Fax: [phone]     Social Determinants of Health (SDOH) Interventions  Readmission Risk Interventions No flowsheet data found.

## 2020-02-08 DIAGNOSIS — I5030 Unspecified diastolic (congestive) heart failure: Secondary | ICD-10-CM | POA: Diagnosis not present

## 2020-02-08 DIAGNOSIS — I4891 Unspecified atrial fibrillation: Secondary | ICD-10-CM | POA: Diagnosis not present

## 2020-02-08 DIAGNOSIS — M6281 Muscle weakness (generalized): Secondary | ICD-10-CM | POA: Diagnosis not present

## 2020-02-08 DIAGNOSIS — E876 Hypokalemia: Secondary | ICD-10-CM

## 2020-02-08 DIAGNOSIS — W19XXXA Unspecified fall, initial encounter: Secondary | ICD-10-CM | POA: Diagnosis not present

## 2020-02-08 DIAGNOSIS — R1312 Dysphagia, oropharyngeal phase: Secondary | ICD-10-CM | POA: Diagnosis not present

## 2020-02-08 DIAGNOSIS — E871 Hypo-osmolality and hyponatremia: Secondary | ICD-10-CM | POA: Diagnosis not present

## 2020-02-08 DIAGNOSIS — I501 Left ventricular failure: Secondary | ICD-10-CM | POA: Diagnosis not present

## 2020-02-08 DIAGNOSIS — Z7401 Bed confinement status: Secondary | ICD-10-CM | POA: Diagnosis not present

## 2020-02-08 DIAGNOSIS — Y929 Unspecified place or not applicable: Secondary | ICD-10-CM | POA: Diagnosis not present

## 2020-02-08 DIAGNOSIS — Z23 Encounter for immunization: Secondary | ICD-10-CM | POA: Diagnosis not present

## 2020-02-08 DIAGNOSIS — R2681 Unsteadiness on feet: Secondary | ICD-10-CM | POA: Diagnosis not present

## 2020-02-08 DIAGNOSIS — E569 Vitamin deficiency, unspecified: Secondary | ICD-10-CM | POA: Diagnosis not present

## 2020-02-08 DIAGNOSIS — I509 Heart failure, unspecified: Secondary | ICD-10-CM | POA: Diagnosis not present

## 2020-02-08 DIAGNOSIS — R5381 Other malaise: Secondary | ICD-10-CM | POA: Diagnosis not present

## 2020-02-08 DIAGNOSIS — M2550 Pain in unspecified joint: Secondary | ICD-10-CM | POA: Diagnosis not present

## 2020-02-08 DIAGNOSIS — R531 Weakness: Secondary | ICD-10-CM | POA: Diagnosis not present

## 2020-02-08 DIAGNOSIS — J189 Pneumonia, unspecified organism: Secondary | ICD-10-CM | POA: Diagnosis not present

## 2020-02-08 DIAGNOSIS — I110 Hypertensive heart disease with heart failure: Secondary | ICD-10-CM | POA: Diagnosis not present

## 2020-02-08 DIAGNOSIS — Z950 Presence of cardiac pacemaker: Secondary | ICD-10-CM | POA: Diagnosis not present

## 2020-02-08 DIAGNOSIS — E119 Type 2 diabetes mellitus without complications: Secondary | ICD-10-CM | POA: Diagnosis not present

## 2020-02-08 DIAGNOSIS — F32A Depression, unspecified: Secondary | ICD-10-CM | POA: Diagnosis not present

## 2020-02-08 DIAGNOSIS — R296 Repeated falls: Secondary | ICD-10-CM | POA: Diagnosis not present

## 2020-02-08 DIAGNOSIS — I5022 Chronic systolic (congestive) heart failure: Secondary | ICD-10-CM | POA: Diagnosis not present

## 2020-02-08 DIAGNOSIS — R0902 Hypoxemia: Secondary | ICD-10-CM

## 2020-02-08 DIAGNOSIS — N390 Urinary tract infection, site not specified: Secondary | ICD-10-CM | POA: Diagnosis not present

## 2020-02-08 DIAGNOSIS — R778 Other specified abnormalities of plasma proteins: Secondary | ICD-10-CM | POA: Diagnosis not present

## 2020-02-08 DIAGNOSIS — Z743 Need for continuous supervision: Secondary | ICD-10-CM | POA: Diagnosis not present

## 2020-02-08 DIAGNOSIS — E611 Iron deficiency: Secondary | ICD-10-CM | POA: Diagnosis not present

## 2020-02-08 DIAGNOSIS — D649 Anemia, unspecified: Secondary | ICD-10-CM | POA: Diagnosis not present

## 2020-02-08 LAB — SARS CORONAVIRUS 2 BY RT PCR (HOSPITAL ORDER, PERFORMED IN ~~LOC~~ HOSPITAL LAB): SARS Coronavirus 2: NEGATIVE

## 2020-02-08 MED ORDER — POTASSIUM CHLORIDE ER 10 MEQ PO TBCR: 20.0000 meq | EXTENDED_RELEASE_TABLET | ORAL | Status: DC

## 2020-02-08 MED ORDER — SODIUM CHLORIDE 1 G PO TABS: 1.0000 g | ORAL_TABLET | Freq: Two times a day (BID) | ORAL | 0 refills | Status: AC

## 2020-02-08 MED ORDER — ACETAMINOPHEN 325 MG PO TABS: 650.0000 mg | ORAL_TABLET | Freq: Four times a day (QID) | ORAL | Status: AC | PRN

## 2020-02-08 NOTE — Progress Notes (Signed)
Report Called to [facility] [place] RN to received report Time allowed for questions and concerns Verbalizes an understanding.  Awaiting medical transport for transport

## 2020-02-08 NOTE — Discharge Summary (Signed)
Physician Discharge Summary  Douglas Calhoun ZJQ:[id number] DOB: November 02, 1919 DOA: 02/02/2020  PCP: Birdie Sons, MD  Admit date: 02/02/2020 Discharge date: 02/08/2020  Discharge disposition: SNF   Recommendations for Outpatient Follow-Up:   Follow-up with physician at the nursing home within 3 days of discharge Monitor BMP at the nursing home.   Discharge Diagnosis:   Principal Problem:   Generalized weakness Active Problems:   PVC (premature ventricular contraction)   Chronic systolic heart failure (HCC)   Permanent atrial fibrillation (HCC)   Status cardiac pacemaker   Hyponatremia, acute on chronic   Hypokalemia   CAP (community acquired pneumonia)   Falls   Elevated troponin   Hypoxia    Discharge Condition: Stable.  Diet recommendation:  Diet Order            Diet - low sodium heart healthy           Diet Heart Room service appropriate? Yes; Fluid consistency: Thin  Diet effective now                   Code Status: DNR     Hospital Course:   Douglas Calhoun is a 86 y.o. male with medical history significant for atrial fibrillation (previously on Eliquis but discontinued on 02/01/2020 by cardiologist due to recent falls), chronic systolic heart failure with last EF 25% on 02/03/2019, sick sinus syndrome status post pacemaker in September 2020.  Metolazone had been recently prescribed for fluid retention but this was discontinued because of problems with hypokalemia.  He was seen by his cardiologist on 02/01/2019 and CHF medicines were discontinued because of low blood pressure.  He was brought to the hospital because of generalized weakness, recent falls and inability to get out of bed.  Reportedly, his oxygen saturation was 87% on room air.  He was admitted to the hospital for community-acquired pneumonia, hypokalemia and acute on chronic hyponatremia.  He was treated with empiric IV antibiotics (ceftriaxone and azithromycin).  Potassium was repleted  successfully and sodium level has improved with salt tablets.  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  He required 2 L/min oxygen via nasal cannula for acute hypoxemic respiratory failure.    He has been successfully weaned off of oxygen.  He was evaluated by PT and OT who recommended further rehabilitation at a skilled nursing facility.  His condition has improved and he is deemed stable for discharge to SNF today.    Discharge Exam:    Vitals:   02/07/20 1941 02/08/20 0138 02/08/20 0313 02/08/20 0818  BP: 115/73  108/68 102/77  Pulse: (!) 50  94 69  Resp: 18  18 16   Temp: 97.9 F (36.6 C)  97.8 F (36.6 C) 98 F (36.7 C)  TempSrc:   Oral Oral  SpO2: 93%  93% 91%  Weight:  84.2 kg    Height:         GEN: NAD SKIN: Warm and dry EYES: No pallor or icterus ENT: MMM CV: RRR PULM: CTA B ABD: soft, ND, NT, +BS CNS: AAO x 3, non focal EXT: No edema or tenderness   The results of significant diagnostics from this hospitalization (including imaging, microbiology, ancillary and laboratory) are listed below for reference.     Procedures and Diagnostic Studies:   DG Chest Portable 1 View  Result Date: 02/02/2020 CLINICAL DATA:  Hypoxia EXAM: PORTABLE CHEST 1 VIEW COMPARISON:  12/13/2019 FINDINGS: There is focal pulmonary infiltrate noted peripherally within the left  lung base, likely infectious or inflammatory in the acute setting. The lungs are otherwise clear. No pneumothorax or pleural effusion. Cardiac size is at the upper limits of normal, stable since prior examination. Pulmonary vascularity is normal. No acute bone abnormality. IMPRESSION: Left basilar focal pulmonary infiltrate, likely infectious or inflammatory. Electronically Signed   By: Fidela Salisbury MD   On: 02/02/2020 20:26     Labs:   Basic Metabolic Panel: Recent Labs  Lab 02/02/20 1959 02/02/20 2310 02/03/20 0542 02/03/20 0542 02/04/20 0503 02/04/20 0503 02/05/20 5053 02/05/20 0419 02/06/20 0536  02/07/20 1000  NA   < >  --  125*  127*  --  129*  --  130*  --  133* 132*  K   < >  --  3.1*   < > 3.3*   < > 3.0*   < > 3.6 3.6  CL   < >  --  78*  --  83*  --  90*  --  95* 95*  CO2   < >  --  35*  --  31  --  28  --  29 24  GLUCOSE   < >  --  102*  --  120*  --  96  --  108* 202*  BUN   < >  --  42*  --  38*  --  28*  --  26* 26*  CREATININE   < >  --  1.08  --  0.92  --  0.93  --  0.86 1.05  CALCIUM   < >  --  8.7*  --  8.7*  --  8.6*  --  9.0 9.0  MG  --  2.4 2.3  --   --   --   --   --   --   --    < > = values in this interval not displayed.   GFR Estimated Creatinine Clearance: 42.5 mL/min (by C-G formula based on SCr of 1.05 mg/dL). Liver Function Tests: Recent Labs  Lab 02/02/20 1959  AST 55*  ALT 28  ALKPHOS 91  BILITOT 1.9*  PROT 7.9  ALBUMIN 4.2   No results for input(s): LIPASE, AMYLASE in the last 168 hours. No results for input(s): AMMONIA in the last 168 hours. Coagulation profile No results for input(s): INR, PROTIME in the last 168 hours.  CBC: Recent Labs  Lab 02/02/20 1959 02/03/20 0542 02/04/20 0503 02/05/20 0419 02/06/20 0536  WBC 13.6* 10.7* 12.3* 8.4 10.2  NEUTROABS 10.6*  --   --   --   --   HGB 15.8 14.1 14.1 13.2 14.2  HCT 42.9 38.1* 38.9* 37.3* 40.4  MCV 95.5 96.7 96.8 99.7 101.8*  PLT 254 222 235 222 211   Cardiac Enzymes: No results for input(s): CKTOTAL, CKMB, CKMBINDEX, TROPONINI in the last 168 hours. BNP: Invalid input(s): POCBNP CBG: No results for input(s): GLUCAP in the last 168 hours. D-Dimer No results for input(s): DDIMER in the last 72 hours. Hgb A1c No results for input(s): HGBA1C in the last 72 hours. Lipid Profile No results for input(s): CHOL, HDL, LDLCALC, TRIG, CHOLHDL, LDLDIRECT in the last 72 hours. Thyroid function studies No results for input(s): TSH, T4TOTAL, T3FREE, THYROIDAB in the last 72 hours.  Invalid input(s): FREET3 Anemia work up No results for input(s): VITAMINB12, FOLATE, FERRITIN, TIBC,  IRON, RETICCTPCT in the last 72 hours. Microbiology Recent Results (from the past 240 hour(s))  Respiratory Panel by RT PCR (Flu  A&B, Covid) - Nasopharyngeal Swab     Status: None   Collection Time: 02/02/20  8:55 PM   Specimen: Nasopharyngeal Swab  Result Value Ref Range Status   SARS Coronavirus 2 by RT PCR NEGATIVE NEGATIVE Final    Comment: (NOTE) SARS-CoV-2 target nucleic acids are NOT DETECTED.  The SARS-CoV-2 RNA is generally detectable in upper respiratoy specimens during the acute phase of infection. The lowest concentration of SARS-CoV-2 viral copies this assay can detect is 131 copies/mL. A negative result does not preclude SARS-Cov-2 infection and should not be used as the sole basis for treatment or other patient management decisions. A negative result may occur with  improper specimen collection/handling, submission of specimen other than nasopharyngeal swab, presence of viral mutation(s) within the areas targeted by this assay, and inadequate number of viral copies (<131 copies/mL). A negative result must be combined with clinical observations, patient history, and epidemiological information. The expected result is Negative.  Fact Sheet for Patients:  PinkCheek.be  Fact Sheet for Healthcare Providers:  GravelBags.it  This test is no t yet approved or cleared by the [place] FDA and  has been authorized for detection and/or diagnosis of SARS-CoV-2 by FDA under an Emergency Use Authorization (EUA). This EUA will remain  in effect (meaning this test can be used) for the duration of the COVID-19 declaration under Section 564(b)(1) of the Act, 21 U.S.C. section 360bbb-3(b)(1), unless the authorization is terminated or revoked sooner.     Influenza A by PCR NEGATIVE NEGATIVE Final   Influenza B by PCR NEGATIVE NEGATIVE Final    Comment: (NOTE) The Xpert Xpress SARS-CoV-2/FLU/RSV assay is intended as an  aid in  the diagnosis of influenza from Nasopharyngeal swab specimens and  should not be used as a sole basis for treatment. Nasal washings and  aspirates are unacceptable for Xpert Xpress SARS-CoV-2/FLU/RSV  testing.  Fact Sheet for Patients: PinkCheek.be  Fact Sheet for Healthcare Providers: GravelBags.it  This test is not yet approved or cleared by the [place] FDA and  has been authorized for detection and/or diagnosis of SARS-CoV-2 by  FDA under an Emergency Use Authorization (EUA). This EUA will remain  in effect (meaning this test can be used) for the duration of the  Covid-19 declaration under Section 564(b)(1) of the Act, 21  U.S.C. section 360bbb-3(b)(1), unless the authorization is  terminated or revoked. Performed at [facility], [address], [place], [place] [address]      Discharge Instructions:   Discharge Instructions    Diet - low sodium heart healthy   Complete by: As directed    Increase activity slowly   Complete by: As directed      Allergies as of 02/08/2020   No Known Allergies     Medication List    STOP taking these medications   Eliquis 5 MG Tabs tablet Generic drug: apixaban     TAKE these medications   acetaminophen 325 MG tablet Commonly known as: TYLENOL Take 2 tablets (650 mg total) by mouth every 6 (six) hours as needed for mild pain. What changed:   when to take this  reasons to take this   amitriptyline 25 MG tablet Commonly known as: ELAVIL Take 25 mg by mouth at bedtime.   CALCIUM 500 + D PO Take 1 tablet by mouth daily.   cholecalciferol 25 MCG (1000 UNIT) tablet Commonly known as: VITAMIN D3 Take 2,000 Units by mouth daily.   ferrous sulfate 325 (65 FE) MG EC  tablet Take 325 mg by mouth daily with breakfast.   gabapentin 100 MG capsule Commonly known as: NEURONTIN TAKE 1 CAPSULE IN THE MORNING AND TWO AT NIGHT   METAMUCIL PO Take 1  Dose by mouth daily.   MIRALAX PO Take 1 Dose by mouth daily. Heaping teaspoon   MULTIVITAMIN ADULT PO Take 1 tablet by mouth daily.   potassium chloride 10 MEQ tablet Commonly known as: KLOR-CON Take 2 tablets (20 mEq total) by mouth as directed. Take 4 tablets (40 mEq) daily for 5 days. Then go to 2 tablets (20 mEq) daily. What changed: how much to take   pyridOXINE 100 MG tablet Commonly known as: VITAMIN B-6 Take 100 mg by mouth daily.   sodium chloride 1 g tablet Take 1 tablet (1 g total) by mouth 2 (two) times daily with a meal.   tamsulosin 0.4 MG Caps capsule Commonly known as: FLOMAX Take 0.4 mg by mouth daily after breakfast.   torsemide 20 MG tablet Commonly known as: DEMADEX Take 40 mg (2 tablets) daily. What changed: additional instructions   vitamin C 1000 MG tablet Take 1,000 mg by mouth daily.   Vitamin E 400 units Tabs Take 1 tablet by mouth daily.       Contact information for after-discharge care    Destination    [facility] Preferred SNF .   Service: Skilled Nursing Contact information: [address] [place] [phone]                   Time coordinating discharge: 32 minutes  Signed:  Jennye Boroughs  [facility] 02/08/2020, 11:58 AM   Pager on [email or web address]. If 7PM-7AM, please contact night-coverage at [email or web address]

## 2020-02-08 NOTE — Plan of Care (Signed)

## 2020-02-08 NOTE — Care Management Important Message (Signed)
Important Message  Patient Details  Name: Douglas Calhoun MRN: [id number] Date of Birth: 1920/04/19   Medicare Important Message Given:  Yes     Dannette Barbara 02/08/2020, 2:51 PM

## 2020-02-08 NOTE — TOC Transition Note (Signed)
Transition of Care [facility]) - CM/SW Discharge Note   Patient Details  Name: Douglas Calhoun MRN: [id number] Date of Birth: 1919-09-05  Transition of Care [facility]) CM/SW Contact:  Eileen Stanford, LCSW Phone Number: 02/08/2020, 1:04 PM   Clinical Narrative:  Clinical Social Worker facilitated patient discharge including contacting patient family and facility to confirm patient discharge plans.  Clinical information faxed to facility and family agreeable with plan.  CSW arranged ambulance transport via [facility] to [facility] .  RN to call [phone] for report prior to discharge.     Final next level of care: Skilled Nursing Facility Barriers to Discharge: No Barriers Identified   Patient Goals and CMS Choice Patient states their goals for this hospitalization and ongoing recovery are:: [facility]   Choice offered to / list presented to : Patient  Discharge Placement              Patient chooses bed at: [facility]  Patient to be transferred to facility by: [facility] Name of family member notified: Pt Patient and family notified of of transfer: 02/08/20  Discharge Plan and Services In-house Referral: NA   Post Acute Care Choice: [place]                               Social Determinants of Health (SDOH) Interventions     Readmission Risk Interventions No flowsheet data found.

## 2020-02-12 DIAGNOSIS — I509 Heart failure, unspecified: Secondary | ICD-10-CM | POA: Diagnosis not present

## 2020-02-12 DIAGNOSIS — I4891 Unspecified atrial fibrillation: Secondary | ICD-10-CM | POA: Diagnosis not present

## 2020-02-12 DIAGNOSIS — I110 Hypertensive heart disease with heart failure: Secondary | ICD-10-CM | POA: Diagnosis not present

## 2020-02-12 DIAGNOSIS — R296 Repeated falls: Secondary | ICD-10-CM | POA: Diagnosis not present

## 2020-02-12 DIAGNOSIS — R531 Weakness: Secondary | ICD-10-CM | POA: Diagnosis not present

## 2020-02-22 DIAGNOSIS — E871 Hypo-osmolality and hyponatremia: Secondary | ICD-10-CM | POA: Diagnosis not present

## 2020-02-22 DIAGNOSIS — E876 Hypokalemia: Secondary | ICD-10-CM | POA: Diagnosis not present

## 2020-02-22 DIAGNOSIS — M6281 Muscle weakness (generalized): Secondary | ICD-10-CM | POA: Diagnosis not present

## 2020-02-22 DIAGNOSIS — I4891 Unspecified atrial fibrillation: Secondary | ICD-10-CM | POA: Diagnosis not present

## 2020-02-26 DIAGNOSIS — I110 Hypertensive heart disease with heart failure: Secondary | ICD-10-CM | POA: Diagnosis not present

## 2020-02-26 DIAGNOSIS — E261 Secondary hyperaldosteronism: Secondary | ICD-10-CM | POA: Diagnosis not present

## 2020-02-26 DIAGNOSIS — I5030 Unspecified diastolic (congestive) heart failure: Secondary | ICD-10-CM | POA: Diagnosis not present

## 2020-02-26 DIAGNOSIS — Z7901 Long term (current) use of anticoagulants: Secondary | ICD-10-CM | POA: Diagnosis not present

## 2020-02-26 DIAGNOSIS — Z950 Presence of cardiac pacemaker: Secondary | ICD-10-CM | POA: Diagnosis not present

## 2020-02-26 DIAGNOSIS — I739 Peripheral vascular disease, unspecified: Secondary | ICD-10-CM | POA: Diagnosis not present

## 2020-02-26 DIAGNOSIS — I4891 Unspecified atrial fibrillation: Secondary | ICD-10-CM | POA: Diagnosis not present

## 2020-02-26 DIAGNOSIS — R531 Weakness: Secondary | ICD-10-CM | POA: Diagnosis not present

## 2020-02-26 DIAGNOSIS — R2681 Unsteadiness on feet: Secondary | ICD-10-CM | POA: Diagnosis not present

## 2020-03-03 ENCOUNTER — Telehealth: Payer: Self-pay | Admitting: Family Medicine

## 2020-03-03 ENCOUNTER — Ambulatory Visit: Payer: Medicare Other | Admitting: Cardiology

## 2020-03-03 NOTE — Telephone Encounter (Signed)
He is supposed to be taking potassium 10 mEq twice a day. The sodium was just for a 5 day course when he was discharged from the hospital in October. He should not still be taking it. Douglas Calhoun

## 2020-03-03 NOTE — Telephone Encounter (Signed)
Jonelle Sidle with [facility] is calling and would like dr Risk manager to consult with Sammuel Bailiff who is on [place] . Pt [facility] on 02-23-2020. Mardene Celeste the friend who gives the patient his medication received left over medication from [facility] and sodium chloride was not in the packet. On med list sodium chloride 1 gram one tablet twice a day on medication list also pt take potassium 10 meq once a day is not listed on medication list. The medication list was populated on 02-23-2020. Please advise concerning whether pt should be taking potassium 10 meq and sodium chloride 1 gram one tablet twice a day. Pt does have any appt with dr Caryn Section on 03-14-2020 for post hospital follow up

## 2020-03-04 NOTE — Telephone Encounter (Signed)
Douglas Calhoun as below and she verbalized understanding.

## 2020-03-05 DIAGNOSIS — I739 Peripheral vascular disease, unspecified: Secondary | ICD-10-CM | POA: Diagnosis not present

## 2020-03-05 DIAGNOSIS — R2681 Unsteadiness on feet: Secondary | ICD-10-CM | POA: Diagnosis not present

## 2020-03-05 DIAGNOSIS — I5030 Unspecified diastolic (congestive) heart failure: Secondary | ICD-10-CM | POA: Diagnosis not present

## 2020-03-05 DIAGNOSIS — R531 Weakness: Secondary | ICD-10-CM | POA: Diagnosis not present

## 2020-03-05 DIAGNOSIS — Z950 Presence of cardiac pacemaker: Secondary | ICD-10-CM | POA: Diagnosis not present

## 2020-03-05 DIAGNOSIS — E261 Secondary hyperaldosteronism: Secondary | ICD-10-CM | POA: Diagnosis not present

## 2020-03-05 DIAGNOSIS — Z7901 Long term (current) use of anticoagulants: Secondary | ICD-10-CM | POA: Diagnosis not present

## 2020-03-05 DIAGNOSIS — I4891 Unspecified atrial fibrillation: Secondary | ICD-10-CM | POA: Diagnosis not present

## 2020-03-05 DIAGNOSIS — I110 Hypertensive heart disease with heart failure: Secondary | ICD-10-CM | POA: Diagnosis not present

## 2020-03-07 DIAGNOSIS — Z950 Presence of cardiac pacemaker: Secondary | ICD-10-CM | POA: Diagnosis not present

## 2020-03-07 DIAGNOSIS — I4891 Unspecified atrial fibrillation: Secondary | ICD-10-CM | POA: Diagnosis not present

## 2020-03-07 DIAGNOSIS — I110 Hypertensive heart disease with heart failure: Secondary | ICD-10-CM | POA: Diagnosis not present

## 2020-03-07 DIAGNOSIS — E261 Secondary hyperaldosteronism: Secondary | ICD-10-CM | POA: Diagnosis not present

## 2020-03-07 DIAGNOSIS — I5030 Unspecified diastolic (congestive) heart failure: Secondary | ICD-10-CM | POA: Diagnosis not present

## 2020-03-07 DIAGNOSIS — Z7901 Long term (current) use of anticoagulants: Secondary | ICD-10-CM | POA: Diagnosis not present

## 2020-03-07 DIAGNOSIS — R531 Weakness: Secondary | ICD-10-CM | POA: Diagnosis not present

## 2020-03-07 DIAGNOSIS — R2681 Unsteadiness on feet: Secondary | ICD-10-CM | POA: Diagnosis not present

## 2020-03-07 DIAGNOSIS — I739 Peripheral vascular disease, unspecified: Secondary | ICD-10-CM | POA: Diagnosis not present

## 2020-03-11 DIAGNOSIS — R2681 Unsteadiness on feet: Secondary | ICD-10-CM | POA: Diagnosis not present

## 2020-03-11 DIAGNOSIS — Z7901 Long term (current) use of anticoagulants: Secondary | ICD-10-CM | POA: Diagnosis not present

## 2020-03-11 DIAGNOSIS — I5030 Unspecified diastolic (congestive) heart failure: Secondary | ICD-10-CM | POA: Diagnosis not present

## 2020-03-11 DIAGNOSIS — R531 Weakness: Secondary | ICD-10-CM | POA: Diagnosis not present

## 2020-03-11 DIAGNOSIS — I739 Peripheral vascular disease, unspecified: Secondary | ICD-10-CM | POA: Diagnosis not present

## 2020-03-11 DIAGNOSIS — Z950 Presence of cardiac pacemaker: Secondary | ICD-10-CM | POA: Diagnosis not present

## 2020-03-11 DIAGNOSIS — I110 Hypertensive heart disease with heart failure: Secondary | ICD-10-CM | POA: Diagnosis not present

## 2020-03-11 DIAGNOSIS — E261 Secondary hyperaldosteronism: Secondary | ICD-10-CM | POA: Diagnosis not present

## 2020-03-11 DIAGNOSIS — I4891 Unspecified atrial fibrillation: Secondary | ICD-10-CM | POA: Diagnosis not present

## 2020-03-13 ENCOUNTER — Other Ambulatory Visit: Payer: Self-pay

## 2020-03-13 ENCOUNTER — Encounter: Payer: Self-pay | Admitting: Cardiology

## 2020-03-13 ENCOUNTER — Ambulatory Visit: Payer: Medicare Other | Admitting: Cardiology

## 2020-03-13 ENCOUNTER — Telehealth: Payer: Self-pay | Admitting: Family Medicine

## 2020-03-13 VITALS — BP 100/60 | HR 81 | Ht 66.0 in | Wt 189.2 lb

## 2020-03-13 DIAGNOSIS — I4821 Permanent atrial fibrillation: Secondary | ICD-10-CM

## 2020-03-13 DIAGNOSIS — I5022 Chronic systolic (congestive) heart failure: Secondary | ICD-10-CM | POA: Diagnosis not present

## 2020-03-13 DIAGNOSIS — R531 Weakness: Secondary | ICD-10-CM | POA: Diagnosis not present

## 2020-03-13 DIAGNOSIS — I739 Peripheral vascular disease, unspecified: Secondary | ICD-10-CM | POA: Diagnosis not present

## 2020-03-13 DIAGNOSIS — E261 Secondary hyperaldosteronism: Secondary | ICD-10-CM | POA: Diagnosis not present

## 2020-03-13 DIAGNOSIS — I495 Sick sinus syndrome: Secondary | ICD-10-CM

## 2020-03-13 DIAGNOSIS — R2681 Unsteadiness on feet: Secondary | ICD-10-CM | POA: Diagnosis not present

## 2020-03-13 DIAGNOSIS — I5030 Unspecified diastolic (congestive) heart failure: Secondary | ICD-10-CM | POA: Diagnosis not present

## 2020-03-13 DIAGNOSIS — Z7901 Long term (current) use of anticoagulants: Secondary | ICD-10-CM | POA: Diagnosis not present

## 2020-03-13 DIAGNOSIS — Z950 Presence of cardiac pacemaker: Secondary | ICD-10-CM | POA: Diagnosis not present

## 2020-03-13 DIAGNOSIS — I110 Hypertensive heart disease with heart failure: Secondary | ICD-10-CM | POA: Diagnosis not present

## 2020-03-13 DIAGNOSIS — I4891 Unspecified atrial fibrillation: Secondary | ICD-10-CM | POA: Diagnosis not present

## 2020-03-13 MED ORDER — METOLAZONE 2.5 MG PO TABS: 2.5000 mg | ORAL_TABLET | Freq: Every day | ORAL | 3 refills | Status: DC

## 2020-03-13 MED ORDER — POTASSIUM CHLORIDE ER 20 MEQ PO TBCR: EXTENDED_RELEASE_TABLET | ORAL | 5 refills | Status: DC

## 2020-03-13 MED ORDER — TORSEMIDE 20 MG PO TABS: ORAL_TABLET | ORAL | 3 refills | Status: DC

## 2020-03-13 NOTE — Patient Instructions (Signed)
Medication Instructions:  Your physician has recommended you make the following change in your medication:   1.  START taking metolazone (ZAROXOLYN) 2.5 MG tablet: one tablet by mouth daily. 2.  INCREASE your Potassium to 40 MEQ: 2 tablets by mouth daily. (I sent new RX for 20 MEQ tablets) 3.  DECREASE your torsemide (DEMADEX) 20 MG tablet: Take 2 tablets(40 MG) by mouth daily.   *If you need a refill on your cardiac medications before your next appointment, please call your pharmacy*   Lab Work:  Your physician recommends that you return for lab work in: in 5 days (03/18/20)  If you have labs (blood work) drawn today and your tests are completely normal, you will receive your results only by: Marland Kitchen MyChart Message (if you have MyChart) OR . A paper copy in the mail If you have any lab test that is abnormal or we need to change your treatment, we will call you to review the results.   Testing/Procedures: None Ordered   Follow-Up: At [facility], you and your health needs are our priority.  As part of our continuing mission to provide you with exceptional heart care, we have created designated Provider Care Teams.  These Care Teams include your primary Cardiologist (physician) and Advanced Practice Providers (APPs -  Physician Assistants and Nurse Practitioners) who all work together to provide you with the care you need, when you need it.  We recommend signing up for the patient portal called "MyChart".  Sign up information is provided on this After Visit Summary.  MyChart is used to connect with patients for Virtual Visits (Telemedicine).  Patients are able to view lab/test results, encounter notes, upcoming appointments, etc.  Non-urgent messages can be sent to your provider as well.   To learn more about what you can do with MyChart, go to [email or web address].    Your next appointment:   2 month(s)  The format for your next appointment:   In Person  Provider:   Kate Sable, MD   Other Instructions

## 2020-03-13 NOTE — Telephone Encounter (Signed)
That's fine, thanks!

## 2020-03-13 NOTE — Telephone Encounter (Signed)
Home Health Verbal Orders - Caller/Agency: Gari Crown, [facility]  Callback Number: [phone] Requesting OT/PT/Skilled Nursing/Social Work/Speech Therapy: Speech therapy  Frequency: 1w5

## 2020-03-13 NOTE — Progress Notes (Signed)
Cardiology Office Note:    Date:  03/13/2020   ID:  Douglas Calhoun, DOB 10-19-1919, MRN [id number]  PCP:  Birdie Sons, MD  Cardiologist:  Kate Sable, MD  Electrophysiologist:  None   Referring MD: Birdie Sons, MD   Chief Complaint  Patient presents with  . other    F/u hospital weakness c/o increase of sob and pt fell and is having chest/abd pain. Meds reviewed verbally with pt.    History of Present Illness:    Douglas Calhoun is a 86 y.o. male with a hx of atrial fibrillation on Eliquis, sick sinus syndrome status post leadless pacemaker 01/11/2019,  heart failure reduced ejection fraction, last EF 25% on 02/05/2019, who presents for follow-up.    He is being seen for A. fib, congestive heart failure and lower extremity edema.  Eliquis was stopped after last visit due to frequent falls.  His goal weight is around 183 to 187 pounds.  Recently admitted with community-acquired pneumonia, treated with IV antibiotics.  He states his abdomen feels a little bit fuller today.  Has noticed some weight gain.   Historical notes Patient was diagnosed with lumbar compression fracture in November 2020.  He was admitted at [facility].  Surgery was not performed due to his age and medical management recommended.  He was noted to be fluid overloaded at the time and was adequately diuresed.  On discharge, his weight was 180 pounds.   He has urinary obstruction and self caths themselves.  He denies any history of heart attacks or CAD.  Last echocardiogram date 02/05/2019 revealed severely reduced ejection fraction with EF 25%.  Moderately dilated left ventricle, moderately dilated right ventricle.  Patient with recently having unsteady gait.  Has fallen 2 times over the past several weeks.  His neighbor saw him hitting his head.  Has a bruise in his right elbow.   Past Medical History:  Diagnosis Date  . Achilles tendinitis   . Cardiomyopathy ([place])    a. 01/2019 Echo: EF 25%, glob HK.   . Cataract   . Chronic HFrEF (heart failure with reduced ejection fraction) ([place])    a. 03/2018 Echo: EF 45-50%); b. 01/2019 Echo: EF 25%. Glob HK. Triv AI/PR, Mod TR. RVSP 57.90mmHg.  Marland Kitchen Hearing loss   . Herpes zoster without complication 3/47/4259  . Permanent atrial fibrillation (HCC)    a. CHA2DS2VASc = 4-->Eliquis.  . Personal history of prostate cancer 10/08/2014   s/p 15 radiation treatments treated by Dr. Madelin Headings   . SSS (sick sinus syndrome) (HCC)    a. s/p MDT Micra AV leadless PPM (ser # [id number]).  . Varicose vein of leg     Past Surgical History:  Procedure Laterality Date  . APPENDECTOMY  1960's  . BREAST SURGERY     Breast Biopsy prior to 1960  . CATARACT EXTRACTION Left   . PACEMAKER LEADLESS INSERTION N/A 01/11/2019   Procedure: PACEMAKER LEADLESS INSERTION;  Surgeon: Isaias Cowman, MD;  Location: [facility];  Service: Cardiovascular;  Laterality: N/A;    Current Medications: Current Meds  Medication Sig  . acetaminophen (TYLENOL) 325 MG tablet Take 2 tablets (650 mg total) by mouth every 6 (six) hours as needed for mild pain.  Marland Kitchen amitriptyline (ELAVIL) 25 MG tablet Take 25 mg by mouth at bedtime.  . Ascorbic Acid (VITAMIN C) 1000 MG tablet Take 1,000 mg by mouth daily.  . Calcium Carbonate-Vitamin D (CALCIUM 500 + D PO) Take 1 tablet by  mouth daily.  . cholecalciferol (VITAMIN D3) 25 MCG (1000 UT) tablet Take 2,000 Units by mouth daily.   . ferrous sulfate 325 (65 FE) MG EC tablet Take 325 mg by mouth daily with breakfast.  . gabapentin (NEURONTIN) 100 MG capsule TAKE 1 CAPSULE IN THE MORNING AND TWO AT NIGHT  . Multiple Vitamins-Minerals (MULTIVITAMIN ADULT PO) Take 1 tablet by mouth daily.  . Polyethylene Glycol 3350 (MIRALAX PO) Take 1 Dose by mouth daily. Heaping teaspoon  . potassium chloride 20 MEQ TBCR Take 2 tablets (40 MEQ) by mouth daily.  . Psyllium (METAMUCIL PO) Take 1 Dose by mouth daily.  Marland Kitchen pyridOXINE (VITAMIN B-6) 100 MG tablet  Take 100 mg by mouth daily.  . sodium chloride 1 g tablet Take 1 tablet (1 g total) by mouth 2 (two) times daily with a meal.  . tamsulosin (FLOMAX) 0.4 MG CAPS capsule Take 0.4 mg by mouth daily after breakfast.   . torsemide (DEMADEX) 20 MG tablet Take 40 mg (2 tablets) daily.  . Vitamin E 400 units TABS Take 1 tablet by mouth daily.  . [DISCONTINUED] potassium chloride (KLOR-CON) 10 MEQ tablet Take 2 tablets (20 mEq total) by mouth as directed. Take 4 tablets (40 mEq) daily for 5 days. Then go to 2 tablets (20 mEq) daily. (Patient taking differently: Take 20 mEq by mouth 2 (two) times daily. )  . [DISCONTINUED] torsemide (DEMADEX) 20 MG tablet Take 40 mg (2 tablets) daily. (Patient taking differently: 40 mg 2 (two) times daily. )     Allergies:   Patient has no known allergies.   Social History   Socioeconomic History  . Marital status: Legally Separated    Spouse name: Not on file  . Number of children: 1  . Years of education: Not on file  . Highest education level: Bachelor's degree (e.g., BA, AB, BS)  Occupational History  . Occupation: retired  Tobacco Use  . Smoking status: Never Smoker  . Smokeless tobacco: Never Used  Vaping Use  . Vaping Use: Never used  Substance and Sexual Activity  . Alcohol use: Not Currently    Alcohol/week: 0.0 standard drinks  . Drug use: No  . Sexual activity: Not on file  Other Topics Concern  . Not on file  Social History Narrative   Lives along at [place] Strain: Low Risk   . Difficulty of Paying Living Expenses: Not hard at all  Food Insecurity: No Food Insecurity  . Worried About Charity fundraiser in the Last Year: Never true  . Ran Out of Food in the Last Year: Never true  Transportation Needs: No Transportation Needs  . Lack of Transportation (Medical): No  . Lack of Transportation (Non-Medical): No  Physical Activity: Inactive  . Days of Exercise per Week: 0 days  . Minutes  of Exercise per Session: 0 min  Stress: No Stress Concern Present  . Feeling of Stress : Not at all  Social Connections: Socially Isolated  . Frequency of Communication with Friends and Family: Once a week  . Frequency of Social Gatherings with Friends and Family: More than three times a week  . Attends Religious Services: Never  . Active Member of Clubs or Organizations: No  . Attends Archivist Meetings: Never  . Marital Status: Separated     Family History: The patient's family history includes Alcoholism in his son; Heart disease in his mother; Stroke in his mother.  ROS:   Please see the history of present illness.     All other systems reviewed and are negative.  EKGs/Labs/Other Studies Reviewed:    The following studies were reviewed today: TTEchocardiogram date 02/05/2019 revealed severely reduced ejection fraction with EF 25%.   Moderately dilated left ventricle,  moderately dilated right ventricle. Moderate MR Moderate TR  EKG:  EKG is obtained today. ekg shows atrial fib  Recent Labs: 02/02/2020: ALT 28 02/03/2020: Magnesium 2.3 02/06/2020: Hemoglobin 14.2; Platelets 211 02/07/2020: BUN 26; Creatinine, Ser 1.05; Potassium 3.6; Sodium 132  Recent Lipid Panel    Component Value Date/Time   CHOL 231 (A) 03/06/2013 0000   TRIG 98 03/06/2013 0000   HDL 78 (A) 03/06/2013 0000   LDLCALC 133 03/06/2013 0000    Physical Exam:    VS:  BP 100/60 (BP Location: Left Arm, Patient Position: Sitting, Cuff Size: Normal)   Pulse 81   Ht 5\' 6"  (1.676 m)   Wt 189 lb 4 oz (85.8 kg)   SpO2 97%   BMI 30.55 kg/m     Wt Readings from Last 3 Encounters:  03/13/20 189 lb 4 oz (85.8 kg)  02/08/20 185 lb 10 oz (84.2 kg)  02/01/20 185 lb (83.9 kg)     GEN:  Well nourished, well developed in mild distress due to back pain, frail looking HEENT: Normal NECK: No JVD; No carotid bruits LYMPHATICS: No lymphadenopathy CARDIAC: Irregular irregular, no murmurs, rubs,  gallops RESPIRATORY: Decreased breath sounds at bases ABDOMEN: Soft, non-tender, distended MUSCULOSKELETAL: No edema; No deformity  SKIN: Warm and dry NEUROLOGIC:  Alert and oriented x 3 PSYCHIATRIC:  Normal affect    ASSESSMENT:    1. Chronic systolic heart failure ([place])   2. Permanent atrial fibrillation ([place])   3. SSS (sick sinus syndrome) (HCC)    PLAN:    In order of problems listed above:  1. Patient with history of severely reduced EF, last EF 25%.  Not on CHF meds due to low blood pressures.  Start metolazone 2.5 mg daily, decrease torsemide to 40 mg daily.  Add KCl 40 mEq daily.  Get BMP today.  Repeat BMP in 5 days.  Further medication adjustments pending patient symptoms and lab work. 2. Permanent atrial fibrillation.  Heart rate controlled, not on anticoagulation due to falls.. 3. Sick sinus syndrome s/p leadless pacemaker.  Keep appointment with EP/device clinic for frequent checks.  Follow-up in 2 months.  This note was generated in part or whole with voice recognition software. Voice recognition is usually quite accurate but there are transcription errors that can and very often do occur. I apologize for any typographical errors that were not detected and corrected.  Medication Adjustments/Labs and Tests Ordered: Current medicines are reviewed at length with the patient today.  Concerns regarding medicines are outlined above.  Orders Placed This Encounter  Procedures  . Basic metabolic panel  . Basic metabolic panel  . EKG 12-Lead   Meds ordered this encounter  Medications  . potassium chloride 20 MEQ TBCR    Sig: Take 2 tablets (40 MEQ) by mouth daily.    Dispense:  60 tablet    Refill:  5    FOR FUTURE REFILLS PLEASE  . metolazone (ZAROXOLYN) 2.5 MG tablet    Sig: Take 1 tablet (2.5 mg total) by mouth daily.    Dispense:  30 tablet    Refill:  3  . torsemide (DEMADEX) 20 MG tablet    Sig: Take 40 mg (  2 tablets) daily.    Dispense:  90 tablet    Refill:   3    Dosage increase    Patient Instructions  Medication Instructions:  Your physician has recommended you make the following change in your medication:   1.  START taking metolazone (ZAROXOLYN) 2.5 MG tablet: one tablet by mouth daily. 2.  INCREASE your Potassium to 40 MEQ: 2 tablets by mouth daily. (I sent new RX for 20 MEQ tablets) 3.  DECREASE your torsemide (DEMADEX) 20 MG tablet: Take 2 tablets(40 MG) by mouth daily.   *If you need a refill on your cardiac medications before your next appointment, please call your pharmacy*   Lab Work:  Your physician recommends that you return for lab work in: in 5 days (03/18/20)  If you have labs (blood work) drawn today and your tests are completely normal, you will receive your results only by: Marland Kitchen MyChart Message (if you have MyChart) OR . A paper copy in the mail If you have any lab test that is abnormal or we need to change your treatment, we will call you to review the results.   Testing/Procedures: None Ordered   Follow-Up: At [facility], you and your health needs are our priority.  As part of our continuing mission to provide you with exceptional heart care, we have created designated Provider Care Teams.  These Care Teams include your primary Cardiologist (physician) and Advanced Practice Providers (APPs -  Physician Assistants and Nurse Practitioners) who all work together to provide you with the care you need, when you need it.  We recommend signing up for the patient portal called "MyChart".  Sign up information is provided on this After Visit Summary.  MyChart is used to connect with patients for Virtual Visits (Telemedicine).  Patients are able to view lab/test results, encounter notes, upcoming appointments, etc.  Non-urgent messages can be sent to your provider as well.   To learn more about what you can do with MyChart, go to [email or web address].    Your next appointment:   2 month(s)  The format for your next  appointment:   In Person  Provider:   Kate Sable, MD   Other Instructions      Signed, Kate Sable, MD  03/13/2020 12:51 PM    [place]

## 2020-03-14 ENCOUNTER — Ambulatory Visit
Admission: RE | Admit: 2020-03-14 | Discharge: 2020-03-14 | Disposition: A | Discharge status: Home / Self Care | Payer: Medicare Other | Source: Ambulatory Visit | Attending: Family Medicine | Admitting: Family Medicine

## 2020-03-14 ENCOUNTER — Telehealth: Payer: Self-pay | Admitting: Family Medicine

## 2020-03-14 ENCOUNTER — Ambulatory Visit (INDEPENDENT_AMBULATORY_CARE_PROVIDER_SITE_OTHER): Payer: Medicare Other | Admitting: Family Medicine

## 2020-03-14 ENCOUNTER — Ambulatory Visit
Admission: RE | Admit: 2020-03-14 | Discharge: 2020-03-14 | Disposition: A | Discharge status: Home / Self Care | Payer: Medicare Other | Attending: Family Medicine | Admitting: Family Medicine

## 2020-03-14 ENCOUNTER — Encounter: Payer: Self-pay | Admitting: Family Medicine

## 2020-03-14 VITALS — BP 112/76 | HR 52 | Resp 24 | Ht 68.0 in | Wt 191.0 lb

## 2020-03-14 DIAGNOSIS — R0781 Pleurodynia: Secondary | ICD-10-CM | POA: Insufficient documentation

## 2020-03-14 DIAGNOSIS — J189 Pneumonia, unspecified organism: Secondary | ICD-10-CM | POA: Diagnosis not present

## 2020-03-14 DIAGNOSIS — I5030 Unspecified diastolic (congestive) heart failure: Secondary | ICD-10-CM | POA: Diagnosis not present

## 2020-03-14 DIAGNOSIS — R531 Weakness: Secondary | ICD-10-CM | POA: Diagnosis not present

## 2020-03-14 DIAGNOSIS — Z950 Presence of cardiac pacemaker: Secondary | ICD-10-CM | POA: Diagnosis not present

## 2020-03-14 DIAGNOSIS — M810 Age-related osteoporosis without current pathological fracture: Secondary | ICD-10-CM | POA: Diagnosis not present

## 2020-03-14 DIAGNOSIS — S2242XA Multiple fractures of ribs, left side, initial encounter for closed fracture: Secondary | ICD-10-CM | POA: Diagnosis not present

## 2020-03-14 DIAGNOSIS — E261 Secondary hyperaldosteronism: Secondary | ICD-10-CM | POA: Diagnosis not present

## 2020-03-14 DIAGNOSIS — D689 Coagulation defect, unspecified: Secondary | ICD-10-CM

## 2020-03-14 DIAGNOSIS — I4891 Unspecified atrial fibrillation: Secondary | ICD-10-CM | POA: Diagnosis not present

## 2020-03-14 DIAGNOSIS — W19XXXA Unspecified fall, initial encounter: Secondary | ICD-10-CM

## 2020-03-14 DIAGNOSIS — I110 Hypertensive heart disease with heart failure: Secondary | ICD-10-CM | POA: Diagnosis not present

## 2020-03-14 DIAGNOSIS — I739 Peripheral vascular disease, unspecified: Secondary | ICD-10-CM | POA: Diagnosis not present

## 2020-03-14 DIAGNOSIS — C61 Malignant neoplasm of prostate: Secondary | ICD-10-CM

## 2020-03-14 DIAGNOSIS — Z7901 Long term (current) use of anticoagulants: Secondary | ICD-10-CM | POA: Diagnosis not present

## 2020-03-14 DIAGNOSIS — I700 Atherosclerosis of aorta: Secondary | ICD-10-CM | POA: Diagnosis not present

## 2020-03-14 DIAGNOSIS — R2681 Unsteadiness on feet: Secondary | ICD-10-CM | POA: Diagnosis not present

## 2020-03-14 HISTORY — DX: Malignant neoplasm of prostate: C61

## 2020-03-14 LAB — BASIC METABOLIC PANEL
BUN/Creatinine Ratio: 28 — ABNORMAL HIGH (ref 10–24)
BUN: 29 mg/dL (ref 10–36)
CO2: 24 mmol/L (ref 20–29)
Calcium: 9 mg/dL (ref 8.6–10.2)
Chloride: 97 mmol/L (ref 96–106)
Creatinine, Ser: 1.05 mg/dL (ref 0.76–1.27)
GFR calc Af Amer: 68 mL/min/{1.73_m2} (ref >59)
GFR calc non Af Amer: 59 mL/min/{1.73_m2} — ABNORMAL LOW (ref >59)
Glucose: 105 mg/dL — ABNORMAL HIGH (ref 65–99)
Potassium: 3.8 mmol/L (ref 3.5–5.2)
Sodium: 136 mmol/L (ref 134–144)

## 2020-03-14 NOTE — Telephone Encounter (Signed)
OK for OTC 2/wk x 3 week.

## 2020-03-14 NOTE — Progress Notes (Signed)
Established patient visit   Patient: Douglas Calhoun   DOB: 04-04-1920   86 y.o. Male  MRN: [id number] Visit Date: 03/14/2020  Today's healthcare provider: Lelon Huh, MD   Chief Complaint  Patient presents with  . Hospitalization Follow-up   Subjective    HPI  Follow up Hospitalization  Patient was admitted to [facility] on 02/02/2020 and discharged on 02/08/2020 for CAP and CHF exacerbation .He was treated for weakness, hyponatremia, hypokalemia, pneumonia and dyspnea. He spent two weeks at [facility] and has been home for two weeks. He had follow up with cardiology and was starting back on metolazone 2.5 mg daily, decrease torsemide to 40 mg daily.  Add KCl 40 mEq daily. He reports good compliance with treatment. He reports this condition is improved but stills gets short of breath with minimal exertion. Is using a walker.  He has also had a fall since he got home and fell on his left lateral rib which continues to hurt. He is does have home PT/OT and SLT to help with swallowing difficulties.       Medications: Outpatient Medications Prior to Visit  Medication Sig  . acetaminophen (TYLENOL) 325 MG tablet Take 2 tablets (650 mg total) by mouth every 6 (six) hours as needed for mild pain.  Marland Kitchen amitriptyline (ELAVIL) 25 MG tablet Take 25 mg by mouth at bedtime.  . Ascorbic Acid (VITAMIN C) 1000 MG tablet Take 1,000 mg by mouth daily.  . Calcium Carbonate-Vitamin D (CALCIUM 500 + D PO) Take 1 tablet by mouth daily.  . cholecalciferol (VITAMIN D3) 25 MCG (1000 UT) tablet Take 2,000 Units by mouth daily.   . ferrous sulfate 325 (65 FE) MG EC tablet Take 325 mg by mouth daily with breakfast.  . gabapentin (NEURONTIN) 100 MG capsule TAKE 1 CAPSULE IN THE MORNING AND TWO AT NIGHT  . metolazone (ZAROXOLYN) 2.5 MG tablet Take 1 tablet (2.5 mg total) by mouth daily.  . Multiple Vitamins-Minerals (MULTIVITAMIN ADULT PO) Take 1 tablet by mouth daily.  . Polyethylene Glycol 3350  (MIRALAX PO) Take 1 Dose by mouth daily. Heaping teaspoon  . potassium chloride 20 MEQ TBCR Take 2 tablets (40 MEQ) by mouth daily.  . Psyllium (METAMUCIL PO) Take 1 Dose by mouth daily.  Marland Kitchen pyridOXINE (VITAMIN B-6) 100 MG tablet Take 100 mg by mouth daily.  . sodium chloride 1 g tablet Take 1 tablet (1 g total) by mouth 2 (two) times daily with a meal.  . tamsulosin (FLOMAX) 0.4 MG CAPS capsule Take 0.4 mg by mouth daily after breakfast.   . torsemide (DEMADEX) 20 MG tablet Take 40 mg (2 tablets) daily.  . Vitamin E 400 units TABS Take 1 tablet by mouth daily.   No facility-administered medications prior to visit.    Review of Systems  Constitutional: Negative.   Respiratory: Positive for shortness of breath.   Cardiovascular: Negative for chest pain, palpitations and leg swelling.  Musculoskeletal: Positive for arthralgias and myalgias.  Neurological: Negative for dizziness and headaches.      Objective    BP 112/76   Pulse (!) 52   Resp (!) 24   Ht 5\' 8"  (1.727 m)   Wt 191 lb (86.6 kg)   SpO2 96%   BMI 29.04 kg/m    Physical Exam   General: Appearance:     Well developed, well nourished male in no acute distress, but appearance of generalized weakness.   Eyes:    PERRL, conjunctiva/corneas  clear, EOM's intact       Lungs:     Clear to auscultation bilaterally, respirations unlabored  Heart:    Bradycardic. Irregularly irregular rhythm. No murmurs, rubs, or gallops.   MS:   All extremities are intact. Tender left antero-lateral ribs. No gross deformities.   Neurologic:   Awake, alert, oriented x 3.        Assessment & Plan     1. Community acquired pneumonia, unspecified laterality  - DG Chest 2 View; Future  2. Rib pain on left side  - DG Ribs Unilateral Left; Future  3. Coagulation disorder ([place]) Off Eliquis due to increased bleeding risk associated with frequent falls as per cardiology  4. Generalized weakness Continue home PT/PT  5. Fall, initial  encounter Rib xr as above.    Started on metolazone by cardiology yesterday, BMP planned on 03/18/20  No follow-ups on file.      The entirety of the information documented in the History of Present Illness, Review of Systems and Physical Exam were personally obtained by me. Portions of this information were initially documented by the CMA and reviewed by me for thoroughness and accuracy.      Lelon Huh, MD  [facility] [phone] (phone) [phone] (fax)  [place]

## 2020-03-14 NOTE — Telephone Encounter (Signed)
L/M advising Douglas Calhoun as below.

## 2020-03-14 NOTE — Telephone Encounter (Signed)
Called to request verbal orders for OT 2xwk 3.  Also called to inform the doctor of patient's fall last Friday.  She stated that the patient has a rib fracture on the left side and also a scab on his left knee that the patient believes comes from the fall.  Please advise and call if there are any questions at [phone]

## 2020-03-14 NOTE — Telephone Encounter (Signed)
L/M advising [place] below.

## 2020-03-14 NOTE — Telephone Encounter (Signed)
Please review. Thanks!  

## 2020-03-14 NOTE — Patient Instructions (Addendum)
.   Please review the attached list of medications and notify my office if there are any errors.   Daphane Shepherd to the [facility] on [facility] for chest and left rib Xray

## 2020-03-17 DIAGNOSIS — R531 Weakness: Secondary | ICD-10-CM | POA: Diagnosis not present

## 2020-03-17 DIAGNOSIS — I739 Peripheral vascular disease, unspecified: Secondary | ICD-10-CM | POA: Diagnosis not present

## 2020-03-17 DIAGNOSIS — I5030 Unspecified diastolic (congestive) heart failure: Secondary | ICD-10-CM | POA: Diagnosis not present

## 2020-03-17 DIAGNOSIS — Z7901 Long term (current) use of anticoagulants: Secondary | ICD-10-CM | POA: Diagnosis not present

## 2020-03-17 DIAGNOSIS — E261 Secondary hyperaldosteronism: Secondary | ICD-10-CM | POA: Diagnosis not present

## 2020-03-17 DIAGNOSIS — R2681 Unsteadiness on feet: Secondary | ICD-10-CM | POA: Diagnosis not present

## 2020-03-17 DIAGNOSIS — Z950 Presence of cardiac pacemaker: Secondary | ICD-10-CM | POA: Diagnosis not present

## 2020-03-17 DIAGNOSIS — I4891 Unspecified atrial fibrillation: Secondary | ICD-10-CM | POA: Diagnosis not present

## 2020-03-17 DIAGNOSIS — I110 Hypertensive heart disease with heart failure: Secondary | ICD-10-CM | POA: Diagnosis not present

## 2020-03-18 ENCOUNTER — Telehealth: Payer: Self-pay

## 2020-03-18 ENCOUNTER — Other Ambulatory Visit
Admission: RE | Admit: 2020-03-18 | Discharge: 2020-03-18 | Disposition: A | Discharge status: Home / Self Care | Payer: Medicare Other | Attending: Cardiology | Admitting: Cardiology

## 2020-03-18 ENCOUNTER — Other Ambulatory Visit: Payer: Self-pay

## 2020-03-18 DIAGNOSIS — I4891 Unspecified atrial fibrillation: Secondary | ICD-10-CM | POA: Diagnosis not present

## 2020-03-18 DIAGNOSIS — I5022 Chronic systolic (congestive) heart failure: Secondary | ICD-10-CM | POA: Insufficient documentation

## 2020-03-18 DIAGNOSIS — R2681 Unsteadiness on feet: Secondary | ICD-10-CM | POA: Diagnosis not present

## 2020-03-18 DIAGNOSIS — E261 Secondary hyperaldosteronism: Secondary | ICD-10-CM | POA: Diagnosis not present

## 2020-03-18 DIAGNOSIS — I739 Peripheral vascular disease, unspecified: Secondary | ICD-10-CM | POA: Diagnosis not present

## 2020-03-18 DIAGNOSIS — I5030 Unspecified diastolic (congestive) heart failure: Secondary | ICD-10-CM | POA: Diagnosis not present

## 2020-03-18 DIAGNOSIS — Z7901 Long term (current) use of anticoagulants: Secondary | ICD-10-CM | POA: Diagnosis not present

## 2020-03-18 DIAGNOSIS — I110 Hypertensive heart disease with heart failure: Secondary | ICD-10-CM | POA: Diagnosis not present

## 2020-03-18 DIAGNOSIS — Z950 Presence of cardiac pacemaker: Secondary | ICD-10-CM | POA: Diagnosis not present

## 2020-03-18 DIAGNOSIS — R531 Weakness: Secondary | ICD-10-CM | POA: Diagnosis not present

## 2020-03-18 LAB — BASIC METABOLIC PANEL
Anion gap: 14 (ref 5–15)
BUN: 57 mg/dL — ABNORMAL HIGH (ref 8–23)
CO2: 32 mmol/L (ref 22–32)
Calcium: 9.4 mg/dL (ref 8.9–10.3)
Chloride: 83 mmol/L — ABNORMAL LOW (ref 98–111)
Creatinine, Ser: 1.53 mg/dL — ABNORMAL HIGH (ref 0.61–1.24)
GFR, Estimated: 41 mL/min — ABNORMAL LOW (ref >60)
Glucose, Bld: 112 mg/dL — ABNORMAL HIGH (ref 70–99)
Potassium: 2.6 mmol/L — CL (ref 3.5–5.1)
Sodium: 129 mmol/L — ABNORMAL LOW (ref 135–145)

## 2020-03-18 NOTE — Telephone Encounter (Signed)
Received phone call from Seth Bake at [facility] reporting a critical K+ of 2.6. Spoke to [employer] and he recommended that the patient take an additional 80MEQ of his Potassium today and then a total 60 MEQ of potassium tomorrow. Patient has been taking a total of 40 MEQ daily.   Called caregiver/Neighbor Pat and left a VM with the instructions as outlined above. Will route to Dr.Agbor-Etang for further review and recommendation.

## 2020-03-18 NOTE — Telephone Encounter (Signed)
Pat called back and spoke with Darlyne Russian RN. She verified that she did get the VM and instructions for the Potassium replacement.

## 2020-03-19 ENCOUNTER — Telehealth: Payer: Self-pay

## 2020-03-19 DIAGNOSIS — I5022 Chronic systolic (congestive) heart failure: Secondary | ICD-10-CM

## 2020-03-19 MED ORDER — POTASSIUM CHLORIDE ER 20 MEQ PO TBCR: EXTENDED_RELEASE_TABLET | ORAL | 5 refills | Status: DC

## 2020-03-19 NOTE — Telephone Encounter (Signed)
Called and left a detailed message with patients caregiver Fraser Din with the below copied and pasted result note:  Agree with recommendations from [employer] PA. Moving forward, increase KCl to 40 mEq in the a.m., 20 mEq in the p.m (60 mEq daily total). Repeat BMP in 1 week  Lab order entered and prescription updated.

## 2020-03-24 ENCOUNTER — Telehealth: Payer: Self-pay | Admitting: Family Medicine

## 2020-03-24 DIAGNOSIS — R2681 Unsteadiness on feet: Secondary | ICD-10-CM | POA: Diagnosis not present

## 2020-03-24 DIAGNOSIS — I110 Hypertensive heart disease with heart failure: Secondary | ICD-10-CM | POA: Diagnosis not present

## 2020-03-24 DIAGNOSIS — Z7901 Long term (current) use of anticoagulants: Secondary | ICD-10-CM | POA: Diagnosis not present

## 2020-03-24 DIAGNOSIS — I4891 Unspecified atrial fibrillation: Secondary | ICD-10-CM | POA: Diagnosis not present

## 2020-03-24 DIAGNOSIS — R531 Weakness: Secondary | ICD-10-CM | POA: Diagnosis not present

## 2020-03-24 DIAGNOSIS — Z950 Presence of cardiac pacemaker: Secondary | ICD-10-CM | POA: Diagnosis not present

## 2020-03-24 DIAGNOSIS — E261 Secondary hyperaldosteronism: Secondary | ICD-10-CM | POA: Diagnosis not present

## 2020-03-24 DIAGNOSIS — I5030 Unspecified diastolic (congestive) heart failure: Secondary | ICD-10-CM | POA: Diagnosis not present

## 2020-03-24 DIAGNOSIS — I739 Peripheral vascular disease, unspecified: Secondary | ICD-10-CM | POA: Diagnosis not present

## 2020-03-24 NOTE — Telephone Encounter (Signed)
Brittini with [facility] called to report a fall of the patient on 03/23/20.  She stated that the patient said there were no injuries and he was not in pain.  Please advise and call with any questions at [phone]

## 2020-03-25 ENCOUNTER — Other Ambulatory Visit: Payer: Self-pay | Admitting: *Deleted

## 2020-03-25 DIAGNOSIS — Z7901 Long term (current) use of anticoagulants: Secondary | ICD-10-CM | POA: Diagnosis not present

## 2020-03-25 DIAGNOSIS — I4891 Unspecified atrial fibrillation: Secondary | ICD-10-CM | POA: Diagnosis not present

## 2020-03-25 DIAGNOSIS — I5030 Unspecified diastolic (congestive) heart failure: Secondary | ICD-10-CM | POA: Diagnosis not present

## 2020-03-25 DIAGNOSIS — Z950 Presence of cardiac pacemaker: Secondary | ICD-10-CM | POA: Diagnosis not present

## 2020-03-25 DIAGNOSIS — R531 Weakness: Secondary | ICD-10-CM | POA: Diagnosis not present

## 2020-03-25 DIAGNOSIS — R2681 Unsteadiness on feet: Secondary | ICD-10-CM | POA: Diagnosis not present

## 2020-03-25 DIAGNOSIS — E261 Secondary hyperaldosteronism: Secondary | ICD-10-CM | POA: Diagnosis not present

## 2020-03-25 DIAGNOSIS — I110 Hypertensive heart disease with heart failure: Secondary | ICD-10-CM | POA: Diagnosis not present

## 2020-03-25 DIAGNOSIS — I739 Peripheral vascular disease, unspecified: Secondary | ICD-10-CM | POA: Diagnosis not present

## 2020-03-26 DIAGNOSIS — I5030 Unspecified diastolic (congestive) heart failure: Secondary | ICD-10-CM | POA: Diagnosis not present

## 2020-03-26 DIAGNOSIS — Z950 Presence of cardiac pacemaker: Secondary | ICD-10-CM | POA: Diagnosis not present

## 2020-03-26 DIAGNOSIS — I110 Hypertensive heart disease with heart failure: Secondary | ICD-10-CM | POA: Diagnosis not present

## 2020-03-26 DIAGNOSIS — R2681 Unsteadiness on feet: Secondary | ICD-10-CM | POA: Diagnosis not present

## 2020-03-26 DIAGNOSIS — R531 Weakness: Secondary | ICD-10-CM | POA: Diagnosis not present

## 2020-03-26 DIAGNOSIS — I4891 Unspecified atrial fibrillation: Secondary | ICD-10-CM | POA: Diagnosis not present

## 2020-03-26 DIAGNOSIS — E261 Secondary hyperaldosteronism: Secondary | ICD-10-CM | POA: Diagnosis not present

## 2020-03-26 DIAGNOSIS — Z7901 Long term (current) use of anticoagulants: Secondary | ICD-10-CM | POA: Diagnosis not present

## 2020-03-26 DIAGNOSIS — I739 Peripheral vascular disease, unspecified: Secondary | ICD-10-CM | POA: Diagnosis not present

## 2020-03-27 ENCOUNTER — Other Ambulatory Visit
Admission: RE | Admit: 2020-03-27 | Discharge: 2020-03-27 | Disposition: A | Discharge status: Home / Self Care | Payer: Medicare Other | Source: Ambulatory Visit | Attending: Cardiology | Admitting: Cardiology

## 2020-03-27 ENCOUNTER — Telehealth: Payer: Self-pay | Admitting: Cardiology

## 2020-03-27 DIAGNOSIS — I5022 Chronic systolic (congestive) heart failure: Secondary | ICD-10-CM

## 2020-03-27 LAB — BASIC METABOLIC PANEL
Anion gap: 18 — ABNORMAL HIGH (ref 5–15)
BUN: 72 mg/dL — ABNORMAL HIGH (ref 8–23)
CO2: 35 mmol/L — ABNORMAL HIGH (ref 22–32)
Calcium: 9.3 mg/dL (ref 8.9–10.3)
Chloride: 70 mmol/L — ABNORMAL LOW (ref 98–111)
Creatinine, Ser: 1.46 mg/dL — ABNORMAL HIGH (ref 0.61–1.24)
GFR, Estimated: 43 mL/min — ABNORMAL LOW (ref >60)
Glucose, Bld: 149 mg/dL — ABNORMAL HIGH (ref 70–99)
Potassium: 2.6 mmol/L — CL (ref 3.5–5.1)
Sodium: 123 mmol/L — ABNORMAL LOW (ref 135–145)

## 2020-03-27 MED ORDER — POTASSIUM CHLORIDE ER 20 MEQ PO TBCR
EXTENDED_RELEASE_TABLET | ORAL | 5 refills | Status: DC
Start: 2020-03-27 — End: 2020-04-11

## 2020-03-27 NOTE — Telephone Encounter (Signed)
Spoke with Dr. Garen Lah  and he instructed to increase the Potassium to 40 MEQ twice daily, and to get a repeat BMP drawn next week. I Maureen Chatters, patients caregiver and gave her the recommendations and she verbalized understanding and agreed with plan.

## 2020-03-27 NOTE — Telephone Encounter (Signed)
Lab called to report a critical Potassium of 2.6 on patients lab draw today. Will route to Dr. Garen Lah now.

## 2020-03-27 NOTE — Telephone Encounter (Signed)
Lab calling with a critical result Transferred to [facility]

## 2020-03-28 DIAGNOSIS — R531 Weakness: Secondary | ICD-10-CM | POA: Diagnosis not present

## 2020-03-28 DIAGNOSIS — I4891 Unspecified atrial fibrillation: Secondary | ICD-10-CM | POA: Diagnosis not present

## 2020-03-28 DIAGNOSIS — Z950 Presence of cardiac pacemaker: Secondary | ICD-10-CM | POA: Diagnosis not present

## 2020-03-28 DIAGNOSIS — I739 Peripheral vascular disease, unspecified: Secondary | ICD-10-CM | POA: Diagnosis not present

## 2020-03-28 DIAGNOSIS — I5030 Unspecified diastolic (congestive) heart failure: Secondary | ICD-10-CM | POA: Diagnosis not present

## 2020-03-28 DIAGNOSIS — I110 Hypertensive heart disease with heart failure: Secondary | ICD-10-CM | POA: Diagnosis not present

## 2020-03-28 DIAGNOSIS — E261 Secondary hyperaldosteronism: Secondary | ICD-10-CM | POA: Diagnosis not present

## 2020-03-28 DIAGNOSIS — Z7901 Long term (current) use of anticoagulants: Secondary | ICD-10-CM | POA: Diagnosis not present

## 2020-03-28 DIAGNOSIS — R2681 Unsteadiness on feet: Secondary | ICD-10-CM | POA: Diagnosis not present

## 2020-04-03 ENCOUNTER — Ambulatory Visit: Payer: Medicare Other | Admitting: Cardiology

## 2020-04-03 ENCOUNTER — Other Ambulatory Visit
Admission: RE | Admit: 2020-04-03 | Discharge: 2020-04-03 | Disposition: A | Discharge status: Home / Self Care | Payer: Medicare Other | Source: Ambulatory Visit | Attending: Cardiology | Admitting: Cardiology

## 2020-04-03 ENCOUNTER — Other Ambulatory Visit: Payer: Self-pay | Admitting: Family Medicine

## 2020-04-03 DIAGNOSIS — E876 Hypokalemia: Secondary | ICD-10-CM | POA: Diagnosis not present

## 2020-04-03 DIAGNOSIS — R2681 Unsteadiness on feet: Secondary | ICD-10-CM | POA: Diagnosis not present

## 2020-04-03 DIAGNOSIS — I429 Cardiomyopathy, unspecified: Secondary | ICD-10-CM | POA: Diagnosis not present

## 2020-04-03 DIAGNOSIS — J9811 Atelectasis: Secondary | ICD-10-CM | POA: Diagnosis not present

## 2020-04-03 DIAGNOSIS — I5022 Chronic systolic (congestive) heart failure: Secondary | ICD-10-CM | POA: Insufficient documentation

## 2020-04-03 DIAGNOSIS — I495 Sick sinus syndrome: Secondary | ICD-10-CM | POA: Diagnosis not present

## 2020-04-03 DIAGNOSIS — R531 Weakness: Secondary | ICD-10-CM | POA: Diagnosis not present

## 2020-04-03 DIAGNOSIS — E038 Other specified hypothyroidism: Secondary | ICD-10-CM | POA: Diagnosis not present

## 2020-04-03 DIAGNOSIS — I4510 Unspecified right bundle-branch block: Secondary | ICD-10-CM | POA: Diagnosis not present

## 2020-04-03 DIAGNOSIS — I6782 Cerebral ischemia: Secondary | ICD-10-CM | POA: Diagnosis not present

## 2020-04-03 DIAGNOSIS — I4821 Permanent atrial fibrillation: Secondary | ICD-10-CM | POA: Diagnosis not present

## 2020-04-03 DIAGNOSIS — I4891 Unspecified atrial fibrillation: Secondary | ICD-10-CM | POA: Diagnosis not present

## 2020-04-03 DIAGNOSIS — Z950 Presence of cardiac pacemaker: Secondary | ICD-10-CM | POA: Diagnosis not present

## 2020-04-03 DIAGNOSIS — E871 Hypo-osmolality and hyponatremia: Secondary | ICD-10-CM | POA: Diagnosis not present

## 2020-04-03 DIAGNOSIS — Z823 Family history of stroke: Secondary | ICD-10-CM | POA: Diagnosis not present

## 2020-04-03 DIAGNOSIS — R0602 Shortness of breath: Secondary | ICD-10-CM | POA: Diagnosis not present

## 2020-04-03 DIAGNOSIS — E261 Secondary hyperaldosteronism: Secondary | ICD-10-CM | POA: Diagnosis not present

## 2020-04-03 DIAGNOSIS — Z923 Personal history of irradiation: Secondary | ICD-10-CM | POA: Diagnosis not present

## 2020-04-03 DIAGNOSIS — Z79899 Other long term (current) drug therapy: Secondary | ICD-10-CM | POA: Diagnosis not present

## 2020-04-03 DIAGNOSIS — J90 Pleural effusion, not elsewhere classified: Secondary | ICD-10-CM | POA: Diagnosis not present

## 2020-04-03 DIAGNOSIS — H9190 Unspecified hearing loss, unspecified ear: Secondary | ICD-10-CM | POA: Diagnosis not present

## 2020-04-03 DIAGNOSIS — I110 Hypertensive heart disease with heart failure: Secondary | ICD-10-CM | POA: Diagnosis not present

## 2020-04-03 DIAGNOSIS — I5030 Unspecified diastolic (congestive) heart failure: Secondary | ICD-10-CM | POA: Diagnosis not present

## 2020-04-03 DIAGNOSIS — Z66 Do not resuscitate: Secondary | ICD-10-CM | POA: Diagnosis not present

## 2020-04-03 DIAGNOSIS — R296 Repeated falls: Secondary | ICD-10-CM | POA: Diagnosis not present

## 2020-04-03 DIAGNOSIS — G9341 Metabolic encephalopathy: Secondary | ICD-10-CM | POA: Diagnosis not present

## 2020-04-03 DIAGNOSIS — G319 Degenerative disease of nervous system, unspecified: Secondary | ICD-10-CM | POA: Diagnosis not present

## 2020-04-03 DIAGNOSIS — I739 Peripheral vascular disease, unspecified: Secondary | ICD-10-CM | POA: Diagnosis not present

## 2020-04-03 DIAGNOSIS — T502X5A Adverse effect of carbonic-anhydrase inhibitors, benzothiadiazides and other diuretics, initial encounter: Secondary | ICD-10-CM | POA: Diagnosis not present

## 2020-04-03 DIAGNOSIS — N390 Urinary tract infection, site not specified: Secondary | ICD-10-CM | POA: Diagnosis not present

## 2020-04-03 DIAGNOSIS — Z7901 Long term (current) use of anticoagulants: Secondary | ICD-10-CM | POA: Diagnosis not present

## 2020-04-03 DIAGNOSIS — Z8249 Family history of ischemic heart disease and other diseases of the circulatory system: Secondary | ICD-10-CM | POA: Diagnosis not present

## 2020-04-03 LAB — BASIC METABOLIC PANEL
Anion gap: 14 (ref 5–15)
BUN: 61 mg/dL — ABNORMAL HIGH (ref 8–23)
CO2: 37 mmol/L — ABNORMAL HIGH (ref 22–32)
Calcium: 9.8 mg/dL (ref 8.9–10.3)
Chloride: 71 mmol/L — ABNORMAL LOW (ref 98–111)
Creatinine, Ser: 1.53 mg/dL — ABNORMAL HIGH (ref 0.61–1.24)
GFR, Estimated: 41 mL/min — ABNORMAL LOW (ref >60)
Glucose, Bld: 125 mg/dL — ABNORMAL HIGH (ref 70–99)
Potassium: 2.8 mmol/L — ABNORMAL LOW (ref 3.5–5.1)
Sodium: 122 mmol/L — ABNORMAL LOW (ref 135–145)

## 2020-04-03 NOTE — Telephone Encounter (Signed)
Requested medication (s) are due for refill today:   Not sure  Requested medication (s) are on the active medication list:   Yes  Future visit scheduled:   No.   Seen 2 wks ago   Last ordered: 05/24/2019 by a historical provider.     Requested Prescriptions  Pending Prescriptions Disp Refills   amitriptyline (ELAVIL) 25 MG tablet [Pharmacy Med Name: AMITRIPTYLINE HCL 25 MG TAB] 30 tablet     Sig: TAKE ONE TABLET (25 MG) BY MOUTH AT BEDTIME      Psychiatry:  Antidepressants - Heterocyclics (TCAs) Passed - 04/03/2020  2:39 PM      Passed - Valid encounter within last 6 months    Recent Outpatient Visits           2 weeks ago Community acquired pneumonia, unspecified laterality   [facility] Birdie Sons, MD   10 months ago Edema, unspecified type   [facility] Birdie Sons, MD   1 year ago Acute on chronic congestive heart failure, unspecified heart failure type [facility])   [facility] Birdie Sons, MD   1 year ago Olecranon bursitis of left elbow   [facility] Birdie Sons, MD   1 year ago Acute cystitis without hematuria   [place], Clearnce Sorrel, [place]       Future Appointments             In 1 month Agbor-Etang, Aaron Edelman, MD [facility], [place]

## 2020-04-03 NOTE — Telephone Encounter (Signed)
Please advise on refill request. This medication is listed as historical.

## 2020-04-04 ENCOUNTER — Ambulatory Visit: Payer: Medicare Other | Admitting: Cardiology

## 2020-04-04 ENCOUNTER — Telehealth: Payer: Self-pay | Admitting: *Deleted

## 2020-04-04 DIAGNOSIS — E261 Secondary hyperaldosteronism: Secondary | ICD-10-CM | POA: Diagnosis not present

## 2020-04-04 DIAGNOSIS — Z7901 Long term (current) use of anticoagulants: Secondary | ICD-10-CM | POA: Diagnosis not present

## 2020-04-04 DIAGNOSIS — R2681 Unsteadiness on feet: Secondary | ICD-10-CM | POA: Diagnosis not present

## 2020-04-04 DIAGNOSIS — I4891 Unspecified atrial fibrillation: Secondary | ICD-10-CM | POA: Diagnosis not present

## 2020-04-04 DIAGNOSIS — I5022 Chronic systolic (congestive) heart failure: Secondary | ICD-10-CM

## 2020-04-04 DIAGNOSIS — R531 Weakness: Secondary | ICD-10-CM | POA: Diagnosis not present

## 2020-04-04 DIAGNOSIS — I110 Hypertensive heart disease with heart failure: Secondary | ICD-10-CM | POA: Diagnosis not present

## 2020-04-04 DIAGNOSIS — I5030 Unspecified diastolic (congestive) heart failure: Secondary | ICD-10-CM | POA: Diagnosis not present

## 2020-04-04 DIAGNOSIS — Z950 Presence of cardiac pacemaker: Secondary | ICD-10-CM | POA: Diagnosis not present

## 2020-04-04 DIAGNOSIS — E876 Hypokalemia: Secondary | ICD-10-CM

## 2020-04-04 DIAGNOSIS — I739 Peripheral vascular disease, unspecified: Secondary | ICD-10-CM | POA: Diagnosis not present

## 2020-04-04 MED ORDER — TORSEMIDE 20 MG PO TABS
ORAL_TABLET | ORAL | 3 refills | Status: DC
Start: 2020-04-04 — End: 2020-04-11

## 2020-04-04 NOTE — Telephone Encounter (Signed)
It looks like this is prescribed by Caroline More. He needs to contact the prescriber for refills.

## 2020-04-04 NOTE — Telephone Encounter (Signed)
Home number said - went straight to message saying VM not set up at this time and cannot accept messages.  Mobile number said - call cannot be completed at this time.

## 2020-04-04 NOTE — Telephone Encounter (Signed)
-----   Message from Kate Sable, MD sent at 04/03/2020  6:12 PM EST ----- Potassium level is improved slightly compared to prior to..  Continue potassium as currently taking, 40 mEq twice daily, recheck potassium levels in 2 weeks.

## 2020-04-04 NOTE — Telephone Encounter (Signed)
I have reviewed the message below with Dr. Garen Lah. After further discussion with MD, recommendations were received to:  1) decrease torsemide 20 mg- take 1 tablet daily 2) continue metolazone 2.5 mg- take 1 tablet daily  3) continue potassium 20 meq- take 2 tablets (40 meq) BID 4) repeat a BMP in 5 days 5) follow up in office in 1 week (after the BMP results)  Per Dr. Garen Lah, he feels low potassium related to 2 diuretics. The patient also has known kidney disease.   We will call Ms. Hissong back after 12 pm today with MD recommendations.   If MD schedule is booked in 1 week, will try to schedule with an APP on a day Dr. Garen Lah is in the office.

## 2020-04-04 NOTE — Telephone Encounter (Signed)
I called and spoke with [facility], the patient's caregiver (ok per DPR).   I have advised her of the patient's BMP results from yesterday and Dr. Thereasa Solo recommendations to continue potassium 40 meq BID.   Per Ms. Hissong, the patient is currently living at [facility].  She states that he has not been feeling well since his potassium has been running low for the last couple of weeks.  03/18/20- 2.6 03/27/20- 2.6 04/03/20- 2.8  She does confirm the patient is currently taking potassium 40 meq BID. He is having extreme weakness. He fell yesterday as his legs "just gave out." She reports he has finally consented to move to [facility] if they will accept him for a greater level of care. Ms. Willeen Niece advised this is not common for the patient as he has been against this.  She also advised that the patient commented that "he feels he is on his way out of here."  Ms. Hissong is worried the patient is just not absorbing these high doses of potassium.  I have advised her I will need to review this in more detail with Dr. Garen Lah and call her back.  She states she is in a doctor's appointment now and asked that we call back after 12 pm.

## 2020-04-04 NOTE — Telephone Encounter (Signed)
I called and spoke with Ms. Hissong. I have advised her of Dr. Thereasa Solo recommendations as stated below.  She voices understanding of these recommendations. She is agreeable with the lab work and the follow up visit.  She states it would be easier on the patient to have these the same day.  I have advised her we can see the patient on Thursday 04/10/20 with Ignacia Bayley, NP at 2:45 pm. She is agreeable with bringing the patient to the [facility] at 1:30 pm for a STAT BMP to be done.   She is aware I will notify Dr. Garen Lah when this results so he can review prior to the patient's appointment later that day.   She has also been advised if the patient feels any worse, to please call the office or take him to the ER (she prefers [place]).

## 2020-04-06 ENCOUNTER — Emergency Department: Payer: Medicare Other

## 2020-04-06 ENCOUNTER — Other Ambulatory Visit: Payer: Self-pay

## 2020-04-06 ENCOUNTER — Inpatient Hospital Stay
Admission: EM | Admit: 2020-04-06 | Discharge: 2020-04-11 | DRG: 640 | Disposition: A | Discharge status: Home Health | Payer: Medicare Other | Attending: Internal Medicine | Admitting: Internal Medicine

## 2020-04-06 DIAGNOSIS — N390 Urinary tract infection, site not specified: Secondary | ICD-10-CM | POA: Diagnosis present

## 2020-04-06 DIAGNOSIS — I495 Sick sinus syndrome: Secondary | ICD-10-CM | POA: Diagnosis present

## 2020-04-06 DIAGNOSIS — E038 Other specified hypothyroidism: Secondary | ICD-10-CM | POA: Diagnosis present

## 2020-04-06 DIAGNOSIS — T502X5A Adverse effect of carbonic-anhydrase inhibitors, benzothiadiazides and other diuretics, initial encounter: Secondary | ICD-10-CM | POA: Diagnosis present

## 2020-04-06 DIAGNOSIS — I4821 Permanent atrial fibrillation: Secondary | ICD-10-CM | POA: Diagnosis present

## 2020-04-06 DIAGNOSIS — Z823 Family history of stroke: Secondary | ICD-10-CM | POA: Diagnosis not present

## 2020-04-06 DIAGNOSIS — J90 Pleural effusion, not elsewhere classified: Secondary | ICD-10-CM | POA: Diagnosis not present

## 2020-04-06 DIAGNOSIS — I4510 Unspecified right bundle-branch block: Secondary | ICD-10-CM | POA: Diagnosis present

## 2020-04-06 DIAGNOSIS — R531 Weakness: Secondary | ICD-10-CM | POA: Diagnosis not present

## 2020-04-06 DIAGNOSIS — J9811 Atelectasis: Secondary | ICD-10-CM | POA: Diagnosis not present

## 2020-04-06 DIAGNOSIS — R296 Repeated falls: Secondary | ICD-10-CM | POA: Diagnosis present

## 2020-04-06 DIAGNOSIS — Z8546 Personal history of malignant neoplasm of prostate: Secondary | ICD-10-CM

## 2020-04-06 DIAGNOSIS — Z79899 Other long term (current) drug therapy: Secondary | ICD-10-CM

## 2020-04-06 DIAGNOSIS — H9190 Unspecified hearing loss, unspecified ear: Secondary | ICD-10-CM | POA: Diagnosis present

## 2020-04-06 DIAGNOSIS — E876 Hypokalemia: Secondary | ICD-10-CM | POA: Diagnosis not present

## 2020-04-06 DIAGNOSIS — Z923 Personal history of irradiation: Secondary | ICD-10-CM | POA: Diagnosis not present

## 2020-04-06 DIAGNOSIS — Z8249 Family history of ischemic heart disease and other diseases of the circulatory system: Secondary | ICD-10-CM | POA: Diagnosis not present

## 2020-04-06 DIAGNOSIS — R0602 Shortness of breath: Secondary | ICD-10-CM | POA: Diagnosis not present

## 2020-04-06 DIAGNOSIS — Z66 Do not resuscitate: Secondary | ICD-10-CM | POA: Diagnosis present

## 2020-04-06 DIAGNOSIS — E871 Hypo-osmolality and hyponatremia: Secondary | ICD-10-CM | POA: Diagnosis not present

## 2020-04-06 DIAGNOSIS — R0989 Other specified symptoms and signs involving the circulatory and respiratory systems: Secondary | ICD-10-CM | POA: Diagnosis not present

## 2020-04-06 DIAGNOSIS — Z515 Encounter for palliative care: Secondary | ICD-10-CM | POA: Diagnosis not present

## 2020-04-06 DIAGNOSIS — Z20822 Contact with and (suspected) exposure to covid-19: Secondary | ICD-10-CM | POA: Diagnosis present

## 2020-04-06 DIAGNOSIS — G9341 Metabolic encephalopathy: Secondary | ICD-10-CM | POA: Diagnosis present

## 2020-04-06 DIAGNOSIS — Z950 Presence of cardiac pacemaker: Secondary | ICD-10-CM

## 2020-04-06 DIAGNOSIS — N400 Enlarged prostate without lower urinary tract symptoms: Secondary | ICD-10-CM | POA: Diagnosis present

## 2020-04-06 DIAGNOSIS — Z743 Need for continuous supervision: Secondary | ICD-10-CM | POA: Diagnosis not present

## 2020-04-06 DIAGNOSIS — I6523 Occlusion and stenosis of bilateral carotid arteries: Secondary | ICD-10-CM | POA: Diagnosis not present

## 2020-04-06 DIAGNOSIS — R6889 Other general symptoms and signs: Secondary | ICD-10-CM | POA: Diagnosis not present

## 2020-04-06 DIAGNOSIS — I5022 Chronic systolic (congestive) heart failure: Secondary | ICD-10-CM | POA: Diagnosis not present

## 2020-04-06 DIAGNOSIS — I429 Cardiomyopathy, unspecified: Secondary | ICD-10-CM | POA: Diagnosis present

## 2020-04-06 DIAGNOSIS — R5381 Other malaise: Secondary | ICD-10-CM | POA: Diagnosis not present

## 2020-04-06 DIAGNOSIS — R0902 Hypoxemia: Secondary | ICD-10-CM | POA: Diagnosis not present

## 2020-04-06 DIAGNOSIS — I480 Paroxysmal atrial fibrillation: Secondary | ICD-10-CM | POA: Diagnosis not present

## 2020-04-06 DIAGNOSIS — I6782 Cerebral ischemia: Secondary | ICD-10-CM | POA: Diagnosis not present

## 2020-04-06 DIAGNOSIS — Z7189 Other specified counseling: Secondary | ICD-10-CM | POA: Diagnosis not present

## 2020-04-06 DIAGNOSIS — I499 Cardiac arrhythmia, unspecified: Secondary | ICD-10-CM | POA: Diagnosis not present

## 2020-04-06 DIAGNOSIS — G319 Degenerative disease of nervous system, unspecified: Secondary | ICD-10-CM | POA: Diagnosis not present

## 2020-04-06 LAB — BASIC METABOLIC PANEL
Anion gap: 18 — ABNORMAL HIGH (ref 5–15)
BUN: 64 mg/dL — ABNORMAL HIGH (ref 8–23)
CO2: 34 mmol/L — ABNORMAL HIGH (ref 22–32)
Calcium: 9.4 mg/dL (ref 8.9–10.3)
Chloride: 70 mmol/L — ABNORMAL LOW (ref 98–111)
Creatinine, Ser: 1.25 mg/dL — ABNORMAL HIGH (ref 0.61–1.24)
GFR, Estimated: 52 mL/min — ABNORMAL LOW (ref >60)
Glucose, Bld: 124 mg/dL — ABNORMAL HIGH (ref 70–99)
Potassium: <2 mmol/L — CL (ref 3.5–5.1)
Sodium: 122 mmol/L — ABNORMAL LOW (ref 135–145)

## 2020-04-06 LAB — URINALYSIS, COMPLETE (UACMP) WITH MICROSCOPIC
Bilirubin Urine: NEGATIVE
Glucose, UA: NEGATIVE mg/dL
Ketones, ur: NEGATIVE mg/dL
Nitrite: NEGATIVE
Protein, ur: 30 mg/dL — AB
Specific Gravity, Urine: 1.009 (ref 1.005–1.030)
Squamous Epithelial / HPF: NONE SEEN (ref 0–5)
WBC, UA: >50 WBC/hpf — ABNORMAL HIGH (ref 0–5)
pH: 7 (ref 5.0–8.0)

## 2020-04-06 LAB — CBC
HCT: 38.8 % — ABNORMAL LOW (ref 39.0–52.0)
Hemoglobin: 14.1 g/dL (ref 13.0–17.0)
MCH: 35.4 pg — ABNORMAL HIGH (ref 26.0–34.0)
MCHC: 36.3 g/dL — ABNORMAL HIGH (ref 30.0–36.0)
MCV: 97.5 fL (ref 80.0–100.0)
Platelets: 233 10*3/uL (ref 150–400)
RBC: 3.98 MIL/uL — ABNORMAL LOW (ref 4.22–5.81)
RDW: 12.2 % (ref 11.5–15.5)
WBC: 9.2 10*3/uL (ref 4.0–10.5)
nRBC: 0 % (ref 0.0–0.2)

## 2020-04-06 LAB — RESP PANEL BY RT-PCR (FLU A&B, COVID) ARPGX2
Influenza A by PCR: NEGATIVE
Influenza B by PCR: NEGATIVE
SARS Coronavirus 2 by RT PCR: NEGATIVE

## 2020-04-06 LAB — BRAIN NATRIURETIC PEPTIDE: B Natriuretic Peptide: 108.7 pg/mL — ABNORMAL HIGH (ref 0.0–100.0)

## 2020-04-06 LAB — CK: Total CK: 83 U/L (ref 49–397)

## 2020-04-06 LAB — MAGNESIUM: Magnesium: 2.5 mg/dL — ABNORMAL HIGH (ref 1.7–2.4)

## 2020-04-06 LAB — TSH: TSH: 7.167 u[IU]/mL — ABNORMAL HIGH (ref 0.350–4.500)

## 2020-04-06 MED ORDER — ACETAMINOPHEN 325 MG PO TABS: 650.0000 mg | ORAL_TABLET | Freq: Four times a day (QID) | ORAL | Status: DC | PRN

## 2020-04-06 MED ORDER — POLYETHYLENE GLYCOL 3350 17 GM/SCOOP PO POWD: 1.0000 | Freq: Every day | ORAL | Status: DC

## 2020-04-06 MED ORDER — GABAPENTIN 300 MG PO CAPS: 300.0000 mg | ORAL_CAPSULE | Freq: Two times a day (BID) | ORAL | Status: DC

## 2020-04-06 MED ORDER — VITAMIN E 45 MG (100 UNIT) PO CAPS
400.0000 [IU] | ORAL_CAPSULE | Freq: Every day | ORAL | Status: DC
  Administered 2020-04-09 – 2020-04-11 (×3): 400 [IU] via ORAL
  Filled 2020-04-06 (×5): qty 4

## 2020-04-06 MED ORDER — POLYETHYLENE GLYCOL 3350 17 G PO PACK
17.0000 g | PACK | Freq: Every day | ORAL | Status: DC
  Administered 2020-04-08 – 2020-04-11 (×4): 17 g via ORAL
  Filled 2020-04-06 (×4): qty 1

## 2020-04-06 MED ORDER — SODIUM CHLORIDE 0.9 % IV SOLN: 250.0000 mL | INTRAVENOUS | Status: DC | PRN

## 2020-04-06 MED ORDER — ENOXAPARIN SODIUM 30 MG/0.3ML ~~LOC~~ SOLN
30.0000 mg | SUBCUTANEOUS | Status: DC
  Administered 2020-04-06: 30 mg via SUBCUTANEOUS
  Filled 2020-04-06 (×2): qty 0.3

## 2020-04-06 MED ORDER — CALCIUM CARBONATE-VITAMIN D 500-200 MG-UNIT PO TABS
1.0000 | ORAL_TABLET | Freq: Every day | ORAL | Status: DC
  Administered 2020-04-06 – 2020-04-11 (×5): 1 via ORAL
  Filled 2020-04-06 (×6): qty 1

## 2020-04-06 MED ORDER — GABAPENTIN 300 MG PO CAPS
300.0000 mg | ORAL_CAPSULE | Freq: Every day | ORAL | Status: DC
  Administered 2020-04-07 – 2020-04-11 (×5): 300 mg via ORAL
  Filled 2020-04-06 (×5): qty 1

## 2020-04-06 MED ORDER — VITAMIN D 25 MCG (1000 UNIT) PO TABS
2000.0000 [IU] | ORAL_TABLET | Freq: Every day | ORAL | Status: DC
  Administered 2020-04-08 – 2020-04-11 (×4): 2000 [IU] via ORAL
  Filled 2020-04-06 (×4): qty 2

## 2020-04-06 MED ORDER — TAMSULOSIN HCL 0.4 MG PO CAPS
0.4000 mg | ORAL_CAPSULE | Freq: Every day | ORAL | Status: DC
  Administered 2020-04-07 – 2020-04-11 (×5): 0.4 mg via ORAL
  Filled 2020-04-06 (×5): qty 1

## 2020-04-06 MED ORDER — ONDANSETRON HCL 4 MG PO TABS: 4.0000 mg | ORAL_TABLET | Freq: Four times a day (QID) | ORAL | Status: DC | PRN

## 2020-04-06 MED ORDER — ONDANSETRON HCL 4 MG/2ML IJ SOLN: 4.0000 mg | Freq: Four times a day (QID) | INTRAMUSCULAR | Status: DC | PRN

## 2020-04-06 MED ORDER — POTASSIUM CHLORIDE 10 MEQ/100ML IV SOLN
10.0000 meq | Freq: Once | INTRAVENOUS | Status: DC
  Filled 2020-04-06: qty 100

## 2020-04-06 MED ORDER — SODIUM CHLORIDE 0.9% FLUSH
3.0000 mL | Freq: Two times a day (BID) | INTRAVENOUS | Status: DC
  Administered 2020-04-06 – 2020-04-10 (×6): 3 mL via INTRAVENOUS

## 2020-04-06 MED ORDER — ADULT MULTIVITAMIN W/MINERALS CH
1.0000 | ORAL_TABLET | Freq: Every day | ORAL | Status: DC
  Administered 2020-04-07 – 2020-04-11 (×5): 1 via ORAL
  Filled 2020-04-06 (×5): qty 1

## 2020-04-06 MED ORDER — POTASSIUM CHLORIDE CRYS ER 20 MEQ PO TBCR
40.0000 meq | EXTENDED_RELEASE_TABLET | Freq: Three times a day (TID) | ORAL | Status: AC
  Administered 2020-04-06 – 2020-04-07 (×3): 40 meq via ORAL
  Filled 2020-04-06 (×2): qty 2

## 2020-04-06 MED ORDER — ACETAMINOPHEN 650 MG RE SUPP: 650.0000 mg | Freq: Four times a day (QID) | RECTAL | Status: DC | PRN

## 2020-04-06 MED ORDER — SODIUM CHLORIDE 1 G PO TABS
1.0000 g | ORAL_TABLET | Freq: Two times a day (BID) | ORAL | Status: DC
  Administered 2020-04-06 – 2020-04-11 (×8): 1 g via ORAL
  Filled 2020-04-06 (×11): qty 1

## 2020-04-06 MED ORDER — ASCORBIC ACID 500 MG PO TABS
1000.0000 mg | ORAL_TABLET | Freq: Every day | ORAL | Status: DC
Start: 2020-04-06 — End: 2020-04-11
  Administered 2020-04-06 – 2020-04-11 (×6): 1000 mg via ORAL
  Filled 2020-04-06 (×6): qty 2

## 2020-04-06 MED ORDER — GABAPENTIN 300 MG PO CAPS
600.0000 mg | ORAL_CAPSULE | Freq: Every day | ORAL | Status: DC
  Administered 2020-04-06 – 2020-04-10 (×5): 600 mg via ORAL
  Filled 2020-04-06 (×5): qty 2

## 2020-04-06 MED ORDER — SODIUM CHLORIDE 0.9 % IV BOLUS: 250.0000 mL | Freq: Once | INTRAVENOUS | Status: DC

## 2020-04-06 MED ORDER — SODIUM CHLORIDE 0.9% FLUSH: 3.0000 mL | INTRAVENOUS | Status: DC | PRN

## 2020-04-06 MED ORDER — FERROUS SULFATE 325 (65 FE) MG PO TABS
325.0000 mg | ORAL_TABLET | Freq: Every day | ORAL | Status: DC
  Administered 2020-04-08 – 2020-04-11 (×4): 325 mg via ORAL
  Filled 2020-04-06 (×6): qty 1

## 2020-04-06 MED ORDER — POTASSIUM CHLORIDE 20 MEQ PO PACK
40.0000 meq | PACK | ORAL | Status: AC
  Administered 2020-04-06: 40 meq via ORAL
  Filled 2020-04-06: qty 2

## 2020-04-06 MED ORDER — AMITRIPTYLINE HCL 25 MG PO TABS
25.0000 mg | ORAL_TABLET | Freq: Every day | ORAL | Status: DC
  Administered 2020-04-06 – 2020-04-10 (×5): 25 mg via ORAL
  Filled 2020-04-06 (×5): qty 1

## 2020-04-06 MED ORDER — VITAMIN B-6 50 MG PO TABS
100.0000 mg | ORAL_TABLET | Freq: Every day | ORAL | Status: DC
  Administered 2020-04-09 – 2020-04-11 (×3): 100 mg via ORAL
  Filled 2020-04-06 (×5): qty 2

## 2020-04-06 NOTE — ED Notes (Signed)
Pt daughter in law at bedside visiting with pt

## 2020-04-06 NOTE — ED Notes (Signed)
Patient transported to CT 

## 2020-04-06 NOTE — ED Notes (Signed)
Pharmacy contacted to send pt medications to ed

## 2020-04-06 NOTE — BH Assessment (Addendum)
Assessment Note  Douglas Calhoun is an 86 y.o. male. who presents to [facility] ED voluntarily for treatment. Per triage note, Pt arrived via ems from home. EMS reports they were contacted by family for pt being more confused than normal. Family reported to ems that he stopped taking his medications on Friday because he no longer wishes to take them and would like to pass away. Pt alert and oriented x4 on arrival. Pt states he was "hoping the good lord would take me in my sleep". Pt expresses wishes to stop taking his medications. Denies any thoughts of physically harming himself or anyone else. NAD noted.  During TTS assessment pt presents alert, pleasant, cooperative, oriented x 3, and mood-congruent with affect. The pt does not appear to be responding to internal or external stimuli. Neither is the pt presenting with any delusional thinking. Pt verified the information provided to triage RN. Pt identifies his main complaint to be the need for more assistance in caring for himself. Pt admits to being more confused than normal and to endorse "a little" SI in the last 3 days. Pt reports increased thoughts of wishing the lord would take him to heaven but then stated, But my grandson is getting me a place at [facility] so maybe it is not my time to go yet. Pt denies a hx of SI or attempts. Pt reports plan in october to end his life by not eating. Pt confirmed not eating for a day but to resume the next. Pt confirmed noncompliance with medications since Friday due to no longer wanting to take them. Pt reports to currently live alone and to agree with his grandson on the need for more assistance in caring from himself. Pt denies any SA, MH hx, INPT hx, OPT hx or family hx of MH/SA. Pt denies any current SI/HI/AH/VH and provided his HCPOA Douglas Calhoun [phone]) as a collateral contact.   Collateral contact with Douglas Calhoun: Douglas Calhoun confirmed his relationship to pt and corroborated the information provided by pt. Douglas Calhoun  identified his main concern for pt to be his current safety due to pt endorsing fleeting SI since October and refsuing to take medications. Douglas Calhoun reported pt not eating for 1 day in October and pt refusing to take his medications since Friday.  Douglas Calhoun denies pt to have a hx of SI or any MH related issues or background. 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 Douglas Calhoun reports a plan for pt to complete an evaluation with Douglas Calhoun (retirement community) tomorrow afternoon and his need for pt to remain overnight to ensure his safety.   Pt was admitted medically for hypokalemia before [place] could be completed.   Diagnosis: MDD  Past Medical History:  Past Medical History:  Diagnosis Date   Achilles tendinitis    Cardiomyopathy ([place])    a. 01/2019 Echo: EF 25%, glob HK.   Cataract    Chronic HFrEF (heart failure with reduced ejection fraction) ([place])    a. 03/2018 Echo: EF 45-50%); b. 01/2019 Echo: EF 25%. Glob HK. Triv AI/PR, Mod TR. RVSP 57.38mmHg.   Hearing loss    Herpes zoster without complication 4/76/5465   Permanent atrial fibrillation (HCC)    a. CHA2DS2VASc = 4-->Eliquis.   Personal history of prostate cancer 10/08/2014   s/p 15 radiation treatments treated by Dr. Madelin Calhoun    Prostate cancer [facility]) 03/14/2020   SSS (sick sinus syndrome) ([place])  a. s/p MDT Micra AV leadless PPM (ser # [id number]).   Varicose vein of leg     Past Surgical History:  Procedure Laterality Date   APPENDECTOMY  1960's   BREAST SURGERY     Breast Biopsy prior to [place] N/A 01/11/2019   Procedure: PACEMAKER LEADLESS INSERTION;  Surgeon: Isaias Cowman, MD;  Location: [place] [facility];  Service: Cardiovascular;  Laterality: N/A;    Family History:  Family History  Problem Relation Age of Onset   Heart disease Mother    Stroke Mother    Alcoholism Son      Social History:  reports that he has never smoked. He has never used smokeless tobacco. He reports previous alcohol use. He reports that he does not use drugs.  Additional Social History:  Alcohol / Drug Use Pain Medications: see mar Prescriptions: see mar Over the Counter: see mar History of alcohol / drug use?: No history of alcohol / drug abuse  CIWA: CIWA-Ar BP: 121/67 Pulse Rate: 76 COWS:    Allergies: No Known Allergies  Home Medications: (Not in a hospital admission)   OB/GYN Status:  No LMP for male patient.  General Assessment Data Location of Assessment: [facility] ED TTS Assessment: In system Is this a Tele or Face-to-Face Assessment?: Face-to-Face Is this an Initial Assessment or a Re-assessment for this encounter?: Initial Assessment Patient Accompanied by:: N/A Language Other than English: No Living Arrangements: Other (Comment) What gender do you identify as?: Male Date Telepsych consult ordered in [facility]: 04/06/20 Time Telepsych consult ordered in [facility]: 7 Marital status: Divorced Maiden name: n/a Pregnancy Status: No Living Arrangements: Alone Can pt return to current living arrangement?: Yes Admission Status: Voluntary Is patient capable of signing voluntary admission?: No Referral Source: Self/Family/Friend Insurance type: [place] Living Arrangements: Alone Legal Guardian:  (self) Name of Psychiatrist: None reported Name of Therapist: None reported  Education Status Is patient currently in school?: No Is the patient employed, unemployed or receiving disability?: Unemployed  Risk to self with the past 6 months Suicidal Ideation: No-Not Currently/Within Last 6 Months Has patient been a risk to self within the past 6 months prior to admission? : No Suicidal Intent: No-Not Currently/Within Last 6 Months Has patient had any suicidal intent within the past 6 months prior to admission? : Yes Is patient at risk for  suicide?: Yes Suicidal Plan?: Yes-Currently Present Has patient had any suicidal plan within the past 6 months prior to admission? : Yes Specify Current Suicidal Plan: stopped eating Access to Means: No What has been your use of drugs/alcohol within the last 12 months?: None reported Previous Attempts/Gestures: Yes How many times?: 1 Other Self Harm Risks: None reported Triggers for Past Attempts: Other (Comment) (Personal stressors) Intentional Self Injurious Behavior: None Family Suicide History: No Recent stressful life event(s): Other (Comment) (Personal stressors) Persecutory voices/beliefs?: No Depression: Yes Depression Symptoms: Loss of interest in usual pleasures Substance abuse history and/or treatment for substance abuse?: No Suicide prevention information given to non-admitted patients: Yes  Risk to Others within the past 6 months Homicidal Ideation: No Does patient have any lifetime risk of violence toward others beyond the six months prior to admission? : No Thoughts of Harm to Others: No Current Homicidal Intent: No Current Homicidal Plan: No Access to Homicidal Means: No Identified Victim: n/a History of harm to others?: No Assessment of Violence: None Noted Violent  Behavior Description: n/a Does patient have access to weapons?: No Criminal Charges Pending?: No Does patient have a court date: No Is patient on probation?: No  Psychosis Hallucinations: None noted Delusions: Unspecified  Mental Status Report Appearance/Hygiene: Unremarkable Eye Contact: Fair Motor Activity: Freedom of movement,Unsteady Speech: Logical/coherent,Slow,Tangential Level of Consciousness: Alert Mood: Depressed,Pleasant Affect: Depressed Anxiety Level: None Thought Processes: Coherent,Relevant,Tangential Judgement: Unimpaired Orientation: Appropriate for developmental age Obsessive Compulsive Thoughts/Behaviors: None  Cognitive Functioning Concentration: Good Memory:  Recent Intact,Remote Impaired Is patient IDD: No Insight: Fair Impulse Control: Good Appetite: Good Have you had any weight changes? : No Change Sleep: No Change Total Hours of Sleep: 8 Vegetative Symptoms: None  ADLScreening [facility] Assessment Services) Patient's cognitive ability adequate to safely complete daily activities?: No Patient able to express need for assistance with ADLs?: Yes Independently performs ADLs?: Yes (appropriate for developmental age)  Prior Inpatient Therapy Prior Inpatient Therapy: No  Prior Outpatient Therapy Prior Outpatient Therapy: No Does patient have an ACCT team?: No Does patient have Intensive In-House Services?  : No Does patient have [facility] services? : No Does patient have P4CC services?: No  ADL Screening (condition at time of admission) Patient's cognitive ability adequate to safely complete daily activities?: No Is the patient deaf or have difficulty hearing?: Yes (Pt wear hearing aids) Does the patient have difficulty seeing, even when wearing glasses/contacts?: No Does the patient have difficulty concentrating, remembering, or making decisions?: No Patient able to express need for assistance with ADLs?: Yes Does the patient have difficulty dressing or bathing?: Yes Independently performs ADLs?: Yes (appropriate for developmental age) Does the patient have difficulty walking or climbing stairs?: Yes Weakness of Legs: None Weakness of Arms/Hands: None  Home Assistive Devices/Equipment Home Assistive Devices/Equipment: Environmental consultant (specify type)  Therapy Consults (therapy consults require a physician order) PT Evaluation Needed: No OT Evalulation Needed: No SLP Evaluation Needed: No Abuse/Neglect Assessment (Assessment to be complete while patient is alone) Abuse/Neglect Assessment Can Be Completed: Yes Physical Abuse: Denies Verbal Abuse: Denies Sexual Abuse: Denies Exploitation of patient/patient's resources: Denies Self-Neglect:  Denies Values / Beliefs Cultural Requests During Hospitalization: None Spiritual Requests During Hospitalization: None Consults Spiritual Care Consult Needed: No Transition of Care Team Consult Needed: No Advance Directives (For Healthcare) Does Patient Have a Medical Advance Directive?: Yes Type of Advance Directive: Out of facility DNR (pink MOST or yellow form) Pre-existing out of facility DNR order (yellow form or pink MOST form): Yellow form placed in chart (order not valid for inpatient use)          Disposition:  Disposition Initial Assessment Completed for this Encounter: Yes Patient referred to: Other (Comment)  On Site Evaluation by:   Reviewed with Physician:    Shanon Ace 04/06/2020 4:26 PM

## 2020-04-06 NOTE — ED Notes (Signed)
Pt given dinner tray.

## 2020-04-06 NOTE — ED Notes (Signed)
Report to sylvia, rn.

## 2020-04-06 NOTE — H&P (Signed)
History and Physical    Douglas Calhoun YCX:[id number] DOB: 1920/02/23 DOA: 04/06/2020  PCP: Birdie Sons, MD   Patient coming from: Home  I have personally briefly reviewed patient's old medical records in [place]  Chief Complaint: Weakness  HPI: Douglas Calhoun is a 86 y.o. male with medical history significant for atrial fibrillation on Eliquis, discontinued on 02/01/2020 by cardiologist due to recent falls, systolic heart failure with last EF 25% on 02/03/2019, sick sinus syndrome status post pacemaker on 9/20 who was brought into the emergency room by EMS for evaluation of mental status changes.  Per family they told patient appeared more confused than his baseline.  Family reported to EMS that he had stopped taking his medications 2 days prior to his hospitalization because he no longer wishes to take them and would like to go be with the Douglas Calhoun.  Patient states that he lives in a retirement home and that the staff is there from Monday through Friday and are not there on the weekends.  He states that he decided to stop taking his medications with the hope that he could go be with the Douglas Calhoun but that has not happened.  He denies any thoughts of physically harming himself.  He complains of feeling weak but denies having any dizziness or lightheadedness. He denies having any nausea, no vomiting, no diarrhea, no abdominal pain, he denies chest pain, no shortness of breath, no cough, no fever or chills.   Labs reveal sodium 122, potassium less than 2, chloride 70, bicarb 34, glucose 124, BUN 64, creatinine 1.25, calcium 9.4, magnesium 2.5, total CK 83, white count 9.2, hemoglobin 14.1, hematocrit 38.8, MCV 97.5, RDW 12.2, platelet count 233 Respiratory viral panel is negative CT scan of the head without contrast shows no acute intracranial abnormalities.  Mild to moderate cerebral and cerebellar atrophy with extensive chronic microvascular ischemic changes in the cerebral white  matter Twelve-lead EKG reviewed by me shows sinus rhythm, PVCs and a right bundle branch block Chest x-ray reviewed by me shows minimal right basilar subsegmental atelectasis or scarring.  Increased left basilar atelectasis noted.      ED Course: Patient is a 86 year old Caucasian male who was brought into the ER by EMS for evaluation of increasing confusion and weakness.  Patient states he stopped taking his medications for 2 days with the hopes of passing away in his sleep.  He denies having any suicidal or homicidal ideations.  Labs reveal severe hypokalemia and hyponatremia.  Patient appears to be at his baseline mental status and is awake, alert and oriented to person place and time.  He will be admitted to the hospital for further evaluation.  Review of Systems: As per HPI otherwise 10 point review of systems negative.    Past Medical History:  Diagnosis Date  . Achilles tendinitis   . Cardiomyopathy (Douglas Calhoun)    a. 01/2019 Echo: EF 25%, glob HK.  . Cataract   . Chronic HFrEF (heart failure with reduced ejection fraction) (Douglas Calhoun)    a. 03/2018 Echo: EF 45-50%); b. 01/2019 Echo: EF 25%. Glob HK. Triv AI/PR, Mod TR. RVSP 57.1mmHg.  Douglas Calhoun Hearing loss   . Herpes zoster without complication 4/97/0263  . Permanent atrial fibrillation (HCC)    a. CHA2DS2VASc = 4-->Eliquis.  . Personal history of prostate cancer 10/08/2014   s/p 15 radiation treatments treated by Dr. Madelin Headings   . Prostate cancer (Douglas Calhoun) 03/14/2020  . SSS (sick sinus syndrome) (Douglas Calhoun)    a.  s/p MDT Micra AV leadless PPM (ser # [id number]).  . Varicose vein of leg     Past Surgical History:  Procedure Laterality Date  . APPENDECTOMY  1960's  . BREAST SURGERY     Breast Biopsy prior to 1960  . CATARACT EXTRACTION Left   . PACEMAKER LEADLESS INSERTION N/A 01/11/2019   Procedure: PACEMAKER LEADLESS INSERTION;  Surgeon: Isaias Cowman, MD;  Location: [place] [facility];  Service: Cardiovascular;  Laterality: N/A;     reports  that he has never smoked. He has never used smokeless tobacco. He reports previous alcohol use. He reports that he does not use drugs.  No Known Allergies  Family History  Problem Relation Age of Onset  . Heart disease Mother   . Stroke Mother   . Alcoholism Son      Prior to Admission medications   Medication Sig Start Date End Date Taking? Authorizing Provider  acetaminophen (TYLENOL) 325 MG tablet Take 2 tablets (650 mg total) by mouth every 6 (six) hours as needed for mild pain. 02/08/20   Jennye Boroughs, MD  amitriptyline (ELAVIL) 25 MG tablet Take 25 mg by mouth at bedtime. 05/18/19   [provider]  Ascorbic Acid (VITAMIN C) 1000 MG tablet Take 1,000 mg by mouth daily.    [provider]  Calcium Carbonate-Vitamin D (CALCIUM 500 + D PO) Take 1 tablet by mouth daily.    [provider]  cholecalciferol (VITAMIN D3) 25 MCG (1000 UT) tablet Take 2,000 Units by mouth daily.     [provider]  ferrous sulfate 325 (65 FE) MG EC tablet Take 325 mg by mouth daily with breakfast.    [provider]  gabapentin (NEURONTIN) 100 MG capsule TAKE 1 CAPSULE IN THE MORNING AND TWO AT NIGHT 04/05/19   Birdie Sons, MD  metolazone (ZAROXOLYN) 2.5 MG tablet Take 1 tablet (2.5 mg total) by mouth daily. 03/13/20 06/11/20  Kate Sable, MD  Multiple Vitamins-Minerals (MULTIVITAMIN ADULT PO) Take 1 tablet by mouth daily. 07/17/08   [provider]  Polyethylene Glycol 3350 (MIRALAX PO) Take 1 Dose by mouth daily. Heaping teaspoon    [provider]  Potassium Chloride ER 20 MEQ TBCR Take 2 tablets (40 MEQ) by mouth in the AM and in the PM. 03/27/20   Agbor-Etang, Aaron Edelman, MD  Psyllium (METAMUCIL PO) Take 1 Dose by mouth daily.    [provider]  pyridOXINE (VITAMIN B-6) 100 MG tablet Take 100 mg by mouth daily.    [provider]  sodium chloride 1 g tablet Take 1 tablet (1 g total) by mouth 2 (two) times daily with a  meal. 02/08/20   Jennye Boroughs, MD  tamsulosin (FLOMAX) 0.4 MG CAPS capsule Take 0.4 mg by mouth daily after breakfast.     [provider]  torsemide (DEMADEX) 20 MG tablet Take 1 tablet (20 mg) by mouth once daily 04/04/20   Kate Sable, MD  Vitamin E 400 units TABS Take 1 tablet by mouth daily. 07/17/08   [provider]    Physical Exam: Vitals:   04/06/20 1240 04/06/20 1511 04/06/20 1512 04/06/20 1515  BP:  121/67    Pulse:  76 76 76  Resp:  15 15 15   Temp:      TempSrc:      SpO2:  99% 99% 100%  Weight: 77.1 kg     Height: 5\' 7"  (1.702 m)        Vitals:  04/06/20 1240 04/06/20 1511 04/06/20 1512 04/06/20 1515  BP:  121/67    Pulse:  76 76 76  Resp:  15 15 15   Temp:      TempSrc:      SpO2:  99% 99% 100%  Weight: 77.1 kg     Height: 5\' 7"  (1.702 m)       Constitutional: NAD, alert and oriented x 3 Eyes: PERRL, lids and conjunctivae pallor ENMT: Mucous membranes are moist.  Neck: normal, supple, no masses, no thyromegaly Respiratory: clear to auscultation bilaterally, no wheezing, no crackles. Normal respiratory effort. No accessory muscle use.  Cardiovascular: Regular rate and rhythm, no murmurs / rubs / gallops. No extremity edema. 2+ pedal pulses. No carotid bruits.  Abdomen: no tenderness, no masses palpated. No hepatosplenomegaly. Bowel sounds positive.  Musculoskeletal: no clubbing / cyanosis. No joint deformity upper and lower extremities.  Skin: no rashes, lesions, ulcers.  Neurologic: No gross focal neurologic deficit. Psychiatric: Normal mood and affect.   Labs on Admission: I have personally reviewed following labs and imaging studies  CBC: Recent Labs  Lab 04/06/20 1416  WBC 9.2  HGB 14.1  HCT 38.8*  MCV 97.5  PLT 062   Basic Metabolic Panel: Recent Labs  Lab 04/03/20 1603 04/06/20 1416  NA 122* 122*  K 2.8* <2.0*  CL 71* 70*  CO2 37* 34*  GLUCOSE 125* 124*  BUN 61* 64*  CREATININE 1.53* 1.25*  CALCIUM 9.8  9.4  MG  --  2.5*   GFR: Estimated Creatinine Clearance: 30.8 mL/min (A) (by C-G formula based on SCr of 1.25 mg/dL (H)). Liver Function Tests: No results for input(s): AST, ALT, ALKPHOS, BILITOT, PROT, ALBUMIN in the last 168 hours. No results for input(s): LIPASE, AMYLASE in the last 168 hours. No results for input(s): AMMONIA in the last 168 hours. Coagulation Profile: No results for input(s): INR, PROTIME in the last 168 hours. Cardiac Enzymes: Recent Labs  Lab 04/06/20 1416  CKTOTAL 83   BNP (last 3 results) No results for input(s): PROBNP in the last 8760 hours. HbA1C: No results for input(s): HGBA1C in the last 72 hours. CBG: No results for input(s): GLUCAP in the last 168 hours. Lipid Profile: No results for input(s): CHOL, HDL, LDLCALC, TRIG, CHOLHDL, LDLDIRECT in the last 72 hours. Thyroid Function Tests: No results for input(s): TSH, T4TOTAL, FREET4, T3FREE, THYROIDAB in the last 72 hours. Anemia Panel: No results for input(s): VITAMINB12, FOLATE, FERRITIN, TIBC, IRON, RETICCTPCT in the last 72 hours. Urine analysis:    Component Value Date/Time   COLORURINE YELLOW (A) 02/02/2020 2117   APPEARANCEUR HAZY (A) 02/02/2020 2117   APPEARANCEUR Clear 04/02/2019 1448   LABSPEC 1.008 02/02/2020 2117   PHURINE 7.0 02/02/2020 2117   GLUCOSEU NEGATIVE 02/02/2020 2117   HGBUR NEGATIVE 02/02/2020 2117   BILIRUBINUR NEGATIVE 02/02/2020 2117   BILIRUBINUR Negative 04/02/2019 [place] 02/02/2020 2117   PROTEINUR NEGATIVE 02/02/2020 2117   UROBILINOGEN 0.2 08/14/2018 1436   NITRITE NEGATIVE 02/02/2020 2117   LEUKOCYTESUR LARGE (A) 02/02/2020 2117    Radiological Exams on Admission: CT Head Wo Contrast  Result Date: 04/06/2020 CLINICAL DATA:  86 year old male with history of mental status change. EXAM: CT HEAD WITHOUT CONTRAST TECHNIQUE: Contiguous axial images were obtained from the base of the skull through the vertex without intravenous contrast.  COMPARISON:  No priors. FINDINGS: Brain: Mild to moderate cerebral and mild cerebellar atrophy. Patchy and confluent areas of decreased attenuation are noted throughout the deep  and periventricular white matter of the cerebral hemispheres bilaterally, compatible with chronic microvascular ischemic disease. No evidence of acute infarction, hemorrhage, hydrocephalus, extra-axial collection or mass lesion/mass effect. Vascular: No hyperdense vessel or unexpected calcification. Skull: Normal. Negative for fracture or focal lesion. Sinuses/Orbits: No acute finding. Other: None. IMPRESSION: 1. No acute intracranial abnormalities. 2. Mild-to-moderate cerebral and cerebellar atrophy with extensive chronic microvascular ischemic changes in the cerebral white matter, as above. Electronically Signed   By: Vinnie Langton M.D.   On: 04/06/2020 15:35   DG Chest Port 1 View  Result Date: 04/06/2020 CLINICAL DATA:  Shortness of breath. EXAM: PORTABLE CHEST 1 VIEW COMPARISON:  March 14, 2020. FINDINGS: The heart size and mediastinal contours are within normal limits. No pneumothorax is noted. Minimal right basilar subsegmental atelectasis or scarring is noted. Increased left basilar atelectasis or infiltrate is noted. No pneumothorax or significant pleural effusion is noted. The visualized skeletal structures are unremarkable. IMPRESSION: Minimal right basilar subsegmental atelectasis or scarring. Increased left basilar atelectasis or infiltrate is noted. Electronically Signed   By: Marijo Conception M.D.   On: 04/06/2020 16:12    EKG: Independently reviewed.  Normal sinus rhythm PVCs Right bundle branch block  Assessment/Plan Principal Problem:   Hypokalemia Active Problems:   Chronic systolic heart failure (HCC)   Status cardiac pacemaker   Hyponatremia, acute on chronic   Generalized weakness   AF (paroxysmal atrial fibrillation) (HCC)       Hypokalemia Potassium levels less than 2 Most likely  secondary to diuretic use We will hold torsemide and metolazone Will supplement potassium Check magnesium levels    Generalized weakness Frequent falls Likely secondary to a combination of electrolyte abnormalities related to diuretic use Supplement potassium Continue sodium chloride tablets fluid restriction Place patient on fall precautions Request PT evaluation     Hyponatremia, acute on chronic Unclear etiology with concern for possible SIADH No evidence of volume overload at this time Will obtain urine sodium levels, serum and urine osmolality Place patient on fluid restriction Continue sodium chloride tablets Repeat electrolytes in a.m.      Chronic systolic heart failure ([place]), last EF 25% 10/20 Not acutely exacerbated Hold diuretic therapy   Paroxysmal atrial fibrillation Currently off all meds per review of cardiology consult note from 10/8 Not on chronic anticoagulation due to history of frequent falls     Status cardiac pacemaker No acute disease suspected   BPH Continue Flomax  DVT prophylaxis: Lovenox Code Status: DO NOT RESUSCITATE Family Communication: Greater than 50% of time was spent discussing patient's condition and plan of care with his grandson, Reginaldo Hazard and healthcare power of attorney over the phone.  All questions and concerns have been addressed.  He verbalizes understanding and agrees with the plan.  CODE STATUS was discussed and patient is a DO NOT RESUSCITATE Disposition Plan: Assisted living Consults called: Physical therapy/social work/case management    Ziah Leandro MD [facility]     04/06/2020, 4:51 PM

## 2020-04-06 NOTE — ED Notes (Signed)
Pt assisted to toilet to have a BM. Pt ambulatory with walker

## 2020-04-06 NOTE — ED Notes (Signed)
Pt transferred to room 38 with glasses on face, right hearing aid in ear, non skid socks in place and shirt, boxers, pants on pt.

## 2020-04-06 NOTE — ED Notes (Signed)
tts in with pt

## 2020-04-06 NOTE — ED Notes (Signed)
Repositioned pt's left leg, no other needs at present

## 2020-04-06 NOTE — ED Notes (Signed)
Date and time results received: 04/06/20   Test: K+ Critical Value: < 2  Name of Provider Notified: Quale MD  Orders Received? Or Actions Taken?: MD notified

## 2020-04-06 NOTE — ED Provider Notes (Signed)
[facility] Emergency Department Provider Note   ____________________________________________   Event Date/Time   First MD Initiated Contact with Patient 04/06/20 1252     (approximate)  I have reviewed the triage vital signs and the nursing notes.   HISTORY  Chief Complaint stopped taking medications    HPI Douglas Calhoun is a 86 y.o. male history of cardiac disease  Patient reports that he stopped taking his medications a couple days ago.  Reports his normal caretakers not been able to care for him due to family issues.  He reports that he has made the decision to stop taking his medications.  Denies that he wants to harm himself or commit suicide, but he does report that he has lived a long life and does not feel that he needs to be on medication any longer.  He does not want to prolong his life with medications, but reports rather he wants to allow himself to be able to die of natural causes  He does affirm a do not resuscitation and has with him same Discussed with his grandson, they report they are trying to get him into assisted living, but have not yet been able to arrange given how quickly this is happened.  They also are concerned as evidently he has been calling for help to neighbors lost quite a bit of mobility over the last several days and cannot safely live by himself at home alone.  Patient himself denies any medical issues at this point.  Denies shortness of breath no numbness weakness or tingling except he feels generally fatigued for the last few days.  No nausea vomiting no chest pain no headaches.   Past Medical History:  Diagnosis Date  . Achilles tendinitis   . Cardiomyopathy ([place])    a. 01/2019 Echo: EF 25%, glob HK.  . Cataract   . Chronic HFrEF (heart failure with reduced ejection fraction) ([place])    a. 03/2018 Echo: EF 45-50%); b. 01/2019 Echo: EF 25%. Glob HK. Triv AI/PR, Mod TR. RVSP 57.49mmHg.  Marland Kitchen Hearing loss   . Herpes  zoster without complication 8/84/1660  . Permanent atrial fibrillation (HCC)    a. CHA2DS2VASc = 4-->Eliquis.  . Personal history of prostate cancer 10/08/2014   s/p 15 radiation treatments treated by Dr. Madelin Headings   . Prostate cancer ([place]) 03/14/2020  . SSS (sick sinus syndrome) (HCC)    a. s/p MDT Micra AV leadless PPM (ser # [id number]).  . Varicose vein of leg     Patient Active Problem List   Diagnosis Date Noted  . AF (paroxysmal atrial fibrillation) ([place]) 04/06/2020  . Coagulation disorder ([place]) 03/14/2020  . Status cardiac pacemaker 02/02/2020  . Hyponatremia, acute on chronic 02/02/2020  . Hypokalemia 02/02/2020  . Generalized weakness 02/02/2020  . CAP (community acquired pneumonia) 02/02/2020  . Falls 02/02/2020  . Elevated troponin 02/02/2020  . Hypoxia 02/02/2020  . Compression fracture of lumbar spine, non-traumatic ([place]) 03/27/2019  . Hyponatremia with excess extracellular fluid volume 03/02/2019  . SOBOE (shortness of breath on exertion) 01/19/2019  . Sick sinus syndrome ([place]) 01/11/2019  . Chronic systolic heart failure ([place]) 04/17/2018  . Permanent atrial fibrillation ([place]) 04/17/2018  . Acute exacerbation of CHF (congestive heart failure) ([place]) 03/29/2018  . Strain of rotator cuff capsule 11/07/2017  . Edema 10/09/2014  . Anemia 10/08/2014  . PVC (premature ventricular contraction) 10/08/2014  . Urinary incontinence 10/08/2014  . Varicose veins of legs 10/08/2014  . Venous insufficiency 10/08/2014  .  Lipoma of skin, face 06/24/2008    Past Surgical History:  Procedure Laterality Date  . APPENDECTOMY  1960's  . BREAST SURGERY     Breast Biopsy prior to 1960  . CATARACT EXTRACTION Left   . PACEMAKER LEADLESS INSERTION N/A 01/11/2019   Procedure: PACEMAKER LEADLESS INSERTION;  Surgeon: Isaias Cowman, MD;  Location: [place] [facility];  Service: Cardiovascular;  Laterality: N/A;    Prior to Admission medications   Medication Sig Start Date End  Date Taking? Authorizing Provider  acetaminophen (TYLENOL) 325 MG tablet Take 2 tablets (650 mg total) by mouth every 6 (six) hours as needed for mild pain. 02/08/20   Jennye Boroughs, MD  amitriptyline (ELAVIL) 25 MG tablet Take 25 mg by mouth at bedtime. 05/18/19   [provider]  Ascorbic Acid (VITAMIN C) 1000 MG tablet Take 1,000 mg by mouth daily.    [provider]  Calcium Carbonate-Vitamin D (CALCIUM 500 + D PO) Take 1 tablet by mouth daily.    [provider]  cholecalciferol (VITAMIN D3) 25 MCG (1000 UT) tablet Take 2,000 Units by mouth daily.     [provider]  ferrous sulfate 325 (65 FE) MG EC tablet Take 325 mg by mouth daily with breakfast.    [provider]  gabapentin (NEURONTIN) 100 MG capsule TAKE 1 CAPSULE IN THE MORNING AND TWO AT NIGHT 04/05/19   Birdie Sons, MD  metolazone (ZAROXOLYN) 2.5 MG tablet Take 1 tablet (2.5 mg total) by mouth daily. 03/13/20 06/11/20  Kate Sable, MD  Multiple Vitamins-Minerals (MULTIVITAMIN ADULT PO) Take 1 tablet by mouth daily. 07/17/08   [provider]  Polyethylene Glycol 3350 (MIRALAX PO) Take 1 Dose by mouth daily. Heaping teaspoon    [provider]  Potassium Chloride ER 20 MEQ TBCR Take 2 tablets (40 MEQ) by mouth in the AM and in the PM. 03/27/20   Agbor-Etang, Aaron Edelman, MD  Psyllium (METAMUCIL PO) Take 1 Dose by mouth daily.    [provider]  pyridOXINE (VITAMIN B-6) 100 MG tablet Take 100 mg by mouth daily.    [provider]  sodium chloride 1 g tablet Take 1 tablet (1 g total) by mouth 2 (two) times daily with a meal. 02/08/20   Jennye Boroughs, MD  tamsulosin (FLOMAX) 0.4 MG CAPS capsule Take 0.4 mg by mouth daily after breakfast.     [provider]  torsemide (DEMADEX) 20 MG tablet Take 1 tablet (20 mg) by mouth once daily 04/04/20   Kate Sable, MD  Vitamin E 400 units TABS Take 1 tablet by mouth daily. 07/17/08   [provider]    Allergies Patient has no known allergies.  Family History  Problem Relation Age of Onset  . Heart disease Mother   . Stroke Mother   . Alcoholism Son     Social History Social History   Tobacco Use  . Smoking status: Never Smoker  . Smokeless tobacco: Never Used  Vaping Use  . Vaping Use: Never used  Substance Use Topics  . Alcohol use: Not Currently    Alcohol/week: 0.0 standard drinks  . Drug use: No    Review of Systems Constitutional: No fever/chills ENT: No sore throat. Cardiovascular: Denies chest pain. Respiratory: Denies shortness of breath. Gastrointestinal: No abdominal pain.   Genitourinary: Negative for dysuria. Musculoskeletal: Negative for back pain. Neurological: Negative for headaches, areas of focal weakness or numbness.    ____________________________________________   PHYSICAL EXAM:  VITAL  SIGNS: ED Triage Vitals  Enc Vitals Group     BP 04/06/20 1236 123/62     Pulse Rate 04/06/20 1236 89     Resp 04/06/20 1236 18     Temp 04/06/20 1239 97.6 F (36.4 C)     Temp Source 04/06/20 1239 Oral     SpO2 04/06/20 1236 99 %     Weight 04/06/20 1240 170 lb (77.1 kg)     Height 04/06/20 1240 5\' 7"  (1.702 m)     Head Circumference --      Peak Flow --      Pain Score 04/06/20 1240 0     Pain Loc --      Pain Edu? --      Excl. in [place]? --     Constitutional: Alert and oriented. Well appearing and in no acute distress.  He is fully oriented to month date and year and situation.  Does not appear acutely confused or delirious. Eyes: Conjunctivae are normal. Head: Atraumatic. Nose: No congestion/rhinnorhea. Mouth/Throat: Mucous membranes are moist. Neck: No stridor.  Cardiovascular: Normal rate, regular rhythm. Grossly normal heart sounds.  Good peripheral circulation. Respiratory: Normal respiratory effort.  No retractions. Lungs CTAB. Gastrointestinal: Soft and nontender. No distention. Musculoskeletal: No lower extremity  tenderness nor edema. Neurologic:  Normal speech and language. No gross focal neurologic deficits are appreciated.  Skin:  Skin is warm, dry and intact. No rash noted. Psychiatric: Mood and affect are normal. Speech and behavior are normal.  He denies suicidal or injurious ideations.  Does not want to harm himself.  He does however report that he wishes to be in heaven with his God, and that he does not want to take medications any longer to keep himself alive.  ____________________________________________   LABS (all labs ordered are listed, but only abnormal results are displayed)  Labs Reviewed  CBC - Abnormal; Notable for the following components:      Result Value   RBC 3.98 (*)    HCT 38.8 (*)    MCH 35.4 (*)    MCHC 36.3 (*)    All other components within normal limits  BASIC METABOLIC PANEL - Abnormal; Notable for the following components:   Sodium 122 (*)    Potassium <2.0 (*)    Chloride 70 (*)    CO2 34 (*)    Glucose, Bld 124 (*)    BUN 64 (*)    Creatinine, Ser 1.25 (*)    GFR, Estimated 52 (*)    Anion gap 18 (*)    All other components within normal limits  MAGNESIUM - Abnormal; Notable for the following components:   Magnesium 2.5 (*)    All other components within normal limits  RESP PANEL BY RT-PCR (FLU A&B, COVID) ARPGX2  CK  URINALYSIS, COMPLETE (UACMP) WITH MICROSCOPIC  ACETAMINOPHEN LEVEL  SALICYLATE LEVEL  BRAIN NATRIURETIC PEPTIDE  TSH  BASIC METABOLIC PANEL  CBC   ____________________________________________  EKG  Reviewed inter by me at 1515 Heart rate 80 QRS 189 QTc 540 Right bundle branch block.  Mild nonspecific T wave abnormalities.  Mild QT prolongation ____________________________________________  RADIOLOGY  CT Head Wo Contrast  Result Date: 04/06/2020 CLINICAL DATA:  86 year old male with history of mental status change. EXAM: CT HEAD WITHOUT CONTRAST TECHNIQUE: Contiguous axial images were obtained from the base of the skull  through the vertex without intravenous contrast. COMPARISON:  No priors. FINDINGS: Brain: Mild to moderate cerebral and mild cerebellar atrophy. Patchy and  confluent areas of decreased attenuation are noted throughout the deep and periventricular white matter of the cerebral hemispheres bilaterally, compatible with chronic microvascular ischemic disease. No evidence of acute infarction, hemorrhage, hydrocephalus, extra-axial collection or mass lesion/mass effect. Vascular: No hyperdense vessel or unexpected calcification. Skull: Normal. Negative for fracture or focal lesion. Sinuses/Orbits: No acute finding. Other: None. IMPRESSION: 1. No acute intracranial abnormalities. 2. Mild-to-moderate cerebral and cerebellar atrophy with extensive chronic microvascular ischemic changes in the cerebral white matter, as above. Electronically Signed   By: Vinnie Langton M.D.   On: 04/06/2020 15:35   DG Chest Port 1 View  Result Date: 04/06/2020 CLINICAL DATA:  Shortness of breath. EXAM: PORTABLE CHEST 1 VIEW COMPARISON:  March 14, 2020. FINDINGS: The heart size and mediastinal contours are within normal limits. No pneumothorax is noted. Minimal right basilar subsegmental atelectasis or scarring is noted. Increased left basilar atelectasis or infiltrate is noted. No pneumothorax or significant pleural effusion is noted. The visualized skeletal structures are unremarkable. IMPRESSION: Minimal right basilar subsegmental atelectasis or scarring. Increased left basilar atelectasis or infiltrate is noted. Electronically Signed   By: Marijo Conception M.D.   On: 04/06/2020 16:12    Imaging studies reviewed, negative for acute intracranial findings.  Chest x-ray nonspecific findings of possible atelectasis or scarring versus infiltrate.  He does not exhibit any obvious infectious symptoms to my exam at this time ____________________________________________   PROCEDURES  Procedure(s) performed:  None  Procedures  Critical Care performed: Yes, see critical care note(s)  CRITICAL CARE Performed by: Delman Kitten   Total critical care time: 30 minutes  Critical care time was exclusive of separately billable procedures and treating other patients.  Critical care was necessary to treat or prevent imminent or life-threatening deterioration.  Critical care was time spent personally by me on the following activities: development of treatment plan with patient and/or surrogate as well as nursing, discussions with consultants, evaluation of patient's response to treatment, examination of patient, obtaining history from patient or surrogate, ordering and performing treatments and interventions, ordering and review of laboratory studies, ordering and review of radiographic studies, pulse oximetry and re-evaluation of patient's condition.  ____________________________________________   INITIAL IMPRESSION / ASSESSMENT AND PLAN / ED COURSE  Pertinent labs & imaging results that were available during my care of the patient were reviewed by me and considered in my medical decision making (see chart for details).   Patient noted on labs to have severe hypokalemia less than 2.  Replete potassium.  For this reason with associated increased fatigue decreased mobility will admit to the hospital for further work-up and care.  Have communicated with his family as well will be coming up to see him and help with arrangements.  Social work and psychiatry consults pending.   Admission discussed with Dr. Francine Graven  Clinical Course as of 04/06/20 1705  Sun Apr 06, 2020  [facility]) reports he and his mother are his HCPOA and decision makers. Agree with plan to have psychiatry evaluate and needs a plan for being able to discharge to some type of care facility moving forward. Family ok with him not taking his medications but needs to have a plan to have care either at home of in a facility. [MQ]   1407 Patient has a valid DNR with him. Yolanda Bonine affirms this.  [MQ]    Clinical Course User Index [MQ] Delman Kitten, MD     ____________________________________________   FINAL CLINICAL IMPRESSION(S) / ED DIAGNOSES  Final  diagnoses:  Hypokalemia        Note:  This document was prepared using Dragon voice recognition software and may include unintentional dictation errors       Delman Kitten, MD 04/06/20 1705

## 2020-04-06 NOTE — ED Notes (Signed)
Pt watching tv in no acute distress, sipping on po fluids.

## 2020-04-06 NOTE — ED Triage Notes (Signed)
Pt arrived via ems from home. EMS reports they were contacted by family for pt being more confused than normal. Family reported to ems that he stopped taking his medications on Friday because he no longer wishes to take them and would like to pass away. Pt alert and oriented x4 on arrival. Pt states he was "hoping the good lord would take me in my sleep". Pt expresses wishes to stop taking his medications. Denies any thoughts of physically harming himself or anyone else. NAD noted.    Ems vitals 116/52 cbg 148 80 HR 96-97%  22g iv in left hand.

## 2020-04-06 NOTE — ED Notes (Signed)
Pt's daughter in law to desk, pt's daughter in law stating that her son Douglas Calhoun has HCPOA, per daughter in law Douglas Calhoun who was previously listed in chart as friend is not to have any contact/visit or have any updates regarding patient care. This RN updated patient demographics to grandson/HCPOA phone number. Pt's daughter in law also to desk to state that grandson does not want patient discharged is working on getting patient placed at [facility].

## 2020-04-07 DIAGNOSIS — E871 Hypo-osmolality and hyponatremia: Secondary | ICD-10-CM

## 2020-04-07 DIAGNOSIS — Z66 Do not resuscitate: Secondary | ICD-10-CM

## 2020-04-07 DIAGNOSIS — Z515 Encounter for palliative care: Secondary | ICD-10-CM | POA: Insufficient documentation

## 2020-04-07 DIAGNOSIS — Z7189 Other specified counseling: Secondary | ICD-10-CM | POA: Insufficient documentation

## 2020-04-07 LAB — CBC
HCT: 37 % — ABNORMAL LOW (ref 39.0–52.0)
Hemoglobin: 13.4 g/dL (ref 13.0–17.0)
MCH: 35.7 pg — ABNORMAL HIGH (ref 26.0–34.0)
MCHC: 36.2 g/dL — ABNORMAL HIGH (ref 30.0–36.0)
MCV: 98.7 fL (ref 80.0–100.0)
Platelets: 238 10*3/uL (ref 150–400)
RBC: 3.75 MIL/uL — ABNORMAL LOW (ref 4.22–5.81)
RDW: 12.6 % (ref 11.5–15.5)
WBC: 9.5 10*3/uL (ref 4.0–10.5)
nRBC: 0 % (ref 0.0–0.2)

## 2020-04-07 LAB — SODIUM
Sodium: 125 mmol/L — ABNORMAL LOW (ref 135–145)
Sodium: 128 mmol/L — ABNORMAL LOW (ref 135–145)

## 2020-04-07 LAB — OSMOLALITY, URINE: Osmolality, Ur: 326 mOsm/kg (ref 300–900)

## 2020-04-07 LAB — BASIC METABOLIC PANEL
Anion gap: 14 (ref 5–15)
BUN: 57 mg/dL — ABNORMAL HIGH (ref 8–23)
CO2: 33 mmol/L — ABNORMAL HIGH (ref 22–32)
Calcium: 9.4 mg/dL (ref 8.9–10.3)
Chloride: 78 mmol/L — ABNORMAL LOW (ref 98–111)
Creatinine, Ser: 1.18 mg/dL (ref 0.61–1.24)
GFR, Estimated: 56 mL/min — ABNORMAL LOW (ref >60)
Glucose, Bld: 112 mg/dL — ABNORMAL HIGH (ref 70–99)
Potassium: 3 mmol/L — ABNORMAL LOW (ref 3.5–5.1)
Sodium: 125 mmol/L — ABNORMAL LOW (ref 135–145)

## 2020-04-07 LAB — SALICYLATE LEVEL: Salicylate Lvl: <7 mg/dL — ABNORMAL LOW (ref 7.0–30.0)

## 2020-04-07 LAB — SODIUM, URINE, RANDOM: Sodium, Ur: 16 mmol/L

## 2020-04-07 LAB — T4, FREE: Free T4: 1.01 ng/dL (ref 0.61–1.12)

## 2020-04-07 LAB — ACETAMINOPHEN LEVEL: Acetaminophen (Tylenol), Serum: <10 ug/mL — ABNORMAL LOW (ref 10–30)

## 2020-04-07 LAB — POTASSIUM: Potassium: 4 mmol/L (ref 3.5–5.1)

## 2020-04-07 LAB — OSMOLALITY: Osmolality: 280 mOsm/kg (ref 275–295)

## 2020-04-07 MED ORDER — POTASSIUM CHLORIDE CRYS ER 20 MEQ PO TBCR
40.0000 meq | EXTENDED_RELEASE_TABLET | ORAL | Status: AC
  Administered 2020-04-07: 40 meq via ORAL
  Filled 2020-04-07 (×2): qty 2

## 2020-04-07 MED ORDER — SODIUM CHLORIDE 0.9 % IV SOLN
1.0000 g | INTRAVENOUS | Status: DC
  Administered 2020-04-07 – 2020-04-10 (×4): 1 g via INTRAVENOUS
  Filled 2020-04-07: qty 10
  Filled 2020-04-07 (×2): qty 1
  Filled 2020-04-07 (×2): qty 10

## 2020-04-07 MED ORDER — ENOXAPARIN SODIUM 40 MG/0.4ML ~~LOC~~ SOLN: 40.0000 mg | SUBCUTANEOUS | Status: DC

## 2020-04-07 NOTE — Progress Notes (Signed)
PMT consult received and chart reviewed. Met with patient at bedside and spoke with grandson/POA via telephone to discuss goals. Grandson confirms DNR. Yolanda Bonine is traveling from [place] this morning and plans to meet with [name] staff for transfer to ALF once medically stable for discharge. Patient is hopeful for ALF room at [facility]. Yolanda Bonine will be at [facility] tomorrow afternoon and interested in meeting with PMT provider and Mr. Wolden to discuss MOST form.   Full palliative note to follow.   NO CHARGE  Ihor Dow, [place], FNP-C Palliative Medicine Team  Phone: [phone] Fax: [phone]

## 2020-04-07 NOTE — Consult Note (Signed)
PHARMACIST - PHYSICIAN COMMUNICATION  CONCERNING:  Enoxaparin (Lovenox) for DVT Prophylaxis    RECOMMENDATION: Patient was prescribed enoxaprin 30mg  q24 hours for VTE prophylaxis.   Filed Weights   04/06/20 1240  Weight: 77.1 kg (170 lb)    Body mass index is 26.63 kg/m.  Estimated Creatinine Clearance: 32.7 mL/min (by C-G formula based on SCr of 1.18 mg/dL).   Patient is candidate for enoxaparin 30mg  every 24 hours based on CrCl >11ml/min AND Weight >45kg  DESCRIPTION: Pharmacy has adjusted enoxaparin dose per [facility] policy.  Patient is now receiving enoxaparin 40 mg every 24 hours    Lu Duffel, PharmD, BCPS Clinical Pharmacist 04/07/2020 7:41 AM

## 2020-04-07 NOTE — NC FL2 (Addendum)
[place] LEVEL OF CARE SCREENING TOOL     IDENTIFICATION  Patient Name: Douglas Calhoun Birthdate: 10-07-1919 Sex: male Admission Date (Current Location): 04/06/2020  [facility] and [place] Number:  Engineering geologist and Address:  [facility], [address] [place], [place] [address]      Provider Number: [id number]  Attending Physician Name and Address:  Darliss Cheney, MD  Relative Name and Phone Number:  [name], [facility])   [phone]    Garvin Fila [phone] (daughter-in-law)    Current Level of Care: Hospital Recommended Level of Care: [place] Prior Approval Number:    Date Approved/Denied:   PASRR Number: [id number]  Discharge Plan: Other (Comment) ([place])    Current Diagnoses: Patient Active Problem List   Diagnosis Date Noted   AF (paroxysmal atrial fibrillation) ([place]) 04/06/2020   Coagulation disorder ([place]) 03/14/2020   Status cardiac pacemaker 02/02/2020   Hyponatremia, acute on chronic 02/02/2020   Hypokalemia 02/02/2020   Generalized weakness 02/02/2020   CAP (community acquired pneumonia) 02/02/2020   Falls 02/02/2020   Elevated troponin 02/02/2020   Hypoxia 02/02/2020   Compression fracture of lumbar spine, non-traumatic ([place]) 03/27/2019   Hyponatremia with excess extracellular fluid volume 03/02/2019   SOBOE (shortness of breath on exertion) 01/19/2019   Sick sinus syndrome ([place]) 91/63/8466   Chronic systolic heart failure ([place]) 04/17/2018   Permanent atrial fibrillation ([place]) 04/17/2018   Acute exacerbation of CHF (congestive heart failure) ([place]) 03/29/2018   Strain of rotator cuff capsule 11/07/2017   Edema 10/09/2014   Anemia 10/08/2014   PVC (premature ventricular contraction) 10/08/2014   Urinary incontinence 10/08/2014   Varicose veins of legs 10/08/2014   Venous insufficiency 10/08/2014   Lipoma of  skin, face 06/24/2008    Orientation RESPIRATION BLADDER Height & Weight     Self,Place,Situation  Normal Continent Weight: 170 lb (77.1 kg) Height:  5\' 7"  (170.2 cm)  BEHAVIORAL SYMPTOMS/MOOD NEUROLOGICAL BOWEL NUTRITION STATUS      Continent Diet  AMBULATORY STATUS COMMUNICATION OF NEEDS Skin   Limited Assist Verbally Normal                       Personal Care Assistance Level of Assistance  Bathing,Feeding,Dressing,Total care Bathing Assistance: Limited assistance Feeding assistance: Independent Dressing Assistance: Limited assistance Total Care Assistance: Independent   Functional Limitations Info  Sight,Hearing,Speech Sight Info: Adequate Hearing Info: Impaired Speech Info: Adequate    SPECIAL CARE FACTORS FREQUENCY                       Contractures Contractures Info: Not present    Additional Factors Info                  Current Medications (04/07/2020):  This is the current hospital active medication list Current Facility-Administered Medications  Medication Dose Route Frequency Provider Last Rate Last Admin   0.9 %  sodium chloride infusion  250 mL Intravenous PRN Agbata, Tochukwu, MD       acetaminophen (TYLENOL) tablet 650 mg  650 mg Oral Q6H PRN Agbata, Tochukwu, MD       Or   acetaminophen (TYLENOL) suppository 650 mg  650 mg Rectal Q6H PRN Agbata, Tochukwu, MD       acetaminophen (TYLENOL) tablet 650 mg  650 mg Oral Q6H PRN Agbata, Tochukwu, MD       amitriptyline (ELAVIL) tablet 25 mg  25 mg Oral QHS Agbata, Tochukwu, MD   25 mg at 04/06/20 2225   ascorbic acid (VITAMIN C) tablet 1,000 mg  1,000 mg Oral Daily Agbata, Tochukwu, MD   1,000 mg at 04/07/20 0355   calcium-vitamin D (OSCAL WITH D) 500-200 MG-UNIT per tablet 1 tablet  1 tablet Oral Daily Agbata, Tochukwu, MD   1 tablet at 04/06/20 1836   cefTRIAXone (ROCEPHIN) 1 g in sodium chloride 0.9 % 100 mL IVPB  1 g Intravenous Q24H Darliss Cheney, MD   Stopped at 04/07/20 1254    cholecalciferol (VITAMIN D3) tablet 2,000 Units  2,000 Units Oral Daily Agbata, Tochukwu, MD       ferrous sulfate tablet 325 mg  325 mg Oral Q breakfast Agbata, Tochukwu, MD       gabapentin (NEURONTIN) capsule 300 mg  300 mg Oral Daily Agbata, Tochukwu, MD   300 mg at 04/07/20 9741   gabapentin (NEURONTIN) capsule 600 mg  600 mg Oral QHS Agbata, Tochukwu, MD   600 mg at 04/06/20 2226   multivitamin with minerals tablet 1 tablet  1 tablet Oral Daily Agbata, Tochukwu, MD   1 tablet at 04/07/20 0929   ondansetron (ZOFRAN) tablet 4 mg  4 mg Oral Q6H PRN Agbata, Tochukwu, MD       Or   ondansetron (ZOFRAN) injection 4 mg  4 mg Intravenous Q6H PRN Agbata, Tochukwu, MD       polyethylene glycol (MIRALAX / GLYCOLAX) packet 17 g  17 g Oral Daily Agbata, Tochukwu, MD       potassium chloride SA (KLOR-CON) CR tablet 40 mEq  40 mEq Oral Q4H Pahwani, Einar Grad, MD   40 mEq at 04/07/20 1222   pyridOXINE (VITAMIN B-6) tablet 100 mg  100 mg Oral Daily Agbata, Tochukwu, MD       sodium chloride flush (NS) 0.9 % injection 3 mL  3 mL Intravenous Q12H Agbata, Tochukwu, MD   3 mL at 04/07/20 0931   sodium chloride flush (NS) 0.9 % injection 3 mL  3 mL Intravenous PRN Agbata, Tochukwu, MD       sodium chloride tablet 1 g  1 g Oral BID WC Agbata, Tochukwu, MD   1 g at 04/07/20 0929   tamsulosin (FLOMAX) capsule 0.4 mg  0.4 mg Oral QPC breakfast Agbata, Tochukwu, MD   0.4 mg at 04/07/20 6384   vitamin E capsule 400 Units  400 Units Oral Daily Agbata, Tochukwu, MD       Current Outpatient Medications  Medication Sig Dispense Refill   acetaminophen (TYLENOL) 325 MG tablet Take 2 tablets (650 mg total) by mouth every 6 (six) hours as needed for mild pain. (Patient not taking: Reported on 04/06/2020)     amitriptyline (ELAVIL) 25 MG tablet Take 25 mg by mouth at bedtime. (Patient not taking: Reported on 04/06/2020)     Ascorbic Acid (VITAMIN C) 1000 MG tablet Take 1,000 mg by mouth daily. (Patient not  taking: Reported on 04/06/2020)     Calcium Carbonate-Vitamin D (CALCIUM 500 + D PO) Take 1 tablet by mouth daily. (Patient not taking: Reported on 04/06/2020)     cholecalciferol (VITAMIN D3) 25 MCG (1000 UT) tablet Take 2,000 Units by mouth daily.  (Patient not taking: Reported on 04/06/2020)     ferrous sulfate 325 (65 FE) MG EC tablet Take 325 mg by mouth daily with breakfast. (Patient not taking: Reported on 04/06/2020)     gabapentin (NEURONTIN) 100 MG capsule TAKE 1 CAPSULE IN  THE MORNING AND TWO AT NIGHT (Patient not taking: Reported on 04/06/2020) 90 capsule 5   metolazone (ZAROXOLYN) 2.5 MG tablet Take 1 tablet (2.5 mg total) by mouth daily. (Patient not taking: Reported on 04/06/2020) 30 tablet 3   Multiple Vitamins-Minerals (MULTIVITAMIN ADULT PO) Take 1 tablet by mouth daily. (Patient not taking: Reported on 04/06/2020)     Polyethylene Glycol 3350 (MIRALAX PO) Take 1 Dose by mouth daily. Heaping teaspoon (Patient not taking: Reported on 04/06/2020)     Potassium Chloride ER 20 MEQ TBCR Take 2 tablets (40 MEQ) by mouth in the AM and in the PM. (Patient not taking: Reported on 04/06/2020) 120 tablet 5   Psyllium (METAMUCIL PO) Take 1 Dose by mouth daily. (Patient not taking: Reported on 04/06/2020)     pyridOXINE (VITAMIN B-6) 100 MG tablet Take 100 mg by mouth daily. (Patient not taking: Reported on 04/06/2020)     sodium chloride 1 g tablet Take 1 tablet (1 g total) by mouth 2 (two) times daily with a meal. (Patient not taking: Reported on 04/06/2020) 10 tablet 0   tamsulosin (FLOMAX) 0.4 MG CAPS capsule Take 0.4 mg by mouth daily after breakfast.  (Patient not taking: Reported on 04/06/2020)     torsemide (DEMADEX) 20 MG tablet Take 1 tablet (20 mg) by mouth once daily (Patient not taking: Reported on 04/06/2020) 90 tablet 3   Vitamin E 400 units TABS Take 1 tablet by mouth daily. (Patient not taking: Reported on 04/06/2020)       Discharge Medications: Please see  discharge summary for a list of discharge medications.  Relevant Imaging Results:  Relevant Lab Results:   Additional Information SS# [id number]  Adelene Amas, LCSWA

## 2020-04-07 NOTE — Evaluation (Signed)
Physical Therapy Evaluation Patient Details Name: Douglas Calhoun MRN: [id number] DOB: [birth year] Today's Date: 04/07/2020   History of Present Illness  Douglas Calhoun is a 86 y.o. male with medical history significant for atrial fibrillation on Eliquis, discontinued on 02/01/2020 by cardiologist due to recent falls, systolic heart failure with last EF 25% on 02/03/2019, sick sinus syndrome status post pacemaker on 9/20 who was brought into the emergency room by EMS for evaluation of mental status changes.  Per family they told patient appeared more confused than his baseline.  Family reported to EMS that he had stopped taking his medications 2 days prior to his hospitalization because he no longer wishes to take them and would like to go be with the [place].  Clinical Impression  Patient received in bed, agrees to PT assessment. He is alert, oriented. Performed bed mobility with mod independent, transfers with supervision. Ambulated in room 25 feet with rw and supervision to min guard. His bed was soaking wet, he was able to stand to allow [place] to change linens. Patient will continue to benefit from skilled PT while here to improve functional mobility and independence.      Follow Up Recommendations Home health PT    Equipment Recommendations  None recommended by PT    Recommendations for Other Services       Precautions / Restrictions Precautions Precautions: Fall Restrictions Weight Bearing Restrictions: No      Mobility  Bed Mobility Overal bed mobility: Modified Independent             General bed mobility comments: use of bed rail, no physical assist    Transfers Overall transfer level: Needs assistance Equipment used: Rolling walker (2 wheeled) Transfers: Sit to/from Stand Sit to Stand: Supervision            Ambulation/Gait Ambulation/Gait assistance: Min guard Gait Distance (Feet): 25 Feet Assistive device: Rolling walker (2 wheeled) Gait Pattern/deviations:  Step-through pattern;Shuffle;Decreased step length - right;Decreased step length - left Gait velocity: decr   General Gait Details: patient ambulates with rw, no unsteadiness noted, shuffle gait.  Stairs            Wheelchair Mobility    Modified Rankin (Stroke Patients Only)       Balance Overall balance assessment: Modified Independent                                           Pertinent Vitals/Pain Pain Assessment: No/denies pain    Home Living Family/patient expects to be discharged to:: Assisted living Living Arrangements: Alone             Home Equipment: Walker - 4 wheels;Walker - 2 wheels;Shower seat Additional Comments: Per pt/grandson pt resident of [place], 1 meal provided daily and life alert pulls in apartment    Prior Function Level of Independence: Independent with assistive device(s)         Comments: walks with walker     Hand Dominance        Extremity/Trunk Assessment   Upper Extremity Assessment Upper Extremity Assessment: Generalized weakness    Lower Extremity Assessment Lower Extremity Assessment: Generalized weakness    Cervical / Trunk Assessment Cervical / Trunk Assessment: Kyphotic  Communication   Communication: No difficulties  Cognition Arousal/Alertness: Awake/alert Behavior During Therapy: WFL for tasks assessed/performed Overall Cognitive Status: Within Functional Limits for  tasks assessed                                        General Comments      Exercises     Assessment/Plan    PT Assessment Patient needs continued PT services  PT Problem List Decreased strength;Decreased mobility;Decreased activity tolerance       PT Treatment Interventions Therapeutic activities;Gait training;Therapeutic exercise;Functional mobility training;Balance training;Patient/family education    PT Goals (Current goals can be found in the Care Plan section)   Acute Rehab PT Goals Patient Stated Goal: to go to [facility] PT Goal Formulation: With patient Time For Goal Achievement: 04/14/20 Potential to Achieve Goals: Good    Frequency Min 2X/week   Barriers to discharge Decreased caregiver support      Co-evaluation               AM-PAC PT "6 Clicks" Mobility  Outcome Measure Help needed turning from your back to your side while in a flat bed without using bedrails?: None Help needed moving from lying on your back to sitting on the side of a flat bed without using bedrails?: None Help needed moving to and from a bed to a chair (including a wheelchair)?: A Little Help needed standing up from a chair using your arms (e.g., wheelchair or bedside chair)?: A Little Help needed to walk in hospital room?: A Little Help needed climbing 3-5 steps with a railing? : A Lot 6 Click Score: 19    End of Session Equipment Utilized During Treatment: Gait belt Activity Tolerance: Patient tolerated treatment well Patient left: in bed;with call bell/phone within reach;with bed alarm set Nurse Communication: Mobility status PT Visit Diagnosis: Muscle weakness (generalized) (M62.81);Difficulty in walking, not elsewhere classified (R26.2)    Time: 5170-0174 PT Time Calculation (min) (ACUTE ONLY): 16 min   Charges:   PT Evaluation $PT Eval Moderate Complexity: 1 Mod PT Treatments $Gait Training: 8-22 mins        Cougar Imel, PT, GCS 04/07/20,11:14 AM

## 2020-04-07 NOTE — TOC Initial Note (Signed)
Transition of Care [facility]) - Initial/Assessment Note    Patient Details  Name: Douglas Calhoun MRN: [id number] Date of Birth: 01/28/1920  Transition of [facility] CM/SW Contact:    Ova Freshwater Phone Number: [phone] 04/07/2020, 2:39 PM  Clinical Narrative:                  Patient presents to ED due to confusion.  CSW spoke with patient's grandson and Mychal Decarlo [phone], other contact is Florentina Jenny (daughter-in-law) [phone].  CSW explained role of TOC in patient care.  Mr. Yepiz stated the patient has a bed offer for assisted living at [place]. Mr. Mcelroy stated he was on his way to [facility] from [facility] and will be stopping by Kandis Mannan to pick up paperwork for the patient.  CSW contacted Mordecai Rasmussen [phone]  to confirm bed offer for the patient.  Debbie confirmed the patient had ALF room offer.  Pt has evaluated patient and recommended home health.     Expected Discharge Plan: [place] Barriers to Discharge: Continued Medical Work up   Patient Goals and CMS Choice Patient states their goals for this hospitalization and ongoing recovery are:: Patient has bed at [facility].  Debbie contact for [employer] [phone]      Expected Discharge Plan and Services Expected Discharge Plan: [place] In-house Referral: Clinical Social Work     Living arrangements for the past 2 months: Apartment                                      Prior Living Arrangements/Services Living arrangements for the past 2 months: Apartment Lives with:: Self Patient language and need for interpreter reviewed:: Yes Do you feel safe going back to the place where you live?: No   Patient lives alone and has bed offer at [facility]  Need for Family Participation in Patient Care: Yes (Comment) Care giver support system in place?: Yes (comment)   Criminal Activity/Legal Involvement Pertinent to Current  Situation/Hospitalization: No - Comment as needed  Activities of Daily Living Home Assistive Devices/Equipment: Walker (specify type) ADL Screening (condition at time of admission) Patient's cognitive ability adequate to safely complete daily activities?: No Is the patient deaf or have difficulty hearing?: Yes (Pt wear hearing aids) Does the patient have difficulty seeing, even when wearing glasses/contacts?: No Does the patient have difficulty concentrating, remembering, or making decisions?: No Patient able to express need for assistance with ADLs?: Yes Does the patient have difficulty dressing or bathing?: Yes Independently performs ADLs?: Yes (appropriate for developmental age) Does the patient have difficulty walking or climbing stairs?: Yes Weakness of Legs: None Weakness of Arms/Hands: None  Permission Sought/Granted Permission sought to share information with : Family Supports Permission granted to share information with : Yes, Verbal Permission Granted  Share Information with NAME: Suhaan, [place] [facility])   [phone] HCPOA        Permission granted to share info w Contact Information: Florentina Jenny [phone] (daughter-in-law)  Emotional Assessment Appearance:: Appears stated age Attitude/Demeanor/Rapport: Unable to Assess Affect (typically observed): Unable to Assess Orientation: : Oriented to Self,Oriented to Situation,Oriented to Place Alcohol / Substance Use: Not Applicable Psych Involvement: Yes (comment) (Patient does not meet psychiatric inpatient criteria.)  Admission diagnosis:  Hypokalemia [E87.6] Patient Active Problem List   Diagnosis Date Noted  . AF (paroxysmal  atrial fibrillation) ([place]) 04/06/2020  . Coagulation disorder ([place]) 03/14/2020  . Status cardiac pacemaker 02/02/2020  . Hyponatremia, acute on chronic 02/02/2020  . Hypokalemia 02/02/2020  . Generalized weakness 02/02/2020  . CAP (community acquired pneumonia) 02/02/2020  . Falls  02/02/2020  . Elevated troponin 02/02/2020  . Hypoxia 02/02/2020  . Compression fracture of lumbar spine, non-traumatic ([place]) 03/27/2019  . Hyponatremia with excess extracellular fluid volume 03/02/2019  . SOBOE (shortness of breath on exertion) 01/19/2019  . Sick sinus syndrome ([place]) 01/11/2019  . Chronic systolic heart failure ([place]) 04/17/2018  . Permanent atrial fibrillation ([place]) 04/17/2018  . Acute exacerbation of CHF (congestive heart failure) ([place]) 03/29/2018  . Strain of rotator cuff capsule 11/07/2017  . Edema 10/09/2014  . Anemia 10/08/2014  . PVC (premature ventricular contraction) 10/08/2014  . Urinary incontinence 10/08/2014  . Varicose veins of legs 10/08/2014  . Venous insufficiency 10/08/2014  . Lipoma of skin, face 06/24/2008   PCP:  Birdie Sons, MD Pharmacy:   [place], [place] - [place] [facility] [address] Phone: [phone] Fax: [phone]     Social Determinants of Health (SDOH) Interventions    Readmission Risk Interventions No flowsheet data found.

## 2020-04-07 NOTE — Progress Notes (Signed)
PROGRESS NOTE    Douglas Calhoun  FKC:[id number] DOB: [birth year] DOA: 04/06/2020 PCP: Birdie Sons, MD   Brief Narrative:  HPI: Douglas Calhoun is a 86 y.o. male with medical history significant for atrial fibrillation on Eliquis, discontinued on 02/01/2020 by cardiologist due to recent falls, systolic heart failure with last EF 25% on 02/03/2019, sick sinus syndrome status post pacemaker on 9/20 who was brought into the emergency room by EMS for evaluation of mental status changes.  Per family they told patient appeared more confused than his baseline.  Family reported to EMS that he had stopped taking his medications 2 days prior to his hospitalization because he no longer wishes to take them and would like to go be with the [place].  Patient states that he lives in a retirement home and that the staff is there from Monday through Friday and are not there on the weekends.  He states that he decided to stop taking his medications with the hope that he could go be with the [place] but that has not happened.  He denies any thoughts of physically harming himself.  He complains of feeling weak but denies having any dizziness or lightheadedness. He denies having any nausea, no vomiting, no diarrhea, no abdominal pain, he denies chest pain, no shortness of breath, no cough, no fever or chills.  Labs reveal sodium 122, potassium less than 2, chloride 70, bicarb 34, glucose 124, BUN 64, creatinine 1.25, calcium 9.4, magnesium 2.5, total CK 83, white count 9.2, hemoglobin 14.1, hematocrit 38.8, MCV 97.5, RDW 12.2, platelet count 233 Respiratory viral panel is negative CT scan of the head without contrast shows no acute intracranial abnormalities.  Mild to moderate cerebral and cerebellar atrophy with extensive chronic microvascular ischemic changes in the cerebral white matter Twelve-lead EKG reviewed by me shows sinus rhythm, PVCs and a right bundle branch block Chest x-ray reviewed by me shows minimal right  basilar subsegmental atelectasis or scarring.  Increased left basilar atelectasis noted.      ED Course: Patient is a 86 year old Caucasian male who was brought into the ER by EMS for evaluation of increasing confusion and weakness.  Patient states he stopped taking his medications for 2 days with the hopes of passing away in his sleep.  He denies having any suicidal or homicidal ideations.  Labs reveal severe hypokalemia and hyponatremia.  Patient appears to be at his baseline mental status and is awake, alert and oriented to person place and time.  He will be admitted to the hospital for further evaluation.  Assessment & Plan:   Principal Problem:   Hypokalemia Active Problems:   Chronic systolic heart failure (HCC)   Status cardiac pacemaker   Hyponatremia, acute on chronic   Generalized weakness   AF (paroxysmal atrial fibrillation) (HCC)   Hypokalemia: Potassium is still 3.0.  Will replace orally.  Recheck in the morning.  Generalized weakness/frequent falls: Due to combination of aging, deconditioning and multiple electrolyte abnormalities.  Consult PT OT.  Acute metabolic encephalopathy: Patient slightly alert but partially oriented.  Baseline unknown.  Treat underlying cause.  Acute on chronic hyponatremia: Unsure whether this is due to hypovolemia or SIADH.  Labs still pending.  His baseline sodium seems to be around 130.  He presented 122 which improved to 125.  Monitor closely.  Continue sodium tablets.  Chronic systolic heart failure ([place]), last EF 25% 10/20: Fairly compensated.  Holding diuretic therapy for now.  Paroxysmal atrial fibrillation: Currently off all meds  per review of cardiology consult note from 10/8 Not on chronic anticoagulation due to history of frequent falls.  Rates controlled.  Status cardiac pacemaker No acute disease suspected  BPH Continue Flomax  Elevated TSH: Concern for possible hypothyroid.  Checking free T3 and T4.  UTI?   Patient himself too confused to provide any history.  UA indicative of UTI in a male patient.  Will empirically treat with Rocephin and follow urine culture.  Aging/multiple medical problems: Consulted palliative care to have goal of care conversation with patient.  DVT prophylaxis:    Code Status: DNR  Family Communication: None present at bedside.  Palliative care has reached out to the grandson who will arrive from [place] today.  Status is: Inpatient  Remains inpatient appropriate because:Inpatient level of care appropriate due to severity of illness   Dispo: The patient is from: ALF              Anticipated d/c is to: ALF              Anticipated d/c date is: 1 day              Patient currently is not medically stable to d/c.        Estimated body mass index is 26.63 kg/m as calculated from the following:   Height as of this encounter: 5\' 7"  (1.702 m).   Weight as of this encounter: 77.1 kg.      Nutritional status:               Consultants:   None  Procedures:   None  Antimicrobials:  Anti-infectives (From admission, onward)   None         Subjective: Patient seen and examined.  He is alert but is still partially oriented.  Denied any specific complaint.  Objective: Vitals:   04/07/20 0800 04/07/20 0900 04/07/20 0908 04/07/20 1000  BP: (!) 95/53 (!) 107/51  (!) 117/96  Pulse: 62 66  78  Resp: 14 15  14   Temp:   (!) 97.5 F (36.4 C)   TempSrc:   Oral   SpO2: 92% (!) 86%  96%  Weight:      Height:       No intake or output data in the 24 hours ending 04/07/20 1048 Filed Weights   04/06/20 1240  Weight: 77.1 kg    Examination:  General exam: Appears calm and comfortable but confused Respiratory system: Clear to auscultation. Respiratory effort normal. Cardiovascular system: S1 & S2 heard, RRR. No JVD, murmurs, rubs, gallops or clicks. No pedal edema. Gastrointestinal system: Abdomen is nondistended, soft and nontender. No  organomegaly or masses felt. Normal bowel sounds heard. Central nervous system: Alert and oriented x2. No focal neurological deficits. Extremities: Symmetric 5 x 5 power. Skin: No rashes, lesions or ulcers  Data Reviewed: I have personally reviewed following labs and imaging studies  CBC: Recent Labs  Lab 04/06/20 1416 04/07/20 0422  WBC 9.2 9.5  HGB 14.1 13.4  HCT 38.8* 37.0*  MCV 97.5 98.7  PLT 233 761   Basic Metabolic Panel: Recent Labs  Lab 04/03/20 1603 04/06/20 1416 04/07/20 0422  NA 122* 122* 125*  K 2.8* <2.0* 3.0*  CL 71* 70* 78*  CO2 37* 34* 33*  GLUCOSE 125* 124* 112*  BUN 61* 64* 57*  CREATININE 1.53* 1.25* 1.18  CALCIUM 9.8 9.4 9.4  MG  --  2.5*  --    GFR: Estimated Creatinine Clearance: 32.7 mL/min (by  C-G formula based on SCr of 1.18 mg/dL). Liver Function Tests: No results for input(s): AST, ALT, ALKPHOS, BILITOT, PROT, ALBUMIN in the last 168 hours. No results for input(s): LIPASE, AMYLASE in the last 168 hours. No results for input(s): AMMONIA in the last 168 hours. Coagulation Profile: No results for input(s): INR, PROTIME in the last 168 hours. Cardiac Enzymes: Recent Labs  Lab 04/06/20 1416  CKTOTAL 83   BNP (last 3 results) No results for input(s): PROBNP in the last 8760 hours. HbA1C: No results for input(s): HGBA1C in the last 72 hours. CBG: No results for input(s): GLUCAP in the last 168 hours. Lipid Profile: No results for input(s): CHOL, HDL, LDLCALC, TRIG, CHOLHDL, LDLDIRECT in the last 72 hours. Thyroid Function Tests: Recent Labs    04/06/20 1416  TSH 7.167*   Anemia Panel: No results for input(s): VITAMINB12, FOLATE, FERRITIN, TIBC, IRON, RETICCTPCT in the last 72 hours. Sepsis Labs: No results for input(s): PROCALCITON, LATICACIDVEN in the last 168 hours.  Recent Results (from the past 240 hour(s))  Resp Panel by RT-PCR (Flu A&B, Covid) Nasopharyngeal Swab     Status: None   Collection Time: 04/06/20  2:16 PM    Specimen: Nasopharyngeal Swab; Nasopharyngeal(NP) swabs in vial transport medium  Result Value Ref Range Status   SARS Coronavirus 2 by RT PCR NEGATIVE NEGATIVE Final    Comment: (NOTE) SARS-CoV-2 target nucleic acids are NOT DETECTED.  The SARS-CoV-2 RNA is generally detectable in upper respiratory specimens during the acute phase of infection. The lowest concentration of SARS-CoV-2 viral copies this assay can detect is 138 copies/mL. A negative result does not preclude SARS-Cov-2 infection and should not be used as the sole basis for treatment or other patient management decisions. A negative result may occur with  improper specimen collection/handling, submission of specimen other than nasopharyngeal swab, presence of viral mutation(s) within the areas targeted by this assay, and inadequate number of viral copies(<138 copies/mL). A negative result must be combined with clinical observations, patient history, and epidemiological information. The expected result is Negative.  Fact Sheet for Patients:  EntrepreneurPulse.com.au  Fact Sheet for Healthcare Providers:  IncredibleEmployment.be  This test is no t yet approved or cleared by the [place] FDA and  has been authorized for detection and/or diagnosis of SARS-CoV-2 by FDA under an Emergency Use Authorization (EUA). This EUA will remain  in effect (meaning this test can be used) for the duration of the COVID-19 declaration under Section 564(b)(1) of the Act, 21 U.S.C.section 360bbb-3(b)(1), unless the authorization is terminated  or revoked sooner.       Influenza A by PCR NEGATIVE NEGATIVE Final   Influenza B by PCR NEGATIVE NEGATIVE Final    Comment: (NOTE) The Xpert Xpress SARS-CoV-2/FLU/RSV plus assay is intended as an aid in the diagnosis of influenza from Nasopharyngeal swab specimens and should not be used as a sole basis for treatment. Nasal washings and aspirates are  unacceptable for Xpert Xpress SARS-CoV-2/FLU/RSV testing.  Fact Sheet for Patients: EntrepreneurPulse.com.au  Fact Sheet for Healthcare Providers: IncredibleEmployment.be  This test is not yet approved or cleared by the [place] FDA and has been authorized for detection and/or diagnosis of SARS-CoV-2 by FDA under an Emergency Use Authorization (EUA). This EUA will remain in effect (meaning this test can be used) for the duration of the COVID-19 declaration under Section 564(b)(1) of the Act, 21 U.S.C. section 360bbb-3(b)(1), unless the authorization is terminated or revoked.  Performed at [facility], [address] [place], [place] [address]       Radiology Studies: CT Head Wo Contrast  Result Date: 04/06/2020 CLINICAL DATA:  86 year old male with history of mental status change. EXAM: CT HEAD WITHOUT CONTRAST TECHNIQUE: Contiguous axial images were obtained from the base of the skull through the vertex without intravenous contrast. COMPARISON:  No priors. FINDINGS: Brain: Mild to moderate cerebral and mild cerebellar atrophy. Patchy and confluent areas of decreased attenuation are noted throughout the deep and periventricular white matter of the cerebral hemispheres bilaterally, compatible with chronic microvascular ischemic disease. No evidence of acute infarction, hemorrhage, hydrocephalus, extra-axial collection or mass lesion/mass effect. Vascular: No hyperdense vessel or unexpected calcification. Skull: Normal. Negative for fracture or focal lesion. Sinuses/Orbits: No acute finding. Other: None. IMPRESSION: 1. No acute intracranial abnormalities. 2. Mild-to-moderate cerebral and cerebellar atrophy with extensive chronic microvascular ischemic changes in the cerebral white matter, as above. Electronically Signed   By: Vinnie Langton M.D.   On: 04/06/2020 15:35   DG Chest Port 1 View  Result Date: 04/06/2020 CLINICAL DATA:   Shortness of breath. EXAM: PORTABLE CHEST 1 VIEW COMPARISON:  March 14, 2020. FINDINGS: The heart size and mediastinal contours are within normal limits. No pneumothorax is noted. Minimal right basilar subsegmental atelectasis or scarring is noted. Increased left basilar atelectasis or infiltrate is noted. No pneumothorax or significant pleural effusion is noted. The visualized skeletal structures are unremarkable. IMPRESSION: Minimal right basilar subsegmental atelectasis or scarring. Increased left basilar atelectasis or infiltrate is noted. Electronically Signed   By: Marijo Conception M.D.   On: 04/06/2020 16:12    Scheduled Meds: . amitriptyline  25 mg Oral QHS  . vitamin C  1,000 mg Oral Daily  . calcium-vitamin D  1 tablet Oral Daily  . cholecalciferol  2,000 Units Oral Daily  . ferrous sulfate  325 mg Oral Q breakfast  . gabapentin  300 mg Oral Daily  . gabapentin  600 mg Oral QHS  . multivitamin with minerals  1 tablet Oral Daily  . polyethylene glycol  17 g Oral Daily  . potassium chloride  40 mEq Oral Q4H  . pyridOXINE  100 mg Oral Daily  . sodium chloride flush  3 mL Intravenous Q12H  . sodium chloride  1 g Oral BID WC  . tamsulosin  0.4 mg Oral QPC breakfast  . vitamin E  400 Units Oral Daily   Continuous Infusions: . sodium chloride       LOS: 1 day   Time spent: 36 minutes   Darliss Cheney, MD [facility]  04/07/2020, 10:48 AM   To contact the attending provider between 7A-7P or the covering provider during after hours 7P-7A, please log into the web site [email or web address].

## 2020-04-07 NOTE — ED Notes (Signed)
Pt given cup of water per request. Pt alert and oriented x3, disoriented to time. Does know that son is working on getting him to [employer]. Pt talkative, polite, calm (though very hard of hearing).  Pt denies further needs at this time

## 2020-04-07 NOTE — Consult Note (Signed)
Consultation Note Date: 04/07/2020   Patient Name: Douglas Calhoun  DOB: [birth year]  MRN: [id number]  Age / Sex: 86 y.o., male  PCP: Birdie Sons, MD Referring Physician: Darliss Cheney, MD  Reason for Consultation: Establishing goals of care  HPI/Patient Profile: 86 y.o. male  with past medical history of systolic heart failure with EF 25%, sick sinus syndrome s/p pacemaker, atrial fibrillation on Eliquis but recently discontinued by cardiology due to recent falls admitted on 04/06/2020 with mental status changes. Per family, patient stopped taking medications 2 days prior to hospitalization because he no longer wished to take them and wanted to go be with the [facility]. Hospital admission while he waits on ALF bed. Patient with hypokalemia, acute on chronic hyponatremia, chronic systolic heart failure. Palliative medicine consultation for goals of care.   Clinical Assessment and Goals of Care:  I have reviewed medical records, discussed with care team, and met with patient at bedside. [name] is awake, alert, oriented and in good spirits this morning. He has eaten almost 100% of breakfast. He denies pain or discomfort.   I introduced Palliative Medicine as specialized medical care for people living with serious illness. It focuses on providing relief from the symptoms and stress of a serious illness. The goal is to improve quality of life for both the patient and the family.  Patient shares a brief life review. His biggest support system are his two grandsons, one local but reported HCPOA (Teddy Spike) lives in [place], [place]. [name] is relieved that Thurmond Butts is on his way this morning from [place] to meet with staff at [facility] to coordinate transfer to ALF room once discharged from the hospital. Patient shares that his second wife who suffers from dementia is currently living at [facility]. He is hopeful to get a  room there and be closer to her.   Patient is in good spirits and appreciative of visit. He does share that he is "ready to go" and at his "tender age, there is not much to live for."  He denies suicidal/homicidial ideations. Emotional support provided. We discussed hope that he will have good support and socialization once moved into [facility].   Therapeutic listening. Answered questions.   **Spoke with grandson, Teddy Spike via telephone. He is on the way from [facility]. He will be in town by 3pm but plans to go to his grandfathers apartment to find paperwork and then meet with [name] staff about ALF room. Thurmond Butts confirms plan to keep his grandfather hospitalized for safety until they can transfer him to ALF.   Discussed diagnoses, interventions, plan of care. Answered questions regarding care plan.   Thurmond Butts confirms he is his grandfather's POA. He confirms his grandfather's wishes for DNR code status. Introduced and discussed MOST form and consideration of further conversation with his grandfather about wishes for IVF, ABX, feeding tube, intubation, and/or re-hospitalization if necessary. Thurmond Butts is interested in discussing and completing MOST form with his grandfather and palliative provider. We plan to meet tomorrow  afternoon when Thurmond Butts is available.     SUMMARY OF RECOMMENDATIONS    DNR  Continue current plan of care and medical management.  Grandson Teddy Spike) is reported HCPOA. Will request documentation for EMR.  Grandson on the way from [place]. He will meet with [name] staff this afternoon to coordinate transfer to ALF bed when patient is medically stable. Patient is eager and hopeful to get an ALF room at [facility].   Grandson interested in MOST form discussion with his grandfather. PMT provider will plan to f/u with them tomorrow afternoon.  Recommend outpatient palliative referral.   Code Status/Advance Care Planning:  DNR  Symptom Management:   Per  attending  Palliative Prophylaxis:   Aspiration, Delirium Protocol, Frequent Pain Assessment, Oral Care and Turn Reposition  Psycho-social/Spiritual:   Desire for further Chaplaincy support: yes  Additional Recommendations: Caregiving  Support/Resources, Compassionate Wean Education and Education on Hospice  Prognosis:   Unable to determine  Discharge Planning:Hopefully ALF with outpatient palliative services     Primary Diagnoses: Present on Admission: . Hypokalemia . Chronic systolic heart failure ([place]) . Hyponatremia, acute on chronic . Status cardiac pacemaker . AF (paroxysmal atrial fibrillation) ([place])   I have reviewed the medical record, interviewed the patient and family, and examined the patient. The following aspects are pertinent.  Past Medical History:  Diagnosis Date  . Achilles tendinitis   . Cardiomyopathy ([place])    a. 01/2019 Echo: EF 25%, glob HK.  . Cataract   . Chronic HFrEF (heart failure with reduced ejection fraction) ([place])    a. 03/2018 Echo: EF 45-50%); b. 01/2019 Echo: EF 25%. Glob HK. Triv AI/PR, Mod TR. RVSP 57.62mmHg.  Marland Kitchen Hearing loss   . Herpes zoster without complication 9/44/9675  . Permanent atrial fibrillation (H[facility])    a. CHA2DS2VASc = 4-->Eliquis.  . Personal history of prostate cancer 10/08/2014   s/p 15 radiation treatments treated by Dr. Madelin Headings   . Prostate cancer ([place]) 03/14/2020  . SSS (sick sinus syndrome) (H[facility])    a. s/p MDT Micra AV leadless PPM (ser # [id number]).  . Varicose vein of leg    Social History   Socioeconomic History  . Marital status: Legally Separated    Spouse name: Not on file  . Number of children: 1  . Years of education: Not on file  . Highest education level: Bachelor's degree (e.g., BA, AB, BS)  Occupational History  . Occupation: retired  Tobacco Use  . Smoking status: Never Smoker  . Smokeless tobacco: Never Used  Vaping Use  . Vaping Use: Never used  Substance and Sexual Activity  .  Alcohol use: Not Currently    Alcohol/week: 0.0 standard drinks  . Drug use: No  . Sexual activity: Not on file  Other Topics Concern  . Not on file  Social History Narrative   Lives along at [place] Strain: Low Risk   . Difficulty of Paying Living Expenses: Not hard at all  Food Insecurity: No Food Insecurity  . Worried About Charity fundraiser in the Last Year: Never true  . Ran Out of Food in the Last Year: Never true  Transportation Needs: No Transportation Needs  . Lack of Transportation (Medical): No  . Lack of Transportation (Non-Medical): No  Physical Activity: Inactive  . Days of Exercise per Week: 0 days  . Minutes of Exercise per Session: 0 min  Stress: No Stress Concern Present  .  Feeling of Stress : Not at all  Social Connections: Socially Isolated  . Frequency of Communication with Friends and Family: Once a week  . Frequency of Social Gatherings with Friends and Family: More than three times a week  . Attends Religious Services: Never  . Active Member of Clubs or Organizations: No  . Attends Banker Meetings: Never  . Marital Status: Separated   Family History  Problem Relation Age of Onset  . Heart disease Mother   . Stroke Mother   . Alcoholism Son    Scheduled Meds: . amitriptyline  25 mg Oral QHS  . vitamin C  1,000 mg Oral Daily  . calcium-vitamin D  1 tablet Oral Daily  . cholecalciferol  2,000 Units Oral Daily  . ferrous sulfate  325 mg Oral Q breakfast  . gabapentin  300 mg Oral Daily  . gabapentin  600 mg Oral QHS  . multivitamin with minerals  1 tablet Oral Daily  . polyethylene glycol  17 g Oral Daily  . pyridOXINE  100 mg Oral Daily  . sodium chloride flush  3 mL Intravenous Q12H  . sodium chloride  1 g Oral BID WC  . tamsulosin  0.4 mg Oral QPC breakfast  . vitamin E  400 Units Oral Daily   Continuous Infusions: . sodium chloride    . cefTRIAXone (ROCEPHIN)  IV Stopped  (04/07/20 1254)   PRN Meds:.sodium chloride, acetaminophen **OR** acetaminophen, acetaminophen, ondansetron **OR** ondansetron (ZOFRAN) IV, sodium chloride flush Medications Prior to Admission:  Prior to Admission medications   Medication Sig Start Date End Date Taking? Authorizing Provider  acetaminophen (TYLENOL) 325 MG tablet Take 2 tablets (650 mg total) by mouth every 6 (six) hours as needed for mild pain. Patient not taking: Reported on 04/06/2020 02/08/20   Lurene Shadow, MD  amitriptyline (ELAVIL) 25 MG tablet Take 25 mg by mouth at bedtime. Patient not taking: Reported on 04/06/2020 05/18/19   [provider]  Ascorbic Acid (VITAMIN C) 1000 MG tablet Take 1,000 mg by mouth daily. Patient not taking: Reported on 04/06/2020    [provider]  Calcium Carbonate-Vitamin D (CALCIUM 500 + D PO) Take 1 tablet by mouth daily. Patient not taking: Reported on 04/06/2020    [provider]  cholecalciferol (VITAMIN D3) 25 MCG (1000 UT) tablet Take 2,000 Units by mouth daily.  Patient not taking: Reported on 04/06/2020    [provider]  ferrous sulfate 325 (65 FE) MG EC tablet Take 325 mg by mouth daily with breakfast. Patient not taking: Reported on 04/06/2020    [provider]  gabapentin (NEURONTIN) 100 MG capsule TAKE 1 CAPSULE IN THE MORNING AND TWO AT NIGHT Patient not taking: Reported on 04/06/2020 04/05/19   Malva Limes, MD  metolazone (ZAROXOLYN) 2.5 MG tablet Take 1 tablet (2.5 mg total) by mouth daily. Patient not taking: Reported on 04/06/2020 03/13/20 06/11/20  Debbe Odea, MD  Multiple Vitamins-Minerals (MULTIVITAMIN ADULT PO) Take 1 tablet by mouth daily. Patient not taking: Reported on 04/06/2020 07/17/08   [provider]  Polyethylene Glycol 3350 (MIRALAX PO) Take 1 Dose by mouth daily. Heaping teaspoon Patient not taking: Reported on 04/06/2020    [provider]  Potassium Chloride ER 20 MEQ TBCR  Take 2 tablets (40 MEQ) by mouth in the AM and in the PM. Patient not taking: Reported on 04/06/2020 03/27/20   Debbe Odea, MD  Psyllium (METAMUCIL PO) Take 1 Dose by mouth daily. Patient not  taking: Reported on 04/06/2020    [provider]  pyridOXINE (VITAMIN B-6) 100 MG tablet Take 100 mg by mouth daily. Patient not taking: Reported on 04/06/2020    [provider]  sodium chloride 1 g tablet Take 1 tablet (1 g total) by mouth 2 (two) times daily with a meal. Patient not taking: Reported on 04/06/2020 02/08/20   Jennye Boroughs, MD  tamsulosin [facility]) 0.4 MG CAPS capsule Take 0.4 mg by mouth daily after breakfast.  Patient not taking: Reported on 04/06/2020    [provider]  torsemide (DEMADEX) 20 MG tablet Take 1 tablet (20 mg) by mouth once daily Patient not taking: Reported on 04/06/2020 04/04/20   Kate Sable, MD  Vitamin E 400 units TABS Take 1 tablet by mouth daily. Patient not taking: Reported on 04/06/2020 07/17/08   [provider]   No Known Allergies Review of Systems  Constitutional: Positive for activity change.  Respiratory: Positive for shortness of breath.    Physical Exam Vitals and nursing note reviewed.  Constitutional:      General: He is awake.     Appearance: He is ill-appearing.  HENT:     Head: Normocephalic and atraumatic.  Pulmonary:     Effort: No tachypnea, accessory muscle usage or respiratory distress.     Comments: Intermittent dyspnea at rest Skin:    General: Skin is warm and dry.  Neurological:     Mental Status: He is alert and oriented to person, place, and time.  Psychiatric:        Mood and Affect: Mood normal.        Speech: Speech normal.        Behavior: Behavior normal.        Cognition and Memory: Cognition normal.    Vital Signs: BP 110/70   Pulse 63   Temp (!) 97.5 F (36.4 C) (Oral)   Resp 14   Ht $R'5\' 7"'iu$  (1.702 m)   Wt 77.1 kg   SpO2 91%   BMI 26.63 kg/m  Pain Scale:  0-10   Pain Score: 0-No pain   SpO2: SpO2: 91 % O2 Device:SpO2: 91 % O2 Flow Rate: .   IO: Intake/output summary:   Intake/Output Summary (Last 24 hours) at 04/07/2020 1659 Last data filed at 04/07/2020 1248 Gross per 24 hour  Intake --  Output 200 ml  Net -200 ml    LBM:   Baseline Weight: Weight: 77.1 kg Most recent weight: Weight: 77.1 kg     Palliative Assessment/Data: PPS 50%   Flowsheet Rows   Flowsheet Row Most Recent Value  Intake Tab   Referral Department Hospitalist  Unit at Time of Referral ER  Palliative Care Primary Diagnosis Cardiac  Palliative Care Type New Palliative care  Reason for referral Clarify Goals of Care  Date first seen by Palliative Care 04/07/20  Clinical Assessment   Palliative Performance Scale Score 50%  Psychosocial & Spiritual Assessment   Palliative Care Outcomes   Patient/Family meeting held? Yes  Who was at the meeting? patient, spoke with grandson via telephone  Palliative Care Outcomes Clarified goals of care, Provided end of life care assistance, Provided psychosocial or spiritual support, Linked to palliative care logitudinal support, ACP counseling assistance     Time Total: 60 Greater than 50%  of this time was spent counseling and coordinating care related to the above assessment and plan.  Signed by:  Ihor Dow, DNP, FNP-C Palliative Medicine Team  Phone: [phone] Fax: [phone]  Please contact Palliative Medicine Team phone at [phone] for questions and concerns.  For individual provider: See Shea Evans

## 2020-04-08 DIAGNOSIS — R0989 Other specified symptoms and signs involving the circulatory and respiratory systems: Secondary | ICD-10-CM | POA: Diagnosis not present

## 2020-04-08 DIAGNOSIS — I6523 Occlusion and stenosis of bilateral carotid arteries: Secondary | ICD-10-CM | POA: Diagnosis not present

## 2020-04-08 LAB — CBC WITH DIFFERENTIAL/PLATELET
Abs Immature Granulocytes: 0.1 10*3/uL — ABNORMAL HIGH (ref 0.00–0.07)
Basophils Absolute: 0.1 10*3/uL (ref 0.0–0.1)
Basophils Relative: 1 %
Eosinophils Absolute: 0.1 10*3/uL (ref 0.0–0.5)
Eosinophils Relative: 1 %
HCT: 37.1 % — ABNORMAL LOW (ref 39.0–52.0)
Hemoglobin: 13.6 g/dL (ref 13.0–17.0)
Immature Granulocytes: 1 %
Lymphocytes Relative: 14 %
Lymphs Abs: 1.8 10*3/uL (ref 0.7–4.0)
MCH: 36.6 pg — ABNORMAL HIGH (ref 26.0–34.0)
MCHC: 36.7 g/dL — ABNORMAL HIGH (ref 30.0–36.0)
MCV: 99.7 fL (ref 80.0–100.0)
Monocytes Absolute: 1.4 10*3/uL — ABNORMAL HIGH (ref 0.1–1.0)
Monocytes Relative: 11 %
Neutro Abs: 9.7 10*3/uL — ABNORMAL HIGH (ref 1.7–7.7)
Neutrophils Relative %: 72 %
Platelets: 220 10*3/uL (ref 150–400)
RBC: 3.72 MIL/uL — ABNORMAL LOW (ref 4.22–5.81)
RDW: 12.8 % (ref 11.5–15.5)
WBC: 13.2 10*3/uL — ABNORMAL HIGH (ref 4.0–10.5)
nRBC: 0 % (ref 0.0–0.2)

## 2020-04-08 LAB — MAGNESIUM: Magnesium: 2.4 mg/dL (ref 1.7–2.4)

## 2020-04-08 LAB — BASIC METABOLIC PANEL
Anion gap: 12 (ref 5–15)
BUN: 42 mg/dL — ABNORMAL HIGH (ref 8–23)
CO2: 32 mmol/L (ref 22–32)
Calcium: 9.3 mg/dL (ref 8.9–10.3)
Chloride: 82 mmol/L — ABNORMAL LOW (ref 98–111)
Creatinine, Ser: 0.92 mg/dL (ref 0.61–1.24)
GFR, Estimated: >60 mL/min (ref >60)
Glucose, Bld: 112 mg/dL — ABNORMAL HIGH (ref 70–99)
Potassium: 3.3 mmol/L — ABNORMAL LOW (ref 3.5–5.1)
Sodium: 126 mmol/L — ABNORMAL LOW (ref 135–145)

## 2020-04-08 LAB — SODIUM: Sodium: 125 mmol/L — ABNORMAL LOW (ref 135–145)

## 2020-04-08 LAB — T3, FREE: T3, Free: 1.8 pg/mL — ABNORMAL LOW (ref 2.0–4.4)

## 2020-04-08 MED ORDER — SODIUM CHLORIDE 0.9 % IV SOLN: INTRAVENOUS | Status: DC

## 2020-04-08 MED ORDER — POTASSIUM CHLORIDE CRYS ER 20 MEQ PO TBCR
40.0000 meq | EXTENDED_RELEASE_TABLET | ORAL | Status: AC
  Administered 2020-04-08 (×2): 40 meq via ORAL
  Filled 2020-04-08 (×2): qty 2

## 2020-04-08 NOTE — Progress Notes (Signed)
PROGRESS NOTE    Douglas Calhoun  BCW:[id number] DOB: [birth year] DOA: 04/06/2020 PCP: Birdie Sons, MD   Brief Narrative:  Douglas Calhoun is a 86 y.o. male with medical history significant for atrial fibrillation on Eliquis, discontinued on 02/01/2020 by cardiologist due to recent falls, systolic heart failure with last EF 25% on 02/03/2019, sick sinus syndrome status post pacemaker on 9/20 who was brought into the emergency room by EMS for evaluation of mental status changes.  Per family they told patient appeared more confused than his baseline.  Family reported to EMS that he had stopped taking his medications 2 days prior to his hospitalization because he no longer wishes to take them and would like to go be with the [place].  Patient lives in retirement home.  He has a staff from Monday through Friday.  No other complaint.  Labs reveal sodium 122, potassium less than 2, chloride 70, bicarb 34, glucose 124, BUN 64, creatinine 1.25, calcium 9.4, magnesium 2.5, total CK 83, white count 9.2, hemoglobin 14.1, hematocrit 38.8, MCV 97.5, RDW 12.2, platelet count 233 Respiratory viral panel is negative.  CT head unremarkable.  Assessment & Plan:   Principal Problem:   Hypokalemia Active Problems:   Chronic systolic heart failure (HCC)   Status cardiac pacemaker   Hyponatremia, acute on chronic   Generalized weakness   AF (paroxysmal atrial fibrillation) (HCC)   Hypokalemia: 3.3 again today.  Will replace.  Recheck in the morning.  Generalized weakness/frequent falls: Due to combination of aging, deconditioning and multiple electrolyte abnormalities.  Assessed by PT OT and they recommend home health PT.  Acute metabolic encephalopathy: Patient alert and oriented.  Back to his baseline.  Acute on chronic hyponatremia: All work-up is negative for SIADH.  He may be having hypovolemic hyponatremia.  Sodium improved from presentation however remains a stable around 125.  We will continue  sodium tablets.  We will also start on normal saline at 75 cc/h and monitor sodium every 6 hours.  Chronic systolic heart failure ([place]), last EF 25% 10/20: Fairly compensated.  Holding diuretic therapy for now.  Paroxysmal atrial fibrillation: Currently off all meds per review of cardiology consult note from 10/8 Not on chronic anticoagulation due to history of frequent falls.  Rates controlled.  Status cardiac pacemaker No acute disease suspected  BPH Continue Flomax  Subclinical hypothyroidism: Slightly elevated TSH and slightly limited free T3 but normal T4.  No indication of treatment.  UTI?  UA indicative of UTI in a male patient.  Patient was unable to provide any history due to encephalopathy.  For some reason, urine culture was not sent.  Started treating with Rocephin yesterday empirically.  We will continue course of antibiotics.  Aging/multiple medical problems: Consulted palliative care to have goal of care conversation with patient.  DVT prophylaxis:    Code Status: DNR  Family Communication: None present at bedside.  Called and updated grandson over the phone.  Status is: Inpatient  Remains inpatient appropriate because:Inpatient level of care appropriate due to severity of illness   Dispo: The patient is from: Douglas Calhoun              Anticipated d/c is to: Douglas Calhoun              Anticipated d/c date is: 1 day              Patient currently is not medically stable to d/c.        Estimated body mass  index is 26.63 kg/m as calculated from the following:   Height as of this encounter: 5\' 7"  (1.702 m).   Weight as of this encounter: 77.1 kg.      Nutritional status:               Consultants:   None  Procedures:   None  Antimicrobials:  Anti-infectives (From admission, onward)   Start     Dose/Rate Route Frequency Ordered Stop   04/07/20 1100  cefTRIAXone (ROCEPHIN) 1 g in sodium chloride 0.9 % 100 mL IVPB        1 g 200 mL/hr over 30 Minutes  Intravenous Every 24 hours 04/07/20 1054           Subjective: Patient seen and examined.  He is much more alert and makes sense in conversation today.  Does not have any complaint.  Objective: Vitals:   04/08/20 0016 04/08/20 0423 04/08/20 0818 04/08/20 1122  BP: 125/89 (!) 91/39 92/61 (!) 101/59  Pulse: (!) 58 77 70 (!) 58  Resp: 16 14 16 17   Temp: 97.6 F (36.4 C) 98.4 F (36.9 C) 98.5 F (36.9 C) 98.6 F (37 C)  TempSrc: Oral   Oral  SpO2: 98% 93% 92% 91%  Weight:      Height:        Intake/Output Summary (Last 24 hours) at 04/08/2020 1326 Last data filed at 04/08/2020 1050 Gross per 24 hour  Intake 240 ml  Output --  Net 240 ml   Filed Weights   04/06/20 1240  Weight: 77.1 kg    Examination:  General exam: Appears calm and comfortable  Respiratory system: Clear to auscultation. Respiratory effort normal. Cardiovascular system: S1 & S2 heard, RRR. No JVD, murmurs, rubs, gallops or clicks. No pedal edema. Gastrointestinal system: Abdomen is nondistended, soft and nontender. No organomegaly or masses felt. Normal bowel sounds heard. Central nervous system: Alert and oriented x3. No focal neurological deficits. Extremities: Symmetric 5 x 5 power. Skin: No rashes, lesions or ulcers.  Psychiatry: Judgement and insight appear poor. Mood & affect appropriate.   Data Reviewed: I have personally reviewed following labs and imaging studies  CBC: Recent Labs  Lab 04/06/20 1416 04/07/20 0422 04/08/20 0448  WBC 9.2 9.5 13.2*  NEUTROABS  --   --  9.7*  HGB 14.1 13.4 13.6  HCT 38.8* 37.0* 37.1*  MCV 97.5 98.7 99.7  PLT 233 238 353   Basic Metabolic Panel: Recent Labs  Lab 04/03/20 1603 04/06/20 1416 04/07/20 0422 04/07/20 1533 04/07/20 2216 04/08/20 0448 04/08/20 1004  NA 122* 122* 125* 125* 128* 126* 125*  K 2.8* <2.0* 3.0* 4.0  --  3.3*  --   CL 71* 70* 78*  --   --  82*  --   CO2 37* 34* 33*  --   --  32  --   GLUCOSE 125* 124* 112*  --   --  112*   --   BUN 61* 64* 57*  --   --  42*  --   CREATININE 1.53* 1.25* 1.18  --   --  0.92  --   CALCIUM 9.8 9.4 9.4  --   --  9.3  --   MG  --  2.5*  --   --   --  2.4  --    GFR: Estimated Creatinine Clearance: 41.9 mL/min (by C-G formula based on SCr of 0.92 mg/dL). Liver Function Tests: No results for input(s): AST, ALT, ALKPHOS, BILITOT,  PROT, ALBUMIN in the last 168 hours. No results for input(s): LIPASE, AMYLASE in the last 168 hours. No results for input(s): AMMONIA in the last 168 hours. Coagulation Profile: No results for input(s): INR, PROTIME in the last 168 hours. Cardiac Enzymes: Recent Labs  Lab 04/06/20 1416  CKTOTAL 83   BNP (last 3 results) No results for input(s): PROBNP in the last 8760 hours. HbA1C: No results for input(s): HGBA1C in the last 72 hours. CBG: No results for input(s): GLUCAP in the last 168 hours. Lipid Profile: No results for input(s): CHOL, HDL, LDLCALC, TRIG, CHOLHDL, LDLDIRECT in the last 72 hours. Thyroid Function Tests: Recent Labs    04/06/20 1416 04/07/20 1103  TSH 7.167*  --   FREET4  --  1.01  T3FREE  --  1.8*   Anemia Panel: No results for input(s): VITAMINB12, FOLATE, FERRITIN, TIBC, IRON, RETICCTPCT in the last 72 hours. Sepsis Labs: No results for input(s): PROCALCITON, LATICACIDVEN in the last 168 hours.  Recent Results (from the past 240 hour(s))  Resp Panel by RT-PCR (Flu A&B, Covid) Nasopharyngeal Swab     Status: None   Collection Time: 04/06/20  2:16 PM   Specimen: Nasopharyngeal Swab; Nasopharyngeal(NP) swabs in vial transport medium  Result Value Ref Range Status   SARS Coronavirus 2 by RT PCR NEGATIVE NEGATIVE Final    Comment: (NOTE) SARS-CoV-2 target nucleic acids are NOT DETECTED.  The SARS-CoV-2 RNA is generally detectable in upper respiratory specimens during the acute phase of infection. The lowest concentration of SARS-CoV-2 viral copies this assay can detect is 138 copies/mL. A negative result does not  preclude SARS-Cov-2 infection and should not be used as the sole basis for treatment or other patient management decisions. A negative result may occur with  improper specimen collection/handling, submission of specimen other than nasopharyngeal swab, presence of viral mutation(s) within the areas targeted by this assay, and inadequate number of viral copies(<138 copies/mL). A negative result must be combined with clinical observations, patient history, and epidemiological information. The expected result is Negative.  Fact Sheet for Patients:  EntrepreneurPulse.com.au  Fact Sheet for Healthcare Providers:  IncredibleEmployment.be  This test is no t yet approved or cleared by the [place] FDA and  has been authorized for detection and/or diagnosis of SARS-CoV-2 by FDA under an Emergency Use Authorization (EUA). This EUA will remain  in effect (meaning this test can be used) for the duration of the COVID-19 declaration under Section 564(b)(1) of the Act, 21 U.S.C.section 360bbb-3(b)(1), unless the authorization is terminated  or revoked sooner.       Influenza A by PCR NEGATIVE NEGATIVE Final   Influenza B by PCR NEGATIVE NEGATIVE Final    Comment: (NOTE) The Xpert Xpress SARS-CoV-2/FLU/RSV plus assay is intended as an aid in the diagnosis of influenza from Nasopharyngeal swab specimens and should not be used as a sole basis for treatment. Nasal washings and aspirates are unacceptable for Xpert Xpress SARS-CoV-2/FLU/RSV testing.  Fact Sheet for Patients: EntrepreneurPulse.com.au  Fact Sheet for Healthcare Providers: IncredibleEmployment.be  This test is not yet approved or cleared by the [place] FDA and has been authorized for detection and/or diagnosis of SARS-CoV-2 by FDA under an Emergency Use Authorization (EUA). This EUA will remain in effect (meaning this test can be used) for the  duration of the COVID-19 declaration under Section 564(b)(1) of the Act, 21 U.S.C. section 360bbb-3(b)(1), unless the authorization is terminated or revoked.  Performed at [facility], [address] [place],  [place] [address]       Radiology Studies: CT Head Wo Contrast  Result Date: 04/06/2020 CLINICAL DATA:  86 year old male with history of mental status change. EXAM: CT HEAD WITHOUT CONTRAST TECHNIQUE: Contiguous axial images were obtained from the base of the skull through the vertex without intravenous contrast. COMPARISON:  No priors. FINDINGS: Brain: Mild to moderate cerebral and mild cerebellar atrophy. Patchy and confluent areas of decreased attenuation are noted throughout the deep and periventricular white matter of the cerebral hemispheres bilaterally, compatible with chronic microvascular ischemic disease. No evidence of acute infarction, hemorrhage, hydrocephalus, extra-axial collection or mass lesion/mass effect. Vascular: No hyperdense vessel or unexpected calcification. Skull: Normal. Negative for fracture or focal lesion. Sinuses/Orbits: No acute finding. Other: None. IMPRESSION: 1. No acute intracranial abnormalities. 2. Mild-to-moderate cerebral and cerebellar atrophy with extensive chronic microvascular ischemic changes in the cerebral white matter, as above. Electronically Signed   By: Vinnie Langton M.D.   On: 04/06/2020 15:35   DG Chest Port 1 View  Result Date: 04/06/2020 CLINICAL DATA:  Shortness of breath. EXAM: PORTABLE CHEST 1 VIEW COMPARISON:  March 14, 2020. FINDINGS: The heart size and mediastinal contours are within normal limits. No pneumothorax is noted. Minimal right basilar subsegmental atelectasis or scarring is noted. Increased left basilar atelectasis or infiltrate is noted. No pneumothorax or significant pleural effusion is noted. The visualized skeletal structures are unremarkable. IMPRESSION: Minimal right basilar subsegmental  atelectasis or scarring. Increased left basilar atelectasis or infiltrate is noted. Electronically Signed   By: Marijo Conception M.D.   On: 04/06/2020 16:12    Scheduled Meds: . amitriptyline  25 mg Oral QHS  . vitamin C  1,000 mg Oral Daily  . calcium-vitamin D  1 tablet Oral Daily  . cholecalciferol  2,000 Units Oral Daily  . ferrous sulfate  325 mg Oral Q breakfast  . gabapentin  300 mg Oral Daily  . gabapentin  600 mg Oral QHS  . multivitamin with minerals  1 tablet Oral Daily  . polyethylene glycol  17 g Oral Daily  . potassium chloride  40 mEq Oral Q4H  . pyridOXINE  100 mg Oral Daily  . sodium chloride flush  3 mL Intravenous Q12H  . sodium chloride  1 g Oral BID WC  . tamsulosin  0.4 mg Oral QPC breakfast  . vitamin E  400 Units Oral Daily   Continuous Infusions: . sodium chloride    . sodium chloride    . cefTRIAXone (ROCEPHIN)  IV Stopped (04/07/20 1254)     LOS: 2 days   Time spent: 30 minutes   Darliss Cheney, MD [facility]  04/08/2020, 1:26 PM   To contact the attending provider between 7A-7P or the covering provider during after hours 7P-7A, please log into the web site [email or web address].

## 2020-04-08 NOTE — Progress Notes (Signed)
Daily Progress Note   Patient Name: Douglas Calhoun       Date: 04/08/2020 DOB: 1920/01/06  Age: 86 y.o. MRN#: [id number] Attending Physician: Darliss Cheney, MD Primary Care Physician: Birdie Sons, MD Admit Date: 04/06/2020  Reason for Consultation/Follow-up: Establishing goals of care  Subjective: Patient awake, alert, oriented and in good spirits this afternoon. No complaints. Waiting on [facility] staff to evaluate him this afternoon.  GOC:  Barkley Bruns at bedside.   Reviewed course of hospitalization including diagnoses, interventions, plan of care.   [name] confirms plan for discharge to ALF room at [facility]. Staff is coming to evaluate him today. Patient cannot return to his apartment. It is not safe with frequent falls. Patient is hopeful and eager to get a room at [facility].   Thurmond Butts confirms he is documented HCPOA. Thurmond Butts provides patient's living will/HCPOA paperwork. Copy placed in paper chart to be scanned into EMR.  Patient is very clear that he is ready to go when his time comes. He has lived a full life. Traveled the world. Thurmond Butts shares that they went sky diving when Mr. Accardo was 86yo!   [name] and Mr. Cumpston are ready to complete MOST form this afternoon. Patient's decisions include: DNR/DNI, re-hospitalization if necessary for medical management, IVF/ABX for time trial if indicated, and NO feeding tube. Durable DNR previously completed. Electronic [employer] MOST form completed. Thurmond Butts understands and respects his grandfather's wishes. Copies placed in chart and given to [facility].   Discussed outpatient palliative referral. Patient and grandson agreeable. Explained to grandson that outpatient palliative can transition to hospice services at any point, when his grandfather gets sick  again and especially if he does not desire re-hospitalization. Answered questions. Hard Choices booklet and PMT contact information given.     Length of Stay: 2  Current Medications: Scheduled Meds:  . amitriptyline  25 mg Oral QHS  . vitamin C  1,000 mg Oral Daily  . calcium-vitamin D  1 tablet Oral Daily  . cholecalciferol  2,000 Units Oral Daily  . ferrous sulfate  325 mg Oral Q breakfast  . gabapentin  300 mg Oral Daily  . gabapentin  600 mg Oral QHS  . multivitamin with minerals  1 tablet Oral Daily  . polyethylene glycol  17 g Oral Daily  . potassium  chloride  40 mEq Oral Q4H  . pyridOXINE  100 mg Oral Daily  . sodium chloride flush  3 mL Intravenous Q12H  . sodium chloride  1 g Oral BID WC  . tamsulosin  0.4 mg Oral QPC breakfast  . vitamin E  400 Units Oral Daily    Continuous Infusions: . sodium chloride    . sodium chloride 75 mL/hr at 04/08/20 1357  . cefTRIAXone (ROCEPHIN)  IV Stopped (04/07/20 1254)    PRN Meds: sodium chloride, acetaminophen **OR** acetaminophen, acetaminophen, ondansetron **OR** ondansetron (ZOFRAN) IV, sodium chloride flush  Physical Exam Vitals and nursing note reviewed.  Constitutional:      General: He is awake.  HENT:     Head: Normocephalic and atraumatic.  Pulmonary:     Effort: No tachypnea, accessory muscle usage or respiratory distress.  Skin:    General: Skin is warm and dry.  Neurological:     Mental Status: He is alert and oriented to person, place, and time.  Psychiatric:        Mood and Affect: Mood normal.        Speech: Speech normal.        Behavior: Behavior normal.        Cognition and Memory: Cognition normal.            Vital Signs: BP (!) 101/59 (BP Location: Left Arm)   Pulse (!) 58   Temp 98.6 F (37 C) (Oral)   Resp 17   Ht 5\' 7"  (1.702 m)   Wt 77.1 kg   SpO2 91%   BMI 26.63 kg/m  SpO2: SpO2: 91 % O2 Device: O2 Device: Room Air O2 Flow Rate:    Intake/output summary:   Intake/Output Summary  (Last 24 hours) at 04/08/2020 1408 Last data filed at 04/08/2020 1050 Gross per 24 hour  Intake 240 ml  Output --  Net 240 ml   LBM:   Baseline Weight: Weight: 77.1 kg Most recent weight: Weight: 77.1 kg       Palliative Assessment/Data: PPS 50%    Flowsheet Rows   Flowsheet Row Most Recent Value  Intake Tab   Referral Department Hospitalist  Unit at Time of Referral ER  Palliative Care Primary Diagnosis Cardiac  Palliative Care Type New Palliative care  Reason for referral Clarify Goals of Care  Date first seen by Palliative Care 04/07/20  Clinical Assessment   Palliative Performance Scale Score 50%  Psychosocial & Spiritual Assessment   Palliative Care Outcomes   Patient/Family meeting held? Yes  Who was at the meeting? patient, spoke with grandson via telephone  Palliative Care Outcomes Clarified goals of care, Provided end of life care assistance, Provided psychosocial or spiritual support, Linked to palliative care logitudinal support, ACP counseling assistance      Patient Active Problem List   Diagnosis Date Noted  . Palliative care by specialist   . Goals of care, counseling/discussion   . DNR (do not resuscitate)   . AF (paroxysmal atrial fibrillation) ([place]) 04/06/2020  . Coagulation disorder ([place]) 03/14/2020  . Status cardiac pacemaker 02/02/2020  . Hyponatremia, acute on chronic 02/02/2020  . Hypokalemia 02/02/2020  . Generalized weakness 02/02/2020  . CAP (community acquired pneumonia) 02/02/2020  . Falls 02/02/2020  . Elevated troponin 02/02/2020  . Hypoxia 02/02/2020  . Compression fracture of lumbar spine, non-traumatic ([place]) 03/27/2019  . Hyponatremia with excess extracellular fluid volume 03/02/2019  . SOBOE (shortness of breath on exertion) 01/19/2019  . Sick sinus syndrome ([place])  01/11/2019  . Chronic systolic heart failure ([place]) 04/17/2018  . Permanent atrial fibrillation ([place]) 04/17/2018  . Acute exacerbation of CHF (congestive heart  failure) ([place]) 03/29/2018  . Strain of rotator cuff capsule 11/07/2017  . Edema 10/09/2014  . Anemia 10/08/2014  . PVC (premature ventricular contraction) 10/08/2014  . Urinary incontinence 10/08/2014  . Varicose veins of legs 10/08/2014  . Venous insufficiency 10/08/2014  . Lipoma of skin, face 06/24/2008    Palliative Care Assessment & Plan   Patient Profile: 87 y.o. male  with past medical history of systolic heart failure with EF 25%, sick sinus syndrome s/p pacemaker, atrial fibrillation on Eliquis but recently discontinued by cardiology due to recent falls admitted on 04/06/2020 with mental status changes. Per family, patient stopped taking medications 2 days prior to hospitalization because he no longer wished to take them and wanted to go be with the [place]. Hospital admission while he waits on ALF bed. Patient with hypokalemia, acute on chronic hyponatremia, chronic systolic heart failure. Palliative medicine consultation for goals of care.   Assessment: Hypokalemia Acute on chronic hyponatremia Generalized weakness/frequent falls Chronic systolic heart failure EF 25% Paroxysmal atrial fibrillation SSS s/p pacemaker BPH  Recommendations/Plan:  Continue medical management inpatient.   Patient waiting on evaluation for ALF bed at [facility]. Grandson coordinating with [employer].  Patient has a documented living will/HCPOA paperwork with grandson Avir Deruiter) as documented HCPOA. Copy placed in chart to be scanned into EMR.  MOST form completed with patient and grandson. Patient decisions include: DNR/DNI, limited additional interventions including re-hospitalization for medical management if necessary, IVF/ABX for time trial if necessary, NO feeding tube. Electronic [employer] MOST completed. Durable DNR previously completed.  Outpatient palliative referral at ALF. Notified TOC team.   Code Status: DNR/DNI   Code Status Orders  (From admission, onward)          Start     Ordered   04/06/20 1747  Do not attempt resuscitation (DNR)  Continuous       Question Answer Comment  In the event of cardiac or respiratory ARREST Do not call a "code blue"   In the event of cardiac or respiratory ARREST Do not perform Intubation, CPR, defibrillation or ACLS   In the event of cardiac or respiratory ARREST Use medication by any route, position, wound care, and other measures to relive pain and suffering. May use oxygen, suction and manual treatment of airway obstruction as needed for comfort.   Comments Discussed CODE STATUS with patient who wishes to be placed on a DO NOT RESUSCITATE status      04/06/20 1746        Code Status History    Date Active Date Inactive Code Status Order ID Comments User Context   04/06/2020 1703 04/06/2020 1746 DNR [id number]  Collier Bullock, MD ED   02/06/2020 1358 02/08/2020 1943 DNR [id number]  Jennye Boroughs, MD Inpatient   02/02/2020 2154 02/06/2020 1358 Full Code [id number]  Athena Masse, MD ED   01/11/2019 0917 01/12/2019 1337 Full Code [id number]  Isaias Cowman, MD Inpatient   03/30/2018 0123 03/31/2018 1805 Full Code [id number]  Amelia Jo, MD Inpatient   Advance Care Planning Activity    Advance Directive Documentation   Flowsheet Row Most Recent Value  Type of Advance Directive Out of facility DNR (pink MOST or yellow form)  Pre-existing out of facility DNR order (yellow form or pink MOST form) Yellow form placed in chart (order not valid for inpatient use)  "  MOST" Form in Place? --       Prognosis:   Unable to determine  Discharge Planning:  ALF with outpatient palliative services  Care plan was discussed with patient, grandson Teddy Spike), RN, [facility]  Thank you for allowing the Palliative Medicine Team to assist in the care of this patient.   Total Time 40 Prolonged Time Billed  no      Greater than 50%  of this time was spent counseling and coordinating care related to the above assessment and  plan.  Ihor Dow, DNP, FNP-C Palliative Medicine Team  Phone: [phone] Fax: [phone]  Please contact Palliative Medicine Team phone at [phone] for questions and concerns.

## 2020-04-08 NOTE — Progress Notes (Signed)
[facility]) Hospital Liaison RN note:  Received new referral for [facility] outpatient palliative program to follow post discharge from Dr. Doristine Bosworth and Jhonnie Garner, TOC. Patient information given to referral. Plan is for discharge tomorrow back to [facility]. Will follow for disposition.  Thank you for this referral.  Zandra Abts, RN [facility] Liaison [phone]

## 2020-04-08 NOTE — Telephone Encounter (Signed)
Patient currently admitted to [facility] .  Appt. Below cancelled per friend on dpr   Please call to discuss poc .

## 2020-04-09 LAB — CBC WITH DIFFERENTIAL/PLATELET
Abs Immature Granulocytes: 0.08 10*3/uL — ABNORMAL HIGH (ref 0.00–0.07)
Basophils Absolute: 0.1 10*3/uL (ref 0.0–0.1)
Basophils Relative: 1 %
Eosinophils Absolute: 0.2 10*3/uL (ref 0.0–0.5)
Eosinophils Relative: 2 %
HCT: 34.9 % — ABNORMAL LOW (ref 39.0–52.0)
Hemoglobin: 12.4 g/dL — ABNORMAL LOW (ref 13.0–17.0)
Immature Granulocytes: 1 %
Lymphocytes Relative: 17 %
Lymphs Abs: 1.7 10*3/uL (ref 0.7–4.0)
MCH: 35.9 pg — ABNORMAL HIGH (ref 26.0–34.0)
MCHC: 35.5 g/dL (ref 30.0–36.0)
MCV: 101.2 fL — ABNORMAL HIGH (ref 80.0–100.0)
Monocytes Absolute: 1.1 10*3/uL — ABNORMAL HIGH (ref 0.1–1.0)
Monocytes Relative: 10 %
Neutro Abs: 7.2 10*3/uL (ref 1.7–7.7)
Neutrophils Relative %: 69 %
Platelets: 193 10*3/uL (ref 150–400)
RBC: 3.45 MIL/uL — ABNORMAL LOW (ref 4.22–5.81)
RDW: 12.9 % (ref 11.5–15.5)
WBC: 10.3 10*3/uL (ref 4.0–10.5)
nRBC: 0 % (ref 0.0–0.2)

## 2020-04-09 LAB — BASIC METABOLIC PANEL
Anion gap: 10 (ref 5–15)
BUN: 35 mg/dL — ABNORMAL HIGH (ref 8–23)
CO2: 28 mmol/L (ref 22–32)
Calcium: 8.6 mg/dL — ABNORMAL LOW (ref 8.9–10.3)
Chloride: 89 mmol/L — ABNORMAL LOW (ref 98–111)
Creatinine, Ser: 0.92 mg/dL (ref 0.61–1.24)
GFR, Estimated: >60 mL/min (ref >60)
Glucose, Bld: 109 mg/dL — ABNORMAL HIGH (ref 70–99)
Potassium: 3.8 mmol/L (ref 3.5–5.1)
Sodium: 127 mmol/L — ABNORMAL LOW (ref 135–145)

## 2020-04-09 NOTE — Progress Notes (Signed)
Physical Therapy Treatment Patient Details Name: Douglas Calhoun MRN: [id number] DOB: [birth year] Today's Date: 04/09/2020    History of Present Illness Douglas Calhoun is a 86 y.o. male with medical history significant for atrial fibrillation on Eliquis, discontinued on 02/01/2020 by cardiologist due to recent falls, systolic heart failure with last EF 25% on 02/03/2019, sick sinus syndrome status post pacemaker on 9/20 who was brought into the emergency room by EMS for evaluation of mental status changes.  Per family they told patient appeared more confused than his baseline.  Family reported to EMS that he had stopped taking his medications 2 days prior to his hospitalization because he no longer wishes to take them and would like to go be with the [facility].    PT Comments    Patient received in bed, slumped down attempting to eat lunch. Spaghetti on his chest. Requesting milk. Patient reports he spilled water on his bed. I suggested I assist him up to recliner to finish his lunch there. Patient agreeable. He is mod independent with bed mobility. Transfers with min guard/assist. He ambulated 30 feet in room with RW and min guard. Patient set up in recliner with alarm set, brief and gown changed, lunch tray within reach. He will continue to benefit from skilled PT while here to improve mobility and independence.     Follow Up Recommendations  Home health PT     Equipment Recommendations  None recommended by PT    Recommendations for Other Services       Precautions / Restrictions Precautions Precautions: Fall Restrictions Weight Bearing Restrictions: No    Mobility  Bed Mobility Overal bed mobility: Modified Independent             General bed mobility comments: use of bed rail, no physical assist  Transfers Overall transfer level: Needs assistance Equipment used: Rolling walker (2 wheeled) Transfers: Sit to/from Stand Sit to Stand: Min guard             Ambulation/Gait Ambulation/Gait assistance: Min guard Gait Distance (Feet): 30 Feet Assistive device: Rolling walker (2 wheeled) Gait Pattern/deviations: Step-through pattern;Shuffle;Decreased step length - right;Decreased step length - left;Trunk flexed Gait velocity: decr   General Gait Details: patient ambulates with rw, no unsteadiness noted, shuffle gait.   Stairs             Wheelchair Mobility    Modified Rankin (Stroke Patients Only)       Balance Overall balance assessment: Modified Independent                                          Cognition Arousal/Alertness: Awake/alert Behavior During Therapy: WFL for tasks assessed/performed Overall Cognitive Status: Within Functional Limits for tasks assessed                                        Exercises      General Comments        Pertinent Vitals/Pain Pain Assessment: No/denies pain    Home Living                      Prior Function            PT Goals (current goals can now be found in the care plan section) Acute Rehab  PT Goals Patient Stated Goal: to go to [facility] PT Goal Formulation: With patient Time For Goal Achievement: 04/16/20 Potential to Achieve Goals: Good Progress towards PT goals: Progressing toward goals    Frequency    Min 2X/week      PT Plan Current plan remains appropriate    Co-evaluation              AM-PAC PT "6 Clicks" Mobility   Outcome Measure  Help needed turning from your back to your side while in a flat bed without using bedrails?: None Help needed moving from lying on your back to sitting on the side of a flat bed without using bedrails?: None Help needed moving to and from a bed to a chair (including a wheelchair)?: A Little Help needed standing up from a chair using your arms (e.g., wheelchair or bedside chair)?: A Little Help needed to walk in hospital room?: A Little Help needed climbing 3-5  steps with a railing? : A Lot 6 Click Score: 19    End of Session Equipment Utilized During Treatment: Gait belt Activity Tolerance: Patient tolerated treatment well Patient left: in chair;with call bell/phone within reach;with chair alarm set Nurse Communication: Mobility status PT Visit Diagnosis: Muscle weakness (generalized) (M62.81);Difficulty in walking, not elsewhere classified (R26.2)     Time: 1350-1410 PT Time Calculation (min) (ACUTE ONLY): 20 min  Charges:  $Gait Training: 8-22 mins                     Quinlyn Tep, PT, GCS 04/09/20,2:30 PM

## 2020-04-09 NOTE — TOC Progression Note (Signed)
Transition of Care [facility]) - Progression Note    Patient Details  Name: Douglas Calhoun MRN: [id number] Date of Birth: 04/06/1920  Transition of Care [facility]) CM/SW Contact  Shelbie Ammons, RN Phone Number: 04/09/2020, 2:33 PM  Clinical Narrative:   RNCM communicated with patient's grandson Teddy Spike who is working on completing paperwork and other necessary items for patient to discharge to [facility] tomorrow.  RNCM reached out again to Ed Weeks with [name], prior to patient discharge he will need patient's completed FL-2, Progress notes, H&P, D/C summary along with signed hard copies of orders in the chart. All of this information will need to be faxed to his attention at [phone] prior to patient's discharge. Ed's contact number is [phone].     Expected Discharge Plan: [place] Barriers to Discharge: Continued Medical Work up  Expected Discharge Plan and Services Expected Discharge Plan: [place] In-house Referral: Clinical Social Work     Living arrangements for the past 2 months: Apartment                                       Social Determinants of Health (SDOH) Interventions    Readmission Risk Interventions No flowsheet data found.

## 2020-04-09 NOTE — Telephone Encounter (Signed)
Patient is currently admitted into the hospital. Unable to contact patient to advise of message below.

## 2020-04-09 NOTE — Progress Notes (Signed)
PROGRESS NOTE    Douglas Calhoun  MVH:[id number] DOB: 31-Dec-1919 DOA: 04/06/2020 PCP: Birdie Sons, MD  Brief Narrative:  Douglas Calhoun a 86 y.o.malewith medical history significant foratrial fibrillation on Eliquis, discontinued on 02/01/2020 by cardiologist due to recent falls, systolic heart failure with last EF 25% on 02/03/2019, sick sinus syndrome status post pacemaker on 9/20who was brought into the emergency room by EMS for evaluation of mental status changes. Per family they told patient appeared more confused than his baseline. Family reported to EMS that he had stopped taking his medications 2 days prior to his hospitalization because he no longer wishes to take them and would like to go be with the [place].  Patient lives in [facility] home.  He has a staff from Monday through Friday.  No other complaint.  12/15: No acute status changes overnight.  Patient's been relatively stable over the course of last few days.  Per case management patient will have bed available at assisted living 04/10/2020.   Assessment & Plan:   Principal Problem:   Hypokalemia Active Problems:   Chronic systolic heart failure (HCC)   Status cardiac pacemaker   Hyponatremia, acute on chronic   Generalized weakness   AF (paroxysmal atrial fibrillation) (HCC)  Hypokalemia Resolved after replacement Recheck in a.m.  Generalized weakness/frequent falls:  Due to combination of aging, deconditioning and multiple electrolyte abnormalities.   Assessed by PT OT and they recommend home health PT. Will discharge to assisted living facility with home health physical therapy tomorrow  Acute metabolic encephalopathy:  Patient alert and oriented.  Back to his baseline.  Acute on chronic hyponatremia:  All work-up is negative for SIADH.  He may be having hypovolemic hyponatremia.   Sodium improved from presentation however remains a stable around 125. Plan: DC IV fluids Continue salt tabs    Chronic systolic heart failure ([place]) last EF 25% 10/20: Fairly compensated.   Holding diuretic therapy for now.  Paroxysmal atrial fibrillation Currently off all meds per review of cardiology consult note from 10/8 Not on chronic anticoagulation due to history of frequent falls.   Rates controlled.  Status cardiac pacemaker No acute disease suspected  BPH Continue Flomax  Subclinical hypothyroidism:  Slightly elevated TSH and slightly limited free T3 but normal T4.   No indication for treatment.  UTI?   UA indicative of UTI in a male patient.   Patient was unable to provide any history due to encephalopathy.   For some reason, urine culture was not sent.   Started treating with Rocephin 12/13empirically.   We will continue course of antibiotics.   Aging/multiple medical problems:  Consulted palliative care to have goal of care conversation with patient. Recommendations appreciated Patient DNR   DVT prophylaxis: SCD Code Status: DNR Family Communication: None today Disposition Plan: Status is: Inpatient  Remains inpatient appropriate because:Unsafe d/c plan   Dispo: The patient is from: ALF              Anticipated d/c is to: ALF              Anticipated d/c date is: 1 day              Patient currently is medically stable to d/c.    Patient currently medically stable for discharge.  Pending bed availability at assisted living.  Anticipated date of discharge 04/10/2020.     Consultants:   None  Procedures:   None  Antimicrobials:   Ceftriaxone   Subjective:  Seen and examined.  No distress.  No complaints  Objective: Vitals:   04/09/20 0032 04/09/20 0450 04/09/20 0827 04/09/20 1208  BP: 109/77 (!) 115/57 (!) 99/53 (!) 102/52  Pulse: 83 73 (!) 59 61  Resp: 16 18 18 18   Temp: 98.3 F (36.8 C) (!) 97.5 F (36.4 C) 98.1 F (36.7 C) (!) 97.1 F (36.2 C)  TempSrc:      SpO2: 92% 95% 90% 96%  Weight:      Height:         Intake/Output Summary (Last 24 hours) at 04/09/2020 1254 Last data filed at 04/08/2020 1539 Gross per 24 hour  Intake 269.21 ml  Output --  Net 269.21 ml   Filed Weights   04/06/20 1240  Weight: 77.1 kg    Examination:  General exam: Appears calm and comfortable  Respiratory system: Clear to auscultation. Respiratory effort normal. Cardiovascular system: S1-S2 heard, regular rate, irregular rhythm, no murmurs gastrointestinal system: Abdomen is nondistended, soft and nontender. No organomegaly or masses felt. Normal bowel sounds heard. Central nervous system: Alert and oriented x2.  No focal deficits Extremities: Symmetric 5 x 5 power. Skin: No rashes, lesions or ulcers Psychiatry: Judgement and insight appear impaired. Mood & affect flattened.     Data Reviewed: I have personally reviewed following labs and imaging studies  CBC: Recent Labs  Lab 04/06/20 1416 04/07/20 0422 04/08/20 0448 04/09/20 0656  WBC 9.2 9.5 13.2* 10.3  NEUTROABS  --   --  9.7* 7.2  HGB 14.1 13.4 13.6 12.4*  HCT 38.8* 37.0* 37.1* 34.9*  MCV 97.5 98.7 99.7 101.2*  PLT 233 238 220 169   Basic Metabolic Panel: Recent Labs  Lab 04/03/20 1603 04/06/20 1416 04/07/20 0422 04/07/20 1533 04/07/20 2216 04/08/20 0448 04/08/20 1004 04/09/20 0656  NA 122* 122* 125* 125* 128* 126* 125* 127*  K 2.8* <2.0* 3.0* 4.0  --  3.3*  --  3.8  CL 71* 70* 78*  --   --  82*  --  89*  CO2 37* 34* 33*  --   --  32  --  28  GLUCOSE 125* 124* 112*  --   --  112*  --  109*  BUN 61* 64* 57*  --   --  42*  --  35*  CREATININE 1.53* 1.25* 1.18  --   --  0.92  --  0.92  CALCIUM 9.8 9.4 9.4  --   --  9.3  --  8.6*  MG  --  2.5*  --   --   --  2.4  --   --    GFR: Estimated Creatinine Clearance: 41.9 mL/min (by C-G formula based on SCr of 0.92 mg/dL). Liver Function Tests: No results for input(s): AST, ALT, ALKPHOS, BILITOT, PROT, ALBUMIN in the last 168 hours. No results for input(s): LIPASE, AMYLASE in the  last 168 hours. No results for input(s): AMMONIA in the last 168 hours. Coagulation Profile: No results for input(s): INR, PROTIME in the last 168 hours. Cardiac Enzymes: Recent Labs  Lab 04/06/20 1416  CKTOTAL 83   BNP (last 3 results) No results for input(s): PROBNP in the last 8760 hours. HbA1C: No results for input(s): HGBA1C in the last 72 hours. CBG: No results for input(s): GLUCAP in the last 168 hours. Lipid Profile: No results for input(s): CHOL, HDL, LDLCALC, TRIG, CHOLHDL, LDLDIRECT in the last 72 hours. Thyroid Function Tests: Recent Labs    04/06/20 1416 04/07/20 1103  TSH 7.167*  --  FREET4  --  1.01  T3FREE  --  1.8*   Anemia Panel: No results for input(s): VITAMINB12, FOLATE, FERRITIN, TIBC, IRON, RETICCTPCT in the last 72 hours. Sepsis Labs: No results for input(s): PROCALCITON, LATICACIDVEN in the last 168 hours.  Recent Results (from the past 240 hour(s))  Resp Panel by RT-PCR (Flu A&B, Covid) Nasopharyngeal Swab     Status: None   Collection Time: 04/06/20  2:16 PM   Specimen: Nasopharyngeal Swab; Nasopharyngeal(NP) swabs in vial transport medium  Result Value Ref Range Status   SARS Coronavirus 2 by RT PCR NEGATIVE NEGATIVE Final    Comment: (NOTE) SARS-CoV-2 target nucleic acids are NOT DETECTED.  The SARS-CoV-2 RNA is generally detectable in upper respiratory specimens during the acute phase of infection. The lowest concentration of SARS-CoV-2 viral copies this assay can detect is 138 copies/mL. A negative result does not preclude SARS-Cov-2 infection and should not be used as the sole basis for treatment or other patient management decisions. A negative result may occur with  improper specimen collection/handling, submission of specimen other than nasopharyngeal swab, presence of viral mutation(s) within the areas targeted by this assay, and inadequate number of viral copies(<138 copies/mL). A negative result must be combined with clinical  observations, patient history, and epidemiological information. The expected result is Negative.  Fact Sheet for Patients:  EntrepreneurPulse.com.au  Fact Sheet for Healthcare Providers:  IncredibleEmployment.be  This test is no t yet approved or cleared by the [place] FDA and  has been authorized for detection and/or diagnosis of SARS-CoV-2 by FDA under an Emergency Use Authorization (EUA). This EUA will remain  in effect (meaning this test can be used) for the duration of the COVID-19 declaration under Section 564(b)(1) of the Act, 21 U.S.C.section 360bbb-3(b)(1), unless the authorization is terminated  or revoked sooner.       Influenza A by PCR NEGATIVE NEGATIVE Final   Influenza B by PCR NEGATIVE NEGATIVE Final    Comment: (NOTE) The Xpert Xpress SARS-CoV-2/FLU/RSV plus assay is intended as an aid in the diagnosis of influenza from Nasopharyngeal swab specimens and should not be used as a sole basis for treatment. Nasal washings and aspirates are unacceptable for Xpert Xpress SARS-CoV-2/FLU/RSV testing.  Fact Sheet for Patients: EntrepreneurPulse.com.au  Fact Sheet for Healthcare Providers: IncredibleEmployment.be  This test is not yet approved or cleared by the [place] FDA and has been authorized for detection and/or diagnosis of SARS-CoV-2 by FDA under an Emergency Use Authorization (EUA). This EUA will remain in effect (meaning this test can be used) for the duration of the COVID-19 declaration under Section 564(b)(1) of the Act, 21 U.S.C. section 360bbb-3(b)(1), unless the authorization is terminated or revoked.  Performed at [facility], [address], [place], [place] [address]          Radiology Studies: No results found.      Scheduled Meds: . amitriptyline  25 mg Oral QHS  . vitamin C  1,000 mg Oral Daily  . calcium-vitamin D  1 tablet Oral Daily  .  cholecalciferol  2,000 Units Oral Daily  . ferrous sulfate  325 mg Oral Q breakfast  . gabapentin  300 mg Oral Daily  . gabapentin  600 mg Oral QHS  . multivitamin with minerals  1 tablet Oral Daily  . polyethylene glycol  17 g Oral Daily  . pyridOXINE  100 mg Oral Daily  . sodium chloride flush  3 mL Intravenous Q12H  . sodium chloride  1 g  Oral BID WC  . tamsulosin  0.4 mg Oral QPC breakfast  . vitamin E  400 Units Oral Daily   Continuous Infusions: . sodium chloride    . cefTRIAXone (ROCEPHIN)  IV 1 g (04/09/20 1047)     LOS: 3 days    Time spent: 15 minutes    Sidney Ace, MD [facility] Pager [phone]  If 7PM-7AM, please contact night-coverage 04/09/2020, 12:54 PM

## 2020-04-09 NOTE — TOC Progression Note (Signed)
Transition of Care [facility]) - Progression Note    Patient Details  Name: Douglas Calhoun MRN: [id number] Date of Birth: 1919-11-21  Transition [facility] Care [facility]) CM/SW Contact  Shelbie Ammons, RN Phone Number: 04/09/2020, 9:33 AM  Clinical Narrative:   RNCM reached out to Ed Weeks with [name] who completed evaluation for possible admission yesterday. Ed reports that he is pretty sure patient will be able to discharge to his facility tomorrow 12/16, they just need to be able to complete paperwork and finish getting room ready.     Expected Discharge Plan: [place] Barriers to Discharge: Continued Medical Work up  Expected Discharge Plan and Services Expected Discharge Plan: [place] In-house Referral: Clinical Social Work     Living arrangements for the past 2 months: Apartment                                       Social Determinants of Health (SDOH) Interventions    Readmission Risk Interventions No flowsheet data found.

## 2020-04-10 ENCOUNTER — Telehealth: Payer: Self-pay | Admitting: Family Medicine

## 2020-04-10 ENCOUNTER — Ambulatory Visit: Payer: Medicare Other | Admitting: Nurse Practitioner

## 2020-04-10 LAB — RESP PANEL BY RT-PCR (FLU A&B, COVID) ARPGX2
Influenza A by PCR: NEGATIVE
Influenza B by PCR: NEGATIVE
SARS Coronavirus 2 by RT PCR: NEGATIVE

## 2020-04-10 NOTE — Telephone Encounter (Signed)
Copied from [place] [id number]. Topic: Quick Communication - Home Health Verbal Orders >> Apr 10, 2020  4:34 PM Yvette Rack wrote: Caller/Agency: [place] with [facility] Callback Number: [phone] Requesting OT/PT/Skilled Nursing/Social Work/Speech Therapy: Speech Therapy/Physical Therapy Frequency: Speech Therapy = 1 time a week for 4 weeks  Physical Therapy = 1 time a week for 1 week

## 2020-04-10 NOTE — Progress Notes (Signed)
This RN provided discharge instructions and teaching to the patient. The patient verbalized and demonstrated understanding of the provided instructions. All outstanding questions resolved. All belongings packed and in tow. R Arm PIV removed. Cannula intact. Patient to return to [facility] assisted living facility and patient's family to transport patient to destination. Patient's son Shanon Brow was called twice in regards to picking up father for discharge and stated both times that the family was on the way.

## 2020-04-10 NOTE — TOC Transition Note (Signed)
Transition of Care [facility]) - CM/SW Discharge Note   Patient Details  Name: Douglas Calhoun MRN: [id number] Date of Birth: 1920-04-10  Transition of Care [facility]) CM/SW Contact:  Meriel Flavors, LCSW Phone Number: 04/10/2020, 2:05 PM   Clinical Narrative:    Patient discharging to [facility] today. All info faxed over to [facility]. Family will transport patient   Final next level of care: Assisted Living Barriers to Discharge: Continued Medical Work up   Patient Goals and CMS Choice Patient states their goals for this hospitalization and ongoing recovery are:: Patient has bed at [facility].  Debbie contact for [facility] [phone]      Discharge Placement                       Discharge Plan and Services In-house Referral: Clinical Social Work                                   Social Determinants of Health (SDOH) Interventions     Readmission Risk Interventions No flowsheet data found.

## 2020-04-10 NOTE — Progress Notes (Signed)
Patient in bed in resting. Patients POA Douglas Calhoun called and spoke with patient regarding transportation arrangements for in the morning. Patients PM medications given. Will continue to monitor.

## 2020-04-10 NOTE — Discharge Summary (Addendum)
Physician Discharge Summary  Douglas Calhoun PPJ:[id number] DOB: 04-29-1919 DOA: 04/06/2020  PCP: Birdie Sons, MD  Admit date: 04/06/2020 Discharge date: 04/10/2020  Admitted From: Home Disposition:  ALF- [name]  Recommendations for Outpatient Follow-up:  1. Follow up with PCP in 1-2 weeks 2.   Home Health:No Equipment/Devices:None Discharge Condition:Stable CODE STATUS:DNR Diet recommendation: 2g sodium  Brief/Interim Summary: Douglas Calhoun a 86 y.o.malewith medical history significant foratrial fibrillation on Eliquis, discontinued on 02/01/2020 by cardiologist due to recent falls, systolic heart failure with last EF 25% on 02/03/2019, sick sinus syndrome status post pacemaker on 9/20who was brought into the emergency room by EMS for evaluation of mental status changes. Per family they told patient appeared more confused than his baseline. Family reported to EMS that he had stopped taking his medications 2 days prior to his hospitalization because he no longer wishes to take them and would like to go be with the Douglas Calhoun.Patient lives in retirement home. He has a staff from Monday through Friday. No other complaint.  12/15: No acute status changes overnight.  Patient's been relatively stable over the course of last few days.  Per case management patient will have bed available at assisted living 04/10/2020.  12/16: No acute status changes overnight.  Patient has been stable over the last few days.  Bed available at [facility] today.  Patient discharged in stable condition.  Note: Chest x-ray from 1212 personally reviewed.  No evidence of active TB.   Discharge Diagnoses:  Principal Problem:   Hypokalemia Active Problems:   Chronic systolic heart failure (HCC)   Status cardiac pacemaker   Hyponatremia, acute on chronic   Generalized weakness   AF (paroxysmal atrial fibrillation) (HCC)  Hypokalemia Resolved after replacement   Generalized  weakness/frequent falls:  Due to combination of aging, deconditioning and multiple electrolyte abnormalities. Assessed by PT OT and they recommend home health PT. Will discharge to assisted living facility with home health physical therapy  Acute metabolic encephalopathy: Patient alert and oriented. Back to his baseline.  Acute on chronic hyponatremia: All work-up is negative for SIADH. He may be having hypovolemic hyponatremia.  Sodium improved from presentation however remains a stable around 125. Plan: Continue salt tabs  Chronic systolic heart failure ([place]) last EF 25% 10/20: Fairly compensated.  Holding diuretic therapy for now.  Paroxysmal atrial fibrillation Currently off all meds per review of cardiology consult note from 10/8 Not on chronic anticoagulation due to history of frequent falls.  Rates controlled.  Status cardiac pacemaker No acute disease suspected  BPH Continue Flomax  Subclinical hypothyroidism:  Slightly elevated TSH and slightly limited free T3 but normal T4.  No indication for treatment.  UTI?  UA indicative of UTI in a male patient.  Patient was unable to provide any history due to encephalopathy.  For some reason, urine culture was not sent.  No further antibiotics indicated on discharge  Aging/multiple medical problems:  Consulted palliative care to have goal of care conversation with patient. Recommendations appreciated Patient DNR Referral to outpatient palliative care: [facility] outpatient [facility]iative to follow   Discharge Instructions  Discharge Instructions    Diet - low sodium heart healthy   Complete by: As directed    Increase activity slowly   Complete by: As directed      Allergies as of 04/10/2020   No Known Allergies     Medication List    STOP taking these medications   METAMUCIL PO   metolazone 2.5 MG tablet  Commonly known as: ZAROXOLYN   Potassium Chloride ER 20 MEQ Tbcr    pyridOXINE 100 MG tablet Commonly known as: VITAMIN B-6   torsemide 20 MG tablet Commonly known as: DEMADEX   vitamin C 1000 MG tablet     TAKE these medications   acetaminophen 325 MG tablet Commonly known as: TYLENOL Take 2 tablets (650 mg total) by mouth every 6 (six) hours as needed for mild pain.   amitriptyline 25 MG tablet Commonly known as: ELAVIL Take 25 mg by mouth at bedtime.   CALCIUM 500 + D PO Take 1 tablet by mouth daily.   cholecalciferol 25 MCG (1000 UNIT) tablet Commonly known as: VITAMIN D3 Take 2,000 Units by mouth daily.   ferrous sulfate 325 (65 FE) MG EC tablet Take 325 mg by mouth daily with breakfast.   gabapentin 100 MG capsule Commonly known as: NEURONTIN TAKE 1 CAPSULE IN THE MORNING AND TWO AT NIGHT   MIRALAX PO Take 1 Dose by mouth daily. Heaping teaspoon   MULTIVITAMIN ADULT PO Take 1 tablet by mouth daily.   sodium chloride 1 g tablet Take 1 tablet (1 g total) by mouth 2 (two) times daily with a meal.   tamsulosin 0.4 MG Caps capsule Commonly known as: FLOMAX Take 0.4 mg by mouth daily after breakfast.   Vitamin E 400 units Tabs Take 1 tablet by mouth daily.       No Known Allergies  Consultations:  Palliative care   Procedures/Studies: DG Chest 2 View  Result Date: 03/17/2020 CLINICAL DATA:  Recent pneumonia EXAM: CHEST - 2 VIEW COMPARISON:  February 05, 2020 FINDINGS: There has been clearing of ill-defined opacity from the left base. There is residual scarring in each lung base. Currently no edema or consolidation. Heart is upper normal in size with pulmonary vascularity normal. No adenopathy. Apparent loop recorder on the left. There is aortic atherosclerosis. There are old healed rib fractures on the right. Bones are osteoporotic. IMPRESSION: Currently no edema or airspace opacity. Scarring in the bases noted. Stable cardiac silhouette. Osteoporosis. Aortic Atherosclerosis (ICD10-I70.0). Electronically Signed   By:  Lowella Grip III M.D.   On: 03/17/2020 09:34   DG Ribs Unilateral Left  Result Date: 03/17/2020 CLINICAL DATA:  Pain following fall EXAM: LEFT RIBS - 2 VIEW COMPARISON:  Chest radiograph March 14, 2020 FINDINGS: Frontal and oblique rib images were obtained. There are mildly displaced fractures of the anterior left sixth, seventh, eighth, and ninth ribs. No evident pneumothorax. There is scarring in the left base. No pleural effusion. Stable cardiac silhouette. There is aortic atherosclerosis. Bones are osteoporotic. IMPRESSION: Mildly displaced recent appearing fractures of the left sixth, seventh, eighth, and ninth ribs. No pneumothorax. Scarring left base. No pleural effusion. Bones osteoporotic. These results will be called to the ordering clinician or representative by the Radiologist Assistant, and communication documented in the PACS or [employer]. Electronically Signed   By: Lowella Grip III M.D.   On: 03/17/2020 09:38   CT Head Wo Contrast  Result Date: 04/06/2020 CLINICAL DATA:  86 year old male with history of mental status change. EXAM: CT HEAD WITHOUT CONTRAST TECHNIQUE: Contiguous axial images were obtained from the base of the skull through the vertex without intravenous contrast. COMPARISON:  No priors. FINDINGS: Brain: Mild to moderate cerebral and mild cerebellar atrophy. Patchy and confluent areas of decreased attenuation are noted throughout the deep and periventricular white matter of the cerebral hemispheres bilaterally, compatible with chronic microvascular ischemic disease. No evidence of  acute infarction, hemorrhage, hydrocephalus, extra-axial collection or mass lesion/mass effect. Vascular: No hyperdense vessel or unexpected calcification. Skull: Normal. Negative for fracture or focal lesion. Sinuses/Orbits: No acute finding. Other: None. IMPRESSION: 1. No acute intracranial abnormalities. 2. Mild-to-moderate cerebral and cerebellar atrophy with extensive chronic  microvascular ischemic changes in the cerebral white matter, as above. Electronically Signed   By: Vinnie Langton M.D.   On: 04/06/2020 15:35   DG Chest Port 1 View  Result Date: 04/06/2020 CLINICAL DATA:  Shortness of breath. EXAM: PORTABLE CHEST 1 VIEW COMPARISON:  March 14, 2020. FINDINGS: The heart size and mediastinal contours are within normal limits. No pneumothorax is noted. Minimal right basilar subsegmental atelectasis or scarring is noted. Increased left basilar atelectasis or infiltrate is noted. No pneumothorax or significant pleural effusion is noted. The visualized skeletal structures are unremarkable. IMPRESSION: Minimal right basilar subsegmental atelectasis or scarring. Increased left basilar atelectasis or infiltrate is noted. Electronically Signed   By: Marijo Conception M.D.   On: 04/06/2020 16:12    (Echo, Carotid, EGD, Colonoscopy, ERCP)    Subjective: Patient seen and examined.  Stable for discharge back to brachial today.  No acute issues  Discharge Exam: Vitals:   04/10/20 0503 04/10/20 0752  BP: 128/65 (!) 97/47  Pulse: 86 (!) 57  Resp: 20 16  Temp:  (!) 97.4 F (36.3 C)  SpO2: 92% 96%   Vitals:   04/09/20 2159 04/10/20 0140 04/10/20 0503 04/10/20 0752  BP: 105/67 96/65 128/65 (!) 97/47  Pulse: 77 60 86 (!) 57  Resp: 16 17 20 16   Temp: 97.7 F (36.5 C) 98 F (36.7 C)  (!) 97.4 F (36.3 C)  TempSrc:      SpO2: 96% 93% 92% 96%  Weight:      Height:        General: Pt is alert, awake, not in acute distress Cardiovascular: RRR, S1/S2 +, no rubs, no gallops Respiratory: CTA bilaterally, no wheezing, no rhonchi Abdominal: Soft, NT, ND, bowel sounds + Extremities: no edema, no cyanosis    The results of significant diagnostics from this hospitalization (including imaging, microbiology, ancillary and laboratory) are listed below for reference.     Microbiology: Recent Results (from the past 240 hour(s))  Resp Panel by RT-PCR (Flu A&B, Covid)  Nasopharyngeal Swab     Status: None   Collection Time: 04/06/20  2:16 PM   Specimen: Nasopharyngeal Swab; Nasopharyngeal(NP) swabs in vial transport medium  Result Value Ref Range Status   SARS Coronavirus 2 by RT PCR NEGATIVE NEGATIVE Final    Comment: (NOTE) SARS-CoV-2 target nucleic acids are NOT DETECTED.  The SARS-CoV-2 RNA is generally detectable in upper respiratory specimens during the acute phase of infection. The lowest concentration of SARS-CoV-2 viral copies this assay can detect is 138 copies/mL. A negative result does not preclude SARS-Cov-2 infection and should not be used as the sole basis for treatment or other patient management decisions. A negative result may occur with  improper specimen collection/handling, submission of specimen other than nasopharyngeal swab, presence of viral mutation(s) within the areas targeted by this assay, and inadequate number of viral copies(<138 copies/mL). A negative result must be combined with clinical observations, patient history, and epidemiological information. The expected result is Negative.  Fact Sheet for Patients:  EntrepreneurPulse.com.au  Fact Sheet for Healthcare Providers:  IncredibleEmployment.be  This test is no t yet approved or cleared by the [place] FDA and  has been authorized for detection and/or diagnosis of SARS-CoV-2  by FDA under an Emergency Use Authorization (EUA). This EUA will remain  in effect (meaning this test can be used) for the duration of the COVID-19 declaration under Section 564(b)(1) of the Act, 21 U.S.C.section 360bbb-3(b)(1), unless the authorization is terminated  or revoked sooner.       Influenza A by PCR NEGATIVE NEGATIVE Final   Influenza B by PCR NEGATIVE NEGATIVE Final    Comment: (NOTE) The Xpert Xpress SARS-CoV-2/FLU/RSV plus assay is intended as an aid in the diagnosis of influenza from Nasopharyngeal swab specimens and should not be  used as a sole basis for treatment. Nasal washings and aspirates are unacceptable for Xpert Xpress SARS-CoV-2/FLU/RSV testing.  Fact Sheet for Patients: EntrepreneurPulse.com.au  Fact Sheet for Healthcare Providers: IncredibleEmployment.be  This test is not yet approved or cleared by the [place] FDA and has been authorized for detection and/or diagnosis of SARS-CoV-2 by FDA under an Emergency Use Authorization (EUA). This EUA will remain in effect (meaning this test can be used) for the duration of the COVID-19 declaration under Section 564(b)(1) of the Act, 21 U.S.C. section 360bbb-3(b)(1), unless the authorization is terminated or revoked.  Performed at [facility], [address] [place], [place] [address]      Labs: BNP (last 3 results) Recent Labs    04/06/20 1416  BNP 269.4*   Basic Metabolic Panel: Recent Labs  Lab 04/03/20 1603 04/06/20 1416 04/07/20 0422 04/07/20 1533 04/07/20 2216 04/08/20 0448 04/08/20 1004 04/09/20 0656  NA 122* 122* 125* 125* 128* 126* 125* 127*  K 2.8* <2.0* 3.0* 4.0  --  3.3*  --  3.8  CL 71* 70* 78*  --   --  82*  --  89*  CO2 37* 34* 33*  --   --  32  --  28  GLUCOSE 125* 124* 112*  --   --  112*  --  109*  BUN 61* 64* 57*  --   --  42*  --  35*  CREATININE 1.53* 1.25* 1.18  --   --  0.92  --  0.92  CALCIUM 9.8 9.4 9.4  --   --  9.3  --  8.6*  MG  --  2.5*  --   --   --  2.4  --   --    Liver Function Tests: No results for input(s): AST, ALT, ALKPHOS, BILITOT, PROT, ALBUMIN in the last 168 hours. No results for input(s): LIPASE, AMYLASE in the last 168 hours. No results for input(s): AMMONIA in the last 168 hours. CBC: Recent Labs  Lab 04/06/20 1416 04/07/20 0422 04/08/20 0448 04/09/20 0656  WBC 9.2 9.5 13.2* 10.3  NEUTROABS  --   --  9.7* 7.2  HGB 14.1 13.4 13.6 12.4*  HCT 38.8* 37.0* 37.1* 34.9*  MCV 97.5 98.7 99.7 101.2*  PLT 233 238 220 193   Cardiac  Enzymes: Recent Labs  Lab 04/06/20 1416  CKTOTAL 83   BNP: Invalid input(s): POCBNP CBG: No results for input(s): GLUCAP in the last 168 hours. D-Dimer No results for input(s): DDIMER in the last 72 hours. Hgb A1c No results for input(s): HGBA1C in the last 72 hours. Lipid Profile No results for input(s): CHOL, HDL, LDLCALC, TRIG, CHOLHDL, LDLDIRECT in the last 72 hours. Thyroid function studies No results for input(s): TSH, T4TOTAL, T3FREE, THYROIDAB in the last 72 hours.  Invalid input(s): FREET3 Anemia work up No results for input(s): VITAMINB12, FOLATE, FERRITIN, TIBC, IRON, RETICCTPCT in the last 72 hours.  Urinalysis    Component Value Date/Time   COLORURINE YELLOW (A) 04/06/2020 1855   APPEARANCEUR CLOUDY (A) 04/06/2020 1855   APPEARANCEUR Clear 04/02/2019 1448   LABSPEC 1.009 04/06/2020 1855   PHURINE 7.0 04/06/2020 1855   GLUCOSEU NEGATIVE 04/06/2020 1855   HGBUR SMALL (A) 04/06/2020 1855   BILIRUBINUR NEGATIVE 04/06/2020 1855   BILIRUBINUR Negative 04/02/2019 1448   KETONESUR NEGATIVE 04/06/2020 1855   PROTEINUR 30 (A) 04/06/2020 1855   UROBILINOGEN 0.2 08/14/2018 1436   NITRITE NEGATIVE 04/06/2020 1855   LEUKOCYTESUR LARGE (A) 04/06/2020 1855   Sepsis Labs Invalid input(s): PROCALCITONIN,  WBC,  LACTICIDVEN Microbiology Recent Results (from the past 240 hour(s))  Resp Panel by RT-PCR (Flu A&B, Covid) Nasopharyngeal Swab     Status: None   Collection Time: 04/06/20  2:16 PM   Specimen: Nasopharyngeal Swab; Nasopharyngeal(NP) swabs in vial transport medium  Result Value Ref Range Status   SARS Coronavirus 2 by RT PCR NEGATIVE NEGATIVE Final    Comment: (NOTE) SARS-CoV-2 target nucleic acids are NOT DETECTED.  The SARS-CoV-2 RNA is generally detectable in upper respiratory specimens during the acute phase of infection. The lowest concentration of SARS-CoV-2 viral copies this assay can detect is 138 copies/mL. A negative result does not preclude  SARS-Cov-2 infection and should not be used as the sole basis for treatment or other patient management decisions. A negative result may occur with  improper specimen collection/handling, submission of specimen other than nasopharyngeal swab, presence of viral mutation(s) within the areas targeted by this assay, and inadequate number of viral copies(<138 copies/mL). A negative result must be combined with clinical observations, patient history, and epidemiological information. The expected result is Negative.  Fact Sheet for Patients:  EntrepreneurPulse.com.au  Fact Sheet for Healthcare Providers:  IncredibleEmployment.be  This test is no t yet approved or cleared by the [place] FDA and  has been authorized for detection and/or diagnosis of SARS-CoV-2 by FDA under an Emergency Use Authorization (EUA). This EUA will remain  in effect (meaning this test can be used) for the duration of the COVID-19 declaration under Section 564(b)(1) of the Act, 21 U.S.C.section 360bbb-3(b)(1), unless the authorization is terminated  or revoked sooner.       Influenza A by PCR NEGATIVE NEGATIVE Final   Influenza B by PCR NEGATIVE NEGATIVE Final    Comment: (NOTE) The Xpert Xpress SARS-CoV-2/FLU/RSV plus assay is intended as an aid in the diagnosis of influenza from Nasopharyngeal swab specimens and should not be used as a sole basis for treatment. Nasal washings and aspirates are unacceptable for Xpert Xpress SARS-CoV-2/FLU/RSV testing.  Fact Sheet for Patients: EntrepreneurPulse.com.au  Fact Sheet for Healthcare Providers: IncredibleEmployment.be  This test is not yet approved or cleared by the [place] FDA and has been authorized for detection and/or diagnosis of SARS-CoV-2 by FDA under an Emergency Use Authorization (EUA). This EUA will remain in effect (meaning this test can be used) for the duration of  the COVID-19 declaration under Section 564(b)(1) of the Act, 21 U.S.C. section 360bbb-3(b)(1), unless the authorization is terminated or revoked.  Performed at [facility], [address], [place], [place] [address]      Time coordinating discharge: Over 30 minutes  SIGNED:   Sidney Ace, MD  [facility] 04/10/2020, 11:25 AM Pager   If 7PM-7AM, please contact night-coverage

## 2020-04-10 NOTE — Progress Notes (Addendum)
[facility] arrived ready to transport patient to [facility]. While EMS was her they informed this nurse that I needed to call EMS supervisor Karoline Caldwell whom informed me that Per POA Donavyn Fecher does not authorize [facility] to transport this patient due to medical necessity not being met for EMS transportation. This nurse then spoke with POA Marcella Dubs who states his mother (patients daughter) will be transporting this patient tomorrow 12/17 via her personal vehicle to [facility].

## 2020-04-10 NOTE — Care Management Important Message (Signed)
Important Message  Patient Details  Name: Douglas Calhoun MRN: [id number] Date of Birth: 29-Dec-1919   Medicare Important Message Given:     Talked with grandson, Douglas Calhoun, [place] [phone]) and reviewed the Important Message from Medicare.  He is in agreement with discharge plan today to [facility].   Juliann Pulse A Aleja Yearwood 04/10/2020, 11:11 AM

## 2020-04-10 NOTE — Plan of Care (Signed)
  Problem: Education: Goal: Knowledge of General Education information will improve Description: Including pain rating scale, medication(s)/side effects and non-pharmacologic comfort measures 04/10/2020 1347 by Cristela Blue, RN Outcome: Progressing 04/10/2020 1347 by Cristela Blue, RN Outcome: Progressing   Problem: Health Behavior/Discharge Planning: Goal: Ability to manage health-related needs will improve 04/10/2020 1347 by Cristela Blue, RN Outcome: Progressing 04/10/2020 1347 by Cristela Blue, RN Outcome: Progressing   Problem: Clinical Measurements: Goal: Ability to maintain clinical measurements within normal limits will improve 04/10/2020 1347 by Cristela Blue, RN Outcome: Progressing 04/10/2020 1347 by Cristela Blue, RN Outcome: Progressing Goal: Will remain free from infection 04/10/2020 1347 by Cristela Blue, RN Outcome: Progressing 04/10/2020 1347 by Cristela Blue, RN Outcome: Progressing Goal: Diagnostic test results will improve 04/10/2020 1347 by Cristela Blue, RN Outcome: Progressing 04/10/2020 1347 by Cristela Blue, RN Outcome: Progressing Goal: Respiratory complications will improve 04/10/2020 1347 by Cristela Blue, RN Outcome: Progressing 04/10/2020 1347 by Cristela Blue, RN Outcome: Progressing Goal: Cardiovascular complication will be avoided 04/10/2020 1347 by Cristela Blue, RN Outcome: Progressing 04/10/2020 1347 by Cristela Blue, RN Outcome: Progressing   Problem: Activity: Goal: Risk for activity intolerance will decrease 04/10/2020 1347 by Cristela Blue, RN Outcome: Progressing 04/10/2020 1347 by Cristela Blue, RN Outcome: Progressing   Problem: Nutrition: Goal: Adequate nutrition will be maintained 04/10/2020 1347 by Cristela Blue, RN Outcome: Progressing 04/10/2020 1347 by Cristela Blue, RN Outcome: Progressing   Problem: Coping: Goal: Level of anxiety will decrease 04/10/2020 1347 by Cristela Blue, RN Outcome:  Progressing 04/10/2020 1347 by Cristela Blue, RN Outcome: Progressing   Problem: Elimination: Goal: Will not experience complications related to bowel motility 04/10/2020 1347 by Cristela Blue, RN Outcome: Progressing 04/10/2020 1347 by Cristela Blue, RN Outcome: Progressing Goal: Will not experience complications related to urinary retention 04/10/2020 1347 by Cristela Blue, RN Outcome: Progressing 04/10/2020 1347 by Cristela Blue, RN Outcome: Progressing   Problem: Pain Managment: Goal: General experience of comfort will improve 04/10/2020 1347 by Cristela Blue, RN Outcome: Progressing 04/10/2020 1347 by Cristela Blue, RN Outcome: Progressing   Problem: Safety: Goal: Ability to remain free from injury will improve 04/10/2020 1347 by Cristela Blue, RN Outcome: Progressing 04/10/2020 1347 by Cristela Blue, RN Outcome: Progressing   Problem: Skin Integrity: Goal: Risk for impaired skin integrity will decrease 04/10/2020 1347 by Cristela Blue, RN Outcome: Progressing 04/10/2020 1347 by Cristela Blue, RN Outcome: Progressing

## 2020-04-11 NOTE — TOC Transition Note (Signed)
Transition of Care [facility]) - CM/SW Discharge Note   Patient Details  Name: Douglas Calhoun MRN: [id number] Date of Birth: June 17, 1919  [facility] CM/SW Contact:  Shelbie Ammons, RN Phone Number: 04/11/2020, 9:45 AM   Clinical Narrative:   RNCM reached out to Debbie with [name], per Jackelyn Poling the only documentation that they are missing is patient's negative Covid result. This information was faxed to [phone]. Patient is ready for discharge once family arrives.     Final next level of care: Assisted Living Barriers to Discharge: Continued Medical Work up   Patient Goals and CMS Choice Patient states their goals for this hospitalization and ongoing recovery are:: Patient has bed at [facility].  Debbie contact for [facility] [phone]      Discharge Placement                       Discharge Plan and Services In-house Referral: Clinical Social Work                                   Social Determinants of Health (SDOH) Interventions     Readmission Risk Interventions No flowsheet data found.

## 2020-04-11 NOTE — Telephone Encounter (Signed)
[place]ence as below.

## 2020-04-11 NOTE — Progress Notes (Signed)
Brief progress note.  This is a nonbillable note.  Patient was prepared and ready for discharge on 04/10/2020 however due to reasons outside of our control the patient could not leave the hospital until this morning 04/11/2020.  On my evaluation he is sitting up in bed watching TV.  He is in no visible distress.  He will be transported to assisted living facility with his family members were present at bedside.  Please see discharge summary from 04/10/2020 for hospital course  No charge  Ralene Muskrat MD

## 2020-04-11 NOTE — Telephone Encounter (Signed)
Please review. Thanks!  

## 2020-04-11 NOTE — Plan of Care (Signed)

## 2020-04-11 NOTE — Plan of Care (Signed)
Pt being discharged today,in bed watching TV,Alert and verbal,able to communicate needs appropriately.NAD noted.Appetite and fluid intake fair.Daughter-in-law by bedside to assist Pt with transportation.Pt in good spirits to go back to ALF.no ssx of any distress.

## 2020-04-11 NOTE — Telephone Encounter (Signed)
That's fine

## 2020-04-14 ENCOUNTER — Telehealth: Payer: Self-pay

## 2020-04-14 DIAGNOSIS — R011 Cardiac murmur, unspecified: Secondary | ICD-10-CM | POA: Diagnosis not present

## 2020-04-14 NOTE — Telephone Encounter (Signed)
°  Patient was recently discharged from the hospital on 04/11/20.  No TCM completed, patient does not qualify for TCM services due to being d/c to [facility].   Per discharge summary patient needs follow up with PCP in 1-2 weeks. No HFU apt made at this time. FYI!

## 2020-04-14 NOTE — Telephone Encounter (Signed)
I called and spoke with Palliative Care. I was advised that [facility] referred patient to them to follow with palliative care after his recent hospitalization . Denise with palliative care wants to know if Dr. Caryn Section agrees with referral since he is patients PCP. I advise them that Dr. Caryn Section is out of the office until 04/21/2020.Palliative care states this message is ok to wait until Dr. Caryn Section returns for review.

## 2020-04-14 NOTE — Telephone Encounter (Signed)
Copied from [place] [id number]. Topic: Quick Communication - See Telephone Encounter >> Apr 14, 2020  3:44 PM Loma Boston wrote: CRM for notification. See Telephone encounter for: 04/14/20. Denise form Palliative Care has called, states that [facility] refer pt to them, call Palliative Care Langley Gauss) back at this number [phone] Option 2 to concur on this pt.

## 2020-04-15 NOTE — Telephone Encounter (Signed)
Agree with palliative care referral

## 2020-04-15 NOTE — Telephone Encounter (Signed)
Tried to call Fraser Din, patients caregiver for an update and was unable to leave a message

## 2020-04-15 NOTE — Telephone Encounter (Signed)
Denise advised

## 2020-04-16 DIAGNOSIS — Z7901 Long term (current) use of anticoagulants: Secondary | ICD-10-CM | POA: Diagnosis not present

## 2020-04-16 DIAGNOSIS — R531 Weakness: Secondary | ICD-10-CM | POA: Diagnosis not present

## 2020-04-16 DIAGNOSIS — R2681 Unsteadiness on feet: Secondary | ICD-10-CM | POA: Diagnosis not present

## 2020-04-16 DIAGNOSIS — I110 Hypertensive heart disease with heart failure: Secondary | ICD-10-CM | POA: Diagnosis not present

## 2020-04-16 DIAGNOSIS — I5030 Unspecified diastolic (congestive) heart failure: Secondary | ICD-10-CM | POA: Diagnosis not present

## 2020-04-16 DIAGNOSIS — Z950 Presence of cardiac pacemaker: Secondary | ICD-10-CM | POA: Diagnosis not present

## 2020-04-16 DIAGNOSIS — E261 Secondary hyperaldosteronism: Secondary | ICD-10-CM | POA: Diagnosis not present

## 2020-04-16 DIAGNOSIS — I4891 Unspecified atrial fibrillation: Secondary | ICD-10-CM | POA: Diagnosis not present

## 2020-04-16 DIAGNOSIS — I739 Peripheral vascular disease, unspecified: Secondary | ICD-10-CM | POA: Diagnosis not present

## 2020-04-17 DIAGNOSIS — I5022 Chronic systolic (congestive) heart failure: Secondary | ICD-10-CM | POA: Diagnosis not present

## 2020-04-17 DIAGNOSIS — L853 Xerosis cutis: Secondary | ICD-10-CM | POA: Diagnosis not present

## 2020-04-17 DIAGNOSIS — I4891 Unspecified atrial fibrillation: Secondary | ICD-10-CM | POA: Diagnosis not present

## 2020-04-17 DIAGNOSIS — R221 Localized swelling, mass and lump, neck: Secondary | ICD-10-CM | POA: Diagnosis not present

## 2020-04-17 DIAGNOSIS — I714 Abdominal aortic aneurysm, without rupture: Secondary | ICD-10-CM | POA: Diagnosis not present

## 2020-04-17 DIAGNOSIS — E042 Nontoxic multinodular goiter: Secondary | ICD-10-CM | POA: Diagnosis not present

## 2020-04-17 DIAGNOSIS — I700 Atherosclerosis of aorta: Secondary | ICD-10-CM | POA: Diagnosis not present

## 2020-04-17 DIAGNOSIS — I495 Sick sinus syndrome: Secondary | ICD-10-CM | POA: Diagnosis not present

## 2020-04-17 DIAGNOSIS — D649 Anemia, unspecified: Secondary | ICD-10-CM | POA: Diagnosis not present

## 2020-04-18 ENCOUNTER — Encounter: Payer: Self-pay | Admitting: Nurse Practitioner

## 2020-04-18 ENCOUNTER — Non-Acute Institutional Stay: Payer: Medicare Other | Admitting: Nurse Practitioner

## 2020-04-18 ENCOUNTER — Other Ambulatory Visit: Payer: Self-pay

## 2020-04-18 DIAGNOSIS — Z515 Encounter for palliative care: Secondary | ICD-10-CM | POA: Diagnosis not present

## 2020-04-18 DIAGNOSIS — R590 Localized enlarged lymph nodes: Secondary | ICD-10-CM | POA: Diagnosis not present

## 2020-04-18 DIAGNOSIS — I5022 Chronic systolic (congestive) heart failure: Secondary | ICD-10-CM

## 2020-04-18 DIAGNOSIS — R229 Localized swelling, mass and lump, unspecified: Secondary | ICD-10-CM | POA: Diagnosis not present

## 2020-04-18 DIAGNOSIS — D492 Neoplasm of unspecified behavior of bone, soft tissue, and skin: Secondary | ICD-10-CM | POA: Diagnosis not present

## 2020-04-18 NOTE — Progress Notes (Signed)
Therapist, nutritional Palliative Care Consult Note Telephone: [phone]  Fax: [phone]  PATIENT NAME: Douglas Calhoun DOB: 1919/10/28 MRN: [id number]  PRIMARY CARE PROVIDER:   Malva Limes, MD  REFERRING PROVIDER:  Malva Limes, MD [address] [place],  [place] [address]  RESPONSIBLE PARTY:   Hilding Mallary [phone]  1. Advance Care Planning; DNR; DNR and most form completed. MOST form says DNR, limited additional interventions, antibiotics, IV fluids, no feeding tube  2. Goals of Care: Goals include to maximize quality of life and symptom management. Our advance care planning conversation included a discussion about:     The value and importance of advance care planning   Exploration of personal, cultural or spiritual beliefs that might influence medical decisions   Exploration of goals of care in the event of a sudden injury or illness   Identification and preparation of a healthcare agent   Review and updating or creation of an  advance directive document.  3. Palliative care encounter; Palliative care encounter; Palliative medicine team will continue to support patient, patient's family, and medical team. Visit consisted of counseling and education dealing with the complex and emotionally intense issues of symptom management and palliative care in the setting of serious and potentially life-threatening illness  4. f/u 2 weeks for ongoing monitoring dyspnea, work with therapy and possible transition to hospice  I spent 90 minutes providing this consultation, starting at 8L30am. More than 50% of the time in this consultation was spent coordinating communication.   HISTORY OF PRESENT ILLNESS:  Douglas Calhoun is a 86 y.o. year old male with multiple medical problems including Prostate cancer s/p 15 radiation treatments, sick sinus syndrome s/p pacemaker, atrial fibrillation, systolic congestive heart failure, cardiomyopathy,  compression fractures of lumbar spine, varicose veins, cataracts, history of herpes zoster, hearing loss, breast surgery, appendectomy. Hospitalize 12 / 12 / 2021 to 12 / 16 / 2021 for evaluation of mental status changes. Eliquis was discontinued on 10/8 / 2021 by Cardiology due to recent falls, congestive heart failure with last EF 25% on 10 / 01/2019. Discharge to assisted living facility with Home Health. Back to baseline upon discharge    Initial in person Palliative care visit for complex medical decision-making. I visited Douglas Calhoun at [facility]. Douglas Calhoun was currently sitting in the dining area finishing up his breakfast. Douglas Calhoun and I walked to his room. Douglas Calhoun uses a walker. Staff endorses Douglas. Kope does do all of his own adl's including bathing, dressing, toileting. Douglas Calhoun feed himself and appetite has been good since he has transition to [facility] from an independent apartment. Douglas Calhoun and I talked about his appetite. Douglas Calhoun endorses that is good. Douglas Calhoun denies any pain at present. Shortness of breath is intermittent with audible wheezes with exertion though improves with rest. Douglas Calhoun and I talked about past medical history and the study of chronic disease, natural aging. We talked about recent hospitalization. Douglas. Calhoun talked about family dynamics that grandson, Jonny Ruiz helps take care of him and said that he was going to have to live in a Assisted Living where he could receive more help. Douglas. Calhoun endorses he believes it is a good thing. We talked about challenges residing in Assisted living facility that he sees. We talked about Life review. We talked about therapy. Douglas Calhoun endorsed is he feels like he is doing well therapy. Douglas. Calhoun walking up and  down the halls for 5 times a day with his walker. No falls. We talked about medical goals of care his wishes to be a DNR. We talked about role of palliative care in plan of care. Discuss will contact grandson for further discussions of  medical goals of care. I updated nursing staff. Discuss that will return for follow-up visit in 2 weeks to see how he does with therapy. Depending on how that goes his EF is 25% will talk with grandson about the possible option of hospice once rehab is completed. I called Erroll Luna, Grandson, HCPOA. We talked about purpose of PC visit. We talked about PC visit with Douglas. Stancil. We talked about past medical history, chronic disease progression, recent hospitalization. We talked about symptoms. We talked about transition to ALF at [facility]. We talked about medical goals of care. We talked about Jenny Reichmann being [place]. We talked about therapy, seeing how Douglas. Clingenpeel does over the next few weeks. We talked about Hospice benefit under Medicare program, what services are provided. We talked about revisiting Hospice eligibility once therapy completed. We talked about challenges with loss of independence. We talked about grieving process. We talked about role of PC in POC. Therapeutic listening, emotional support provided, questions answered, contact information provided.  Palliative Care was asked to help to continue to address goals of care.   CODE STATUS: DNR  PPS: 50% HOSPICE ELIGIBILITY/DIAGNOSIS: TBD  PAST MEDICAL HISTORY:  Past Medical History:  Diagnosis Date  . Achilles tendinitis   . Cardiomyopathy ([place])    a. 01/2019 Echo: EF 25%, glob HK.  . Cataract   . Chronic HFrEF (heart failure with reduced ejection fraction) ([place])    a. 03/2018 Echo: EF 45-50%); b. 01/2019 Echo: EF 25%. Glob HK. Triv AI/PR, Mod TR. RVSP 57.27mmHg.  Marland Kitchen Hearing loss   . Herpes zoster without complication 9/48/5462  . Permanent atrial fibrillation (HCC)    a. CHA2DS2VASc = 4-->Eliquis.  . Personal history of prostate cancer 10/08/2014   s/p 15 radiation treatments treated by Dr. Madelin Headings   . Prostate cancer ([place]) 03/14/2020  . SSS (sick sinus syndrome) (HCC)    a. s/p MDT Micra AV leadless PPM (ser # [id number]).  . Varicose  vein of leg     SOCIAL HX:  Social History   Tobacco Use  . Smoking status: Never Smoker  . Smokeless tobacco: Never Used  Substance Use Topics  . Alcohol use: Not Currently    Alcohol/week: 0.0 standard drinks    ALLERGIES: No Known Allergies   PERTINENT MEDICATIONS:  Outpatient Encounter Medications as of 04/18/2020  Medication Sig  . acetaminophen (TYLENOL) 325 MG tablet Take 2 tablets (650 mg total) by mouth every 6 (six) hours as needed for mild pain. (Patient not taking: Reported on 04/06/2020)  . amitriptyline (ELAVIL) 25 MG tablet Take 25 mg by mouth at bedtime. (Patient not taking: Reported on 04/06/2020)  . Calcium Carbonate-Vitamin D (CALCIUM 500 + D PO) Take 1 tablet by mouth daily. (Patient not taking: Reported on 04/06/2020)  . cholecalciferol (VITAMIN D3) 25 MCG (1000 UT) tablet Take 2,000 Units by mouth daily.  (Patient not taking: Reported on 04/06/2020)  . ferrous sulfate 325 (65 FE) MG EC tablet Take 325 mg by mouth daily with breakfast. (Patient not taking: Reported on 04/06/2020)  . gabapentin (NEURONTIN) 100 MG capsule TAKE 1 CAPSULE IN THE MORNING AND TWO AT NIGHT (Patient not taking: Reported on 04/06/2020)  . Multiple Vitamins-Minerals (MULTIVITAMIN ADULT PO) Take 1  tablet by mouth daily. (Patient not taking: Reported on 04/06/2020)  . Polyethylene Glycol 3350 (MIRALAX PO) Take 1 Dose by mouth daily. Heaping teaspoon (Patient not taking: Reported on 04/06/2020)  . sodium chloride 1 g tablet Take 1 tablet (1 g total) by mouth 2 (two) times daily with a meal. (Patient not taking: Reported on 04/06/2020)  . tamsulosin (FLOMAX) 0.4 MG CAPS capsule Take 0.4 mg by mouth daily after breakfast.  (Patient not taking: Reported on 04/06/2020)  . Vitamin E 400 units TABS Take 1 tablet by mouth daily. (Patient not taking: Reported on 04/06/2020)   No facility-administered encounter medications on file as of 04/18/2020.    PHYSICAL EXAM:   General: elderly pleasant  male Cardiovascular: regular rate and rhythm Pulmonary: clear ant fields Neurological: Walks with walker  Amillion [place]ee Ihor Gully, NP

## 2020-04-21 DIAGNOSIS — I70229 Atherosclerosis of native arteries of extremities with rest pain, unspecified extremity: Secondary | ICD-10-CM | POA: Diagnosis not present

## 2020-04-21 DIAGNOSIS — I739 Peripheral vascular disease, unspecified: Secondary | ICD-10-CM | POA: Diagnosis not present

## 2020-04-22 DIAGNOSIS — E261 Secondary hyperaldosteronism: Secondary | ICD-10-CM | POA: Diagnosis not present

## 2020-04-22 DIAGNOSIS — R531 Weakness: Secondary | ICD-10-CM | POA: Diagnosis not present

## 2020-04-22 DIAGNOSIS — I5030 Unspecified diastolic (congestive) heart failure: Secondary | ICD-10-CM | POA: Diagnosis not present

## 2020-04-22 DIAGNOSIS — R2681 Unsteadiness on feet: Secondary | ICD-10-CM | POA: Diagnosis not present

## 2020-04-22 DIAGNOSIS — I739 Peripheral vascular disease, unspecified: Secondary | ICD-10-CM | POA: Diagnosis not present

## 2020-04-22 DIAGNOSIS — I110 Hypertensive heart disease with heart failure: Secondary | ICD-10-CM | POA: Diagnosis not present

## 2020-04-22 DIAGNOSIS — Z950 Presence of cardiac pacemaker: Secondary | ICD-10-CM | POA: Diagnosis not present

## 2020-04-22 DIAGNOSIS — Z7901 Long term (current) use of anticoagulants: Secondary | ICD-10-CM | POA: Diagnosis not present

## 2020-04-22 DIAGNOSIS — I4891 Unspecified atrial fibrillation: Secondary | ICD-10-CM | POA: Diagnosis not present

## 2020-04-23 DIAGNOSIS — I700 Atherosclerosis of aorta: Secondary | ICD-10-CM | POA: Diagnosis not present

## 2020-04-23 DIAGNOSIS — I110 Hypertensive heart disease with heart failure: Secondary | ICD-10-CM | POA: Diagnosis not present

## 2020-04-23 DIAGNOSIS — R2681 Unsteadiness on feet: Secondary | ICD-10-CM | POA: Diagnosis not present

## 2020-04-23 DIAGNOSIS — Z7901 Long term (current) use of anticoagulants: Secondary | ICD-10-CM | POA: Diagnosis not present

## 2020-04-23 DIAGNOSIS — R531 Weakness: Secondary | ICD-10-CM | POA: Diagnosis not present

## 2020-04-23 DIAGNOSIS — Z950 Presence of cardiac pacemaker: Secondary | ICD-10-CM | POA: Diagnosis not present

## 2020-04-23 DIAGNOSIS — I5030 Unspecified diastolic (congestive) heart failure: Secondary | ICD-10-CM | POA: Diagnosis not present

## 2020-04-23 DIAGNOSIS — I739 Peripheral vascular disease, unspecified: Secondary | ICD-10-CM | POA: Diagnosis not present

## 2020-04-23 DIAGNOSIS — E261 Secondary hyperaldosteronism: Secondary | ICD-10-CM | POA: Diagnosis not present

## 2020-04-23 DIAGNOSIS — I4891 Unspecified atrial fibrillation: Secondary | ICD-10-CM | POA: Diagnosis not present

## 2020-04-24 DIAGNOSIS — L853 Xerosis cutis: Secondary | ICD-10-CM | POA: Diagnosis not present

## 2020-04-24 DIAGNOSIS — I495 Sick sinus syndrome: Secondary | ICD-10-CM | POA: Diagnosis not present

## 2020-04-24 DIAGNOSIS — I5022 Chronic systolic (congestive) heart failure: Secondary | ICD-10-CM | POA: Diagnosis not present

## 2020-04-24 DIAGNOSIS — D649 Anemia, unspecified: Secondary | ICD-10-CM | POA: Diagnosis not present

## 2020-04-24 DIAGNOSIS — I4891 Unspecified atrial fibrillation: Secondary | ICD-10-CM | POA: Diagnosis not present

## 2020-04-28 DIAGNOSIS — E871 Hypo-osmolality and hyponatremia: Secondary | ICD-10-CM | POA: Diagnosis not present

## 2020-04-28 DIAGNOSIS — I4891 Unspecified atrial fibrillation: Secondary | ICD-10-CM

## 2020-04-28 DIAGNOSIS — U071 COVID-19: Secondary | ICD-10-CM | POA: Diagnosis not present

## 2020-04-28 DIAGNOSIS — Z7901 Long term (current) use of anticoagulants: Secondary | ICD-10-CM

## 2020-04-28 DIAGNOSIS — I739 Peripheral vascular disease, unspecified: Secondary | ICD-10-CM

## 2020-04-28 DIAGNOSIS — R41841 Cognitive communication deficit: Secondary | ICD-10-CM

## 2020-04-28 DIAGNOSIS — R1310 Dysphagia, unspecified: Secondary | ICD-10-CM | POA: Diagnosis not present

## 2020-04-28 DIAGNOSIS — I110 Hypertensive heart disease with heart failure: Secondary | ICD-10-CM | POA: Diagnosis not present

## 2020-04-28 DIAGNOSIS — Z950 Presence of cardiac pacemaker: Secondary | ICD-10-CM | POA: Diagnosis not present

## 2020-04-28 DIAGNOSIS — R2681 Unsteadiness on feet: Secondary | ICD-10-CM

## 2020-04-28 DIAGNOSIS — I5022 Chronic systolic (congestive) heart failure: Secondary | ICD-10-CM | POA: Diagnosis not present

## 2020-04-28 DIAGNOSIS — M6281 Muscle weakness (generalized): Secondary | ICD-10-CM | POA: Diagnosis not present

## 2020-04-29 DIAGNOSIS — U071 COVID-19: Secondary | ICD-10-CM | POA: Diagnosis not present

## 2020-04-30 DIAGNOSIS — I5022 Chronic systolic (congestive) heart failure: Secondary | ICD-10-CM | POA: Diagnosis not present

## 2020-04-30 DIAGNOSIS — I739 Peripheral vascular disease, unspecified: Secondary | ICD-10-CM | POA: Diagnosis not present

## 2020-04-30 DIAGNOSIS — I4891 Unspecified atrial fibrillation: Secondary | ICD-10-CM | POA: Diagnosis not present

## 2020-04-30 DIAGNOSIS — M6281 Muscle weakness (generalized): Secondary | ICD-10-CM | POA: Diagnosis not present

## 2020-04-30 DIAGNOSIS — R2681 Unsteadiness on feet: Secondary | ICD-10-CM | POA: Diagnosis not present

## 2020-04-30 DIAGNOSIS — R1310 Dysphagia, unspecified: Secondary | ICD-10-CM | POA: Diagnosis not present

## 2020-04-30 DIAGNOSIS — Z7901 Long term (current) use of anticoagulants: Secondary | ICD-10-CM | POA: Diagnosis not present

## 2020-04-30 DIAGNOSIS — I110 Hypertensive heart disease with heart failure: Secondary | ICD-10-CM | POA: Diagnosis not present

## 2020-04-30 DIAGNOSIS — E871 Hypo-osmolality and hyponatremia: Secondary | ICD-10-CM | POA: Diagnosis not present

## 2020-05-01 DIAGNOSIS — M6281 Muscle weakness (generalized): Secondary | ICD-10-CM | POA: Diagnosis not present

## 2020-05-01 DIAGNOSIS — E119 Type 2 diabetes mellitus without complications: Secondary | ICD-10-CM | POA: Diagnosis not present

## 2020-05-01 DIAGNOSIS — R2681 Unsteadiness on feet: Secondary | ICD-10-CM | POA: Diagnosis not present

## 2020-05-01 DIAGNOSIS — I110 Hypertensive heart disease with heart failure: Secondary | ICD-10-CM | POA: Diagnosis not present

## 2020-05-01 DIAGNOSIS — I739 Peripheral vascular disease, unspecified: Secondary | ICD-10-CM | POA: Diagnosis not present

## 2020-05-01 DIAGNOSIS — I4891 Unspecified atrial fibrillation: Secondary | ICD-10-CM | POA: Diagnosis not present

## 2020-05-01 DIAGNOSIS — D518 Other vitamin B12 deficiency anemias: Secondary | ICD-10-CM | POA: Diagnosis not present

## 2020-05-01 DIAGNOSIS — Z79899 Other long term (current) drug therapy: Secondary | ICD-10-CM | POA: Diagnosis not present

## 2020-05-01 DIAGNOSIS — E7849 Other hyperlipidemia: Secondary | ICD-10-CM | POA: Diagnosis not present

## 2020-05-01 DIAGNOSIS — E871 Hypo-osmolality and hyponatremia: Secondary | ICD-10-CM | POA: Diagnosis not present

## 2020-05-01 DIAGNOSIS — I5022 Chronic systolic (congestive) heart failure: Secondary | ICD-10-CM | POA: Diagnosis not present

## 2020-05-01 DIAGNOSIS — E559 Vitamin D deficiency, unspecified: Secondary | ICD-10-CM | POA: Diagnosis not present

## 2020-05-01 DIAGNOSIS — E038 Other specified hypothyroidism: Secondary | ICD-10-CM | POA: Diagnosis not present

## 2020-05-01 DIAGNOSIS — Z7901 Long term (current) use of anticoagulants: Secondary | ICD-10-CM | POA: Diagnosis not present

## 2020-05-01 DIAGNOSIS — R1310 Dysphagia, unspecified: Secondary | ICD-10-CM | POA: Diagnosis not present

## 2020-05-05 DIAGNOSIS — U071 COVID-19: Secondary | ICD-10-CM | POA: Diagnosis not present

## 2020-05-06 DIAGNOSIS — R2681 Unsteadiness on feet: Secondary | ICD-10-CM | POA: Diagnosis not present

## 2020-05-06 DIAGNOSIS — M6281 Muscle weakness (generalized): Secondary | ICD-10-CM | POA: Diagnosis not present

## 2020-05-06 DIAGNOSIS — Z7901 Long term (current) use of anticoagulants: Secondary | ICD-10-CM | POA: Diagnosis not present

## 2020-05-06 DIAGNOSIS — I739 Peripheral vascular disease, unspecified: Secondary | ICD-10-CM | POA: Diagnosis not present

## 2020-05-06 DIAGNOSIS — I4891 Unspecified atrial fibrillation: Secondary | ICD-10-CM | POA: Diagnosis not present

## 2020-05-06 DIAGNOSIS — E871 Hypo-osmolality and hyponatremia: Secondary | ICD-10-CM | POA: Diagnosis not present

## 2020-05-06 DIAGNOSIS — I5022 Chronic systolic (congestive) heart failure: Secondary | ICD-10-CM | POA: Diagnosis not present

## 2020-05-06 DIAGNOSIS — I110 Hypertensive heart disease with heart failure: Secondary | ICD-10-CM | POA: Diagnosis not present

## 2020-05-06 DIAGNOSIS — R1310 Dysphagia, unspecified: Secondary | ICD-10-CM | POA: Diagnosis not present

## 2020-05-07 DIAGNOSIS — M6281 Muscle weakness (generalized): Secondary | ICD-10-CM | POA: Diagnosis not present

## 2020-05-07 DIAGNOSIS — I5022 Chronic systolic (congestive) heart failure: Secondary | ICD-10-CM | POA: Diagnosis not present

## 2020-05-07 DIAGNOSIS — R2681 Unsteadiness on feet: Secondary | ICD-10-CM | POA: Diagnosis not present

## 2020-05-07 DIAGNOSIS — I739 Peripheral vascular disease, unspecified: Secondary | ICD-10-CM | POA: Diagnosis not present

## 2020-05-07 DIAGNOSIS — I4891 Unspecified atrial fibrillation: Secondary | ICD-10-CM | POA: Diagnosis not present

## 2020-05-07 DIAGNOSIS — R1310 Dysphagia, unspecified: Secondary | ICD-10-CM | POA: Diagnosis not present

## 2020-05-07 DIAGNOSIS — I110 Hypertensive heart disease with heart failure: Secondary | ICD-10-CM | POA: Diagnosis not present

## 2020-05-07 DIAGNOSIS — Z7901 Long term (current) use of anticoagulants: Secondary | ICD-10-CM | POA: Diagnosis not present

## 2020-05-07 DIAGNOSIS — E871 Hypo-osmolality and hyponatremia: Secondary | ICD-10-CM | POA: Diagnosis not present

## 2020-05-09 DIAGNOSIS — E871 Hypo-osmolality and hyponatremia: Secondary | ICD-10-CM | POA: Diagnosis not present

## 2020-05-09 DIAGNOSIS — M6281 Muscle weakness (generalized): Secondary | ICD-10-CM | POA: Diagnosis not present

## 2020-05-09 DIAGNOSIS — I5022 Chronic systolic (congestive) heart failure: Secondary | ICD-10-CM | POA: Diagnosis not present

## 2020-05-09 DIAGNOSIS — R1310 Dysphagia, unspecified: Secondary | ICD-10-CM | POA: Diagnosis not present

## 2020-05-09 DIAGNOSIS — Z7901 Long term (current) use of anticoagulants: Secondary | ICD-10-CM | POA: Diagnosis not present

## 2020-05-09 DIAGNOSIS — I4891 Unspecified atrial fibrillation: Secondary | ICD-10-CM | POA: Diagnosis not present

## 2020-05-09 DIAGNOSIS — I110 Hypertensive heart disease with heart failure: Secondary | ICD-10-CM | POA: Diagnosis not present

## 2020-05-09 DIAGNOSIS — I739 Peripheral vascular disease, unspecified: Secondary | ICD-10-CM | POA: Diagnosis not present

## 2020-05-09 DIAGNOSIS — R2681 Unsteadiness on feet: Secondary | ICD-10-CM | POA: Diagnosis not present

## 2020-05-13 DIAGNOSIS — R2681 Unsteadiness on feet: Secondary | ICD-10-CM | POA: Diagnosis not present

## 2020-05-13 DIAGNOSIS — U071 COVID-19: Secondary | ICD-10-CM | POA: Diagnosis not present

## 2020-05-13 DIAGNOSIS — E871 Hypo-osmolality and hyponatremia: Secondary | ICD-10-CM | POA: Diagnosis not present

## 2020-05-13 DIAGNOSIS — I5022 Chronic systolic (congestive) heart failure: Secondary | ICD-10-CM | POA: Diagnosis not present

## 2020-05-13 DIAGNOSIS — Z7901 Long term (current) use of anticoagulants: Secondary | ICD-10-CM | POA: Diagnosis not present

## 2020-05-13 DIAGNOSIS — I739 Peripheral vascular disease, unspecified: Secondary | ICD-10-CM | POA: Diagnosis not present

## 2020-05-13 DIAGNOSIS — I4891 Unspecified atrial fibrillation: Secondary | ICD-10-CM | POA: Diagnosis not present

## 2020-05-13 DIAGNOSIS — I110 Hypertensive heart disease with heart failure: Secondary | ICD-10-CM | POA: Diagnosis not present

## 2020-05-13 DIAGNOSIS — M6281 Muscle weakness (generalized): Secondary | ICD-10-CM | POA: Diagnosis not present

## 2020-05-13 DIAGNOSIS — R1310 Dysphagia, unspecified: Secondary | ICD-10-CM | POA: Diagnosis not present

## 2020-05-14 DIAGNOSIS — I4891 Unspecified atrial fibrillation: Secondary | ICD-10-CM | POA: Diagnosis not present

## 2020-05-14 DIAGNOSIS — I739 Peripheral vascular disease, unspecified: Secondary | ICD-10-CM | POA: Diagnosis not present

## 2020-05-14 DIAGNOSIS — R2681 Unsteadiness on feet: Secondary | ICD-10-CM | POA: Diagnosis not present

## 2020-05-14 DIAGNOSIS — R1310 Dysphagia, unspecified: Secondary | ICD-10-CM | POA: Diagnosis not present

## 2020-05-14 DIAGNOSIS — I5022 Chronic systolic (congestive) heart failure: Secondary | ICD-10-CM | POA: Diagnosis not present

## 2020-05-14 DIAGNOSIS — M6281 Muscle weakness (generalized): Secondary | ICD-10-CM | POA: Diagnosis not present

## 2020-05-14 DIAGNOSIS — I110 Hypertensive heart disease with heart failure: Secondary | ICD-10-CM | POA: Diagnosis not present

## 2020-05-14 DIAGNOSIS — Z7901 Long term (current) use of anticoagulants: Secondary | ICD-10-CM | POA: Diagnosis not present

## 2020-05-14 DIAGNOSIS — E871 Hypo-osmolality and hyponatremia: Secondary | ICD-10-CM | POA: Diagnosis not present

## 2020-05-15 ENCOUNTER — Encounter: Payer: Self-pay | Admitting: Nurse Practitioner

## 2020-05-15 ENCOUNTER — Non-Acute Institutional Stay: Payer: Medicare Other | Admitting: Nurse Practitioner

## 2020-05-15 DIAGNOSIS — D649 Anemia, unspecified: Secondary | ICD-10-CM | POA: Diagnosis not present

## 2020-05-15 DIAGNOSIS — I4891 Unspecified atrial fibrillation: Secondary | ICD-10-CM | POA: Diagnosis not present

## 2020-05-15 DIAGNOSIS — L853 Xerosis cutis: Secondary | ICD-10-CM | POA: Diagnosis not present

## 2020-05-15 DIAGNOSIS — J309 Allergic rhinitis, unspecified: Secondary | ICD-10-CM | POA: Diagnosis not present

## 2020-05-15 DIAGNOSIS — I5022 Chronic systolic (congestive) heart failure: Secondary | ICD-10-CM

## 2020-05-15 DIAGNOSIS — G629 Polyneuropathy, unspecified: Secondary | ICD-10-CM | POA: Diagnosis not present

## 2020-05-15 DIAGNOSIS — I82401 Acute embolism and thrombosis of unspecified deep veins of right lower extremity: Secondary | ICD-10-CM | POA: Diagnosis not present

## 2020-05-15 DIAGNOSIS — R262 Difficulty in walking, not elsewhere classified: Secondary | ICD-10-CM | POA: Diagnosis not present

## 2020-05-15 DIAGNOSIS — Z515 Encounter for palliative care: Secondary | ICD-10-CM | POA: Diagnosis not present

## 2020-05-15 NOTE — Progress Notes (Signed)
[facility] Consult Note Telephone: [phone]  Fax: [phone]  PATIENT NAME: Douglas Calhoun DOB: 1919-12-27 MRN: [id number]  PRIMARY CARE PROVIDER:   Kandis Mannan [facility] RESPONSIBLE PARTY:   Toya Smothers [id number]  1.Advance Care Planning; DNR; DNR and most form completed. MOST form says DNR, limited additional interventions, antibiotics, IV fluids, no feeding tube  2. Goals of Care: Goals include to maximize quality of life and symptom management. Our advance care planning conversation included a discussion about:   The value and importance of advance care planning  Exploration of personal, cultural or spiritual beliefs that might influence medical decisions  Exploration of goals of care in the event of a sudden injury or illness  Identification and preparation of a healthcare agent  Review and updating or creation of anadvance directive document.  3.Palliative care encounter; Palliative care encounter; Palliative medicine team will continue to support patient, patient's family, and medical team. Visit consisted of counseling and education dealing with the complex and emotionally intense issues of symptom management and palliative care in the setting of serious and potentially life-threatening illness  4. f/u 4 weeks for ongoing monitoring dyspnea, work with therapy and possible transition to hospice  I spent 50 minutes providing this consultation, starting at 10:15am. More than 50% of the time in this consultation was spent coordinating communication.   HISTORY OF PRESENT ILLNESS:  Douglas Calhoun is a 87 y.o. year old male with multiple medical problems including Prostate cancer s/p 15 radiation treatments, sick sinus syndrome s/p pacemaker, atrial fibrillation, systolic congestive heart failure, cardiomyopathy, compression fractures of lumbar spine, varicose veins, cataracts, history of herpes zoster, hearing  loss, breast surgery, appendectomy. Hospitalize 12 / 12 / 2021 to 12 / 16 / 2021 for evaluation of mental status changes. Eliquis was discontinued on 10/8 / 2021 by Cardiology due to recent falls, congestive heart failure with last EF 25% on 10 / 01/2019. Discharge to assisted living facility with Home Health. Back to baseline upon discharge. Douglas Calhoun continues to reside at [facility]. Douglas Calhoun does continue to walk with a walker. Staff endorses Douglas. Calhoun has become stronger, continues to work with therapy. Douglas Calhoun does perform adl's, toilets himself. Douglas Calhoun feeds himself and goes to the dining area for his meals. Staff endorses appetite has been fair to good depending on what is being served. No recent wounds, hospitalization. Staff endorses Douglas Calhoun has been having some trouble with his bowel movements and bowel regimen has been adjusted. Douglas. Calhoun does have some coughing with eating. Speech consult pending. Douglas. Calhoun currently does have a blood clot for which he is receiving treatment for. Douglas Calhoun Nurse-Practitioner just started on antibiotic for cellulitis left lower extremity as his lower left leg is erythema with warm. At present Douglas Calhoun is sitting in the chair in his room. Douglas Calhoun does a comfortable. No visitors present. I visited and observed Douglas. Calhoun. We talked about purpose of Palliative care visit. Douglas Calhoun in agreement. We talked about symptoms of pain. Douglas. Calhoun does have intermittently and his lower leg but that has improved. We talked about ambulating with the walker. Douglas Calhoun endorses he goes outside daily to get his walk-in. We talked about working with physical therapy which he continues to do. Praised Douglas Gust for his progress with increasing in endurance. We talked about shortness of breath that he does experience intermittent. Currently Douglas. Calhoun is not on oxygen. We  talked about continuing to acclimate to Assisted Living facility from home. Douglas Calhoun talked about the barriers and  concerns that he has. We talked about the challenges involved in facility living. Douglas Calhoun talked about continuing to have his grandson bring some of his things. We talked about the computer as he does not have that up and running yet. We talked about quality of life. Douglas. Calhoun talked about his daily routine. Douglas Calhoun had a bunch of biscuits in the basket of his walker. Douglas. Calhoun talked about using those biscuits and collecting them so he can provide food for the animals outside. Douglas. Calhoun talked at length about going to see the animals, the donkeys on the premises. We talked about person with ambulating and fall risk. Douglas Calhoun verbalized understanding. Douglas Calhoun share that he is extremely cautious when ambulating. We talked about medical goals of care. We talked about last Palliative care visit which he has seemed to improve. We talked about role of Palliative care in plan of care. We talked about follow up Palliative care visit in 4 weeks if needed or soon or should he declined. Douglas Calhoun in agreement. Will contact his grandson for update on Palliative care visit. Douglas. Calhoun appears to have improved and will hold off with Hospice as he does not appear to be as symptomatic and he continues benefit from his work with therapy. I have updated staff.  Palliative Care was asked to help to continue to address goals of care.   CODE STATUS: DNR  PPS: 50% HOSPICE ELIGIBILITY/DIAGNOSIS: TBD  PAST MEDICAL HISTORY:  Past Medical History:  Diagnosis Date  . Achilles tendinitis   . Cardiomyopathy ([place])    a. 01/2019 Echo: EF 25%, glob HK.  . Cataract   . Chronic HFrEF (heart failure with reduced ejection fraction) ([place])    a. 03/2018 Echo: EF 45-50%); b. 01/2019 Echo: EF 25%. Glob HK. Triv AI/PR, Mod TR. RVSP 57.57mmHg.  Marland Kitchen Hearing loss   . Herpes zoster without complication [id number]  . Permanent atrial fibrillation (HCC)    a. CHA2DS2VASc = 4-->Eliquis.  . Personal history of prostate cancer 10/08/2014   s/p 15  radiation treatments treated by Dr. Madelin Headings   . Prostate cancer ([place]) 03/14/2020  . SSS (sick sinus syndrome) (HCC)    a. s/p MDT Micra AV leadless PPM (ser # [id number]).  . Varicose vein of leg     SOCIAL HX:  Social History   Tobacco Use  . Smoking status: Never Smoker  . Smokeless tobacco: Never Used  Substance Use Topics  . Alcohol use: Not Currently    Alcohol/week: 0.0 standard drinks    ALLERGIES: No Known Allergies   PERTINENT MEDICATIONS:  Outpatient Encounter Medications as of 05/15/2020  Medication Sig  . acetaminophen (TYLENOL) 325 MG tablet Take 2 tablets (650 mg total) by mouth every 6 (six) hours as needed for mild pain. (Patient not taking: Reported on 04/06/2020)  . amitriptyline (ELAVIL) 25 MG tablet Take 25 mg by mouth at bedtime. (Patient not taking: Reported on 04/06/2020)  . Calcium Carbonate-Vitamin D (CALCIUM 500 + D PO) Take 1 tablet by mouth daily. (Patient not taking: Reported on 04/06/2020)  . cholecalciferol (VITAMIN D3) 25 MCG (1000 UT) tablet Take 2,000 Units by mouth daily.  (Patient not taking: Reported on 04/06/2020)  . ferrous sulfate 325 (65 FE) MG EC tablet Take 325 mg by mouth daily with breakfast. (Patient not taking: Reported on 04/06/2020)  . gabapentin (NEURONTIN) 100 MG capsule  TAKE 1 CAPSULE IN THE MORNING AND TWO AT NIGHT (Patient not taking: Reported on 04/06/2020)  . Multiple Vitamins-Minerals (MULTIVITAMIN ADULT PO) Take 1 tablet by mouth daily. (Patient not taking: Reported on 04/06/2020)  . Polyethylene Glycol 3350 (MIRALAX PO) Take 1 Dose by mouth daily. Heaping teaspoon (Patient not taking: Reported on 04/06/2020)  . sodium chloride 1 g tablet Take 1 tablet (1 g total) by mouth 2 (two) times daily with a meal. (Patient not taking: Reported on 04/06/2020)  . tamsulosin (FLOMAX) 0.4 MG CAPS capsule Take 0.4 mg by mouth daily after breakfast.  (Patient not taking: Reported on 04/06/2020)  . Vitamin E 400 units TABS Take 1 tablet by mouth  daily. (Patient not taking: Reported on 04/06/2020)   No facility-administered encounter medications on file as of 05/15/2020.    PHYSICAL EXAM:   General: NAD, pleasant male Cardiovascular: regular rate and rhythm Pulmonary: clear ant fields Neurological: Walks with walker  Christin Ihor Gully, NP

## 2020-05-16 ENCOUNTER — Telehealth: Payer: Self-pay | Admitting: Nurse Practitioner

## 2020-05-16 ENCOUNTER — Other Ambulatory Visit: Payer: Self-pay

## 2020-05-16 ENCOUNTER — Encounter: Payer: Self-pay | Admitting: Cardiology

## 2020-05-16 ENCOUNTER — Ambulatory Visit: Payer: Medicare Other | Admitting: Cardiology

## 2020-05-16 VITALS — BP 106/58 | HR 96 | Ht 67.0 in | Wt 178.0 lb

## 2020-05-16 DIAGNOSIS — I495 Sick sinus syndrome: Secondary | ICD-10-CM

## 2020-05-16 DIAGNOSIS — I4821 Permanent atrial fibrillation: Secondary | ICD-10-CM | POA: Diagnosis not present

## 2020-05-16 DIAGNOSIS — I5022 Chronic systolic (congestive) heart failure: Secondary | ICD-10-CM | POA: Diagnosis not present

## 2020-05-16 NOTE — Telephone Encounter (Signed)
I called Mr. Douglas Calhoun, Mr. Douglas Calhoun. Clinical update given. We talked about Palliative visit with Mr. Douglas Calhoun. We talked about work with therapy, will continue. We talked about Mr. Douglas Calhoun doing better with ambulating, adjusting to ALF. We talked about appetite, rolls in the basket he takes to the animals outside. We talked about quality of life. We talked about medical goals. Discussed will continue with therapy hold off on hospice with improvement. We talked about antibiotic started yesterday for left lower leg cellulitis by pcp, blood clot, cardiology appointment today, pending speech therapy consult for coughing while eating noted. Also discussed will have facility follow O2 sats, watching for desaturations, may benefit from O2 if meets criteria. We talked about role of PC in POC. We talked about f/u PC visit in 4 weeks if needed or sooner if declines. Mr. Douglas Calhoun in agreement. Therapeutic listening, emotional support provided. Questions answered to satisfaction. Contact infomration  Total time 15 minutes Documentation 5 minutes Phone discussion 10 minutes

## 2020-05-16 NOTE — Progress Notes (Signed)
Cardiology Office Note:    Date:  05/16/2020   ID:  Douglas Calhoun, DOB 08-05-1919, MRN [id number]  PCP:  Birdie Sons, MD  Cardiologist:  Kate Sable, MD  Electrophysiologist:  None   Referring MD: Birdie Sons, MD   Chief Complaint  Patient presents with   Follow-up    ED visit for SOB and 2 month f/u     History of Present Illness:    Douglas Calhoun is a 87 y.o. male with a hx of atrial fibrillation on Eliquis, sick sinus syndrome status post leadless pacemaker 01/11/2019,  heart failure reduced ejection fraction, last EF 25% on 02/05/2019, who presents for follow-up.    He is being seen for A. fib, congestive heart failure and lower extremity edema.  Eliquis previously due to fall risk.  Stopped after last visit due to frequent falls.  Low blood pressures have prevented implementation of heart failure meds.  Admitted on 04/06/2020 due to altered mental status and confusion.  He apparently stopped taking his medications 2 days prior.  Currently lives in a retirement home.  Has stopped Monday through Friday.  Patient was noted to be hypokalemic with potassium less than 2, and hyponatremic sodium around 122.  Was given salt tablets, diuretics including torsemide and metolazone were held.   Patient was eventually discharged to assisted living facility.  Recently diagnosed with left lower extremity DVT at the assisted living facility.  Started on Eliquis 5 mg twice daily.  Also started on Lasix 40 mg daily.  Weight has been stable around 178 pounds  Historical notes Patient was diagnosed with lumbar compression fracture in November 2020.  He was admitted at [facility].  Surgery was not performed due to his age and medical management recommended.  He was noted to be fluid overloaded at the time and was adequately diuresed.  On discharge, his weight was 180 pounds.   He has urinary obstruction and self caths themselves.  He denies any history of heart attacks or CAD.  Last  echocardiogram date 02/05/2019 revealed severely reduced ejection fraction with EF 25%.  Moderately dilated left ventricle, moderately dilated right ventricle.  Patient with recently having unsteady gait.  Has fallen 2 times over the past several weeks.  His neighbor saw him hitting his head.  Has a bruise in his right elbow.   Past Medical History:  Diagnosis Date   Achilles tendinitis    Cardiomyopathy ([place])    a. 01/2019 Echo: EF 25%, glob HK.   Cataract    Chronic HFrEF (heart failure with reduced ejection fraction) ([place])    a. 03/2018 Echo: EF 45-50%); b. 01/2019 Echo: EF 25%. Glob HK. Triv AI/PR, Mod TR. RVSP 57.78mmHg.   Hearing loss    Herpes zoster without complication [id number]   Permanent atrial fibrillation (HCC)    a. CHA2DS2VASc = 4-->Eliquis.   Personal history of prostate cancer 10/08/2014   s/p 15 radiation treatments treated by Dr. Madelin Headings    Prostate cancer [facility]) 03/14/2020   SSS (sick sinus syndrome) ([place])    a. s/p MDT Micra AV leadless PPM (ser # [id number]).   Varicose vein of leg     Past Surgical History:  Procedure Laterality Date   APPENDECTOMY  1960's   BREAST SURGERY     Breast Biopsy prior to [place] N/A 01/11/2019   Procedure: PACEMAKER LEADLESS INSERTION;  Surgeon: Isaias Cowman, MD;  Location: [facility];  Service: Cardiovascular;  Laterality: N/A;    Current Medications: Current Meds  Medication Sig   acetaminophen (TYLENOL) 325 MG tablet Take 2 tablets (650 mg total) by mouth every 6 (six) hours as needed for mild pain.   albuterol (VENTOLIN HFA) 108 (90 Base) MCG/ACT inhaler Inhale 2 puffs into the lungs every 6 (six) hours as needed for shortness of breath.   amitriptyline (ELAVIL) 25 MG tablet Take 25 mg by mouth at bedtime.   azelastine (ASTELIN) 0.1 % nasal spray Place into both nostrils.   Calcium Carbonate-Vitamin D (CALCIUM 500 + D PO) Take 1  tablet by mouth daily.   cephALEXin (KEFLEX) 500 MG capsule Take 500 mg by mouth 4 (four) times daily. For 5 days (End: 05/20/20)   cholecalciferol (VITAMIN D3) 25 MCG (1000 UT) tablet Take 2,000 Units by mouth daily.   docusate sodium (COLACE) 100 MG capsule Take 100 mg by mouth daily.   ELIQUIS 5 MG TABS tablet Take 5 mg by mouth 2 (two) times daily.   ferrous sulfate 325 (65 FE) MG EC tablet Take 325 mg by mouth daily with breakfast.   furosemide (LASIX) 20 MG tablet Take 20 mg by mouth daily. For 3 days (End: 05/18/20)   furosemide (LASIX) 40 MG tablet Take 40 mg by mouth daily.   gabapentin (NEURONTIN) 100 MG capsule TAKE 1 CAPSULE IN THE MORNING AND TWO AT NIGHT   Multiple Vitamins-Minerals (MULTIVITAMIN ADULT PO) Take 1 tablet by mouth daily.   Polyethylene Glycol 3350 (MIRALAX PO) Take 1 Dose by mouth daily. Heaping teaspoon   sodium chloride 1 g tablet Take 1 tablet (1 g total) by mouth 2 (two) times daily with a meal.   tamsulosin (FLOMAX) 0.4 MG CAPS capsule Take 0.4 mg by mouth daily after breakfast.   Vitamin E 400 units TABS Take 1 tablet by mouth daily.     Allergies:   Patient has no known allergies.   Social History   Socioeconomic History   Marital status: Legally Separated    Spouse name: Not on file   Number of children: 1   Years of education: Not on file   Highest education level: Bachelor's degree (e.g., BA, AB, BS)  Occupational History   Occupation: retired  Tobacco Use   Smoking status: Never Smoker   Smokeless tobacco: Never Used  Scientific laboratory technician Use: Never used  Substance and Sexual Activity   Alcohol use: Not Currently    Alcohol/week: 0.0 standard drinks   Drug use: No   Sexual activity: Not on file  Other Topics Concern   Not on file  Social History Narrative   Lives along at [place] Strain: Low Risk    Difficulty of Paying Living Expenses: Not hard at all  Food  Insecurity: No Food Insecurity   Worried About Charity fundraiser in the Last Year: Never true   Arboriculturist in the Last Year: Never true  Transportation Needs: No Transportation Needs   Lack of Transportation (Medical): No   Lack of Transportation (Non-Medical): No  Physical Activity: Inactive   Days of Exercise per Week: 0 days   Minutes of Exercise per Session: 0 min  Stress: No Stress Concern Present   Feeling of Stress : Not at all  Social Connections: Socially Isolated   Frequency of Communication with Friends and Family: Once a week   Frequency of Social Gatherings with Friends and  Family: More than three times a week   Attends Religious Services: Never   Active Member of Clubs or Organizations: No   Attends Archivist Meetings: Never   Marital Status: Separated     Family History: The patient's family history includes Alcoholism in his son; Heart disease in his mother; Stroke in his mother.  ROS:   Please see the history of present illness.     All other systems reviewed and are negative.  EKGs/Labs/Other Studies Reviewed:    The following studies were reviewed today: TTEchocardiogram date 02/05/2019 revealed severely reduced ejection fraction with EF 25%.   Moderately dilated left ventricle,  moderately dilated right ventricle. Moderate MR Moderate TR  EKG:  EKG is obtained today. ekg shows atrial fib HR 96  Recent Labs: 02/02/2020: ALT 28 04/06/2020: B Natriuretic Peptide 108.7; TSH 7.167 04/08/2020: Magnesium 2.4 04/09/2020: BUN 35; Creatinine, Ser 0.92; Hemoglobin 12.4; Platelets 193; Potassium 3.8; Sodium 127  Recent Lipid Panel    Component Value Date/Time   CHOL 231 (A) 03/06/2013 0000   TRIG 98 03/06/2013 0000   HDL 78 (A) 03/06/2013 0000   LDLCALC 133 03/06/2013 0000    Physical Exam:    VS:  BP (!) 106/58    Pulse 96    Ht 5\' 7"  (1.702 m)    Wt 178 lb (80.7 kg)    BMI 27.88 kg/m     Wt Readings from Last 3  Encounters:  05/16/20 178 lb (80.7 kg)  04/06/20 170 lb (77.1 kg)  03/14/20 191 lb (86.6 kg)     GEN:  Well nourished, well developed in mild distress due to back pain, frail looking HEENT: Normal NECK: No JVD; No carotid bruits LYMPHATICS: No lymphadenopathy CARDIAC: Irregular irregular, no murmurs, rubs, gallops RESPIRATORY: Decreased breath sounds at bases ABDOMEN: Soft, non-tender, distended MUSCULOSKELETAL: 1+ edema, tender to palpation of LL calf  SKIN: Warm and dry NEUROLOGIC:  Alert and oriented x 3 PSYCHIATRIC:  Normal affect    ASSESSMENT:    1. Chronic systolic heart failure ([place])   2. Permanent atrial fibrillation ([place])   3. SSS (sick sinus syndrome) (HCC)    PLAN:    In order of problems listed above:  1. Patient with history of severely reduced EF, last EF 25%.  Not on CHF meds due to low blood pressures.  Lasix 40 mg daily was restarted at ALF.  We will check BMP today.  Recommend checking BMP at ALF at least once a month.  Repeat potassium as needed.  Weight of 178 pounds okay. 2. Permanent atrial fibrillation.  Heart rate controlled, not on anticoagulation due to falls.  Recently placed on Eliquis due to left calf tenderness/DVT 3. Sick sinus syndrome s/p leadless pacemaker.  Monitored by device clinic.  Follow-up in 6 months.  This note was generated in part or whole with voice recognition software. Voice recognition is usually quite accurate but there are transcription errors that can and very often do occur. I apologize for any typographical errors that were not detected and corrected.  Medication Adjustments/Labs and Tests Ordered: Current medicines are reviewed at length with the patient today.  Concerns regarding medicines are outlined above.  Orders Placed This Encounter  Procedures   Basic metabolic panel   EKG [id number]   No orders of the defined types were placed in this encounter.   Patient Instructions  Medication Instructions:  Your  physician recommends that you continue on your current medications as directed. Please refer to  the Current Medication list given to you today.  *If you need a refill on your cardiac medications before your next appointment, please call your pharmacy*   Lab Work: None Ordered If you have labs (blood work) drawn today and your tests are completely normal, you will receive your results only by:  [place] (if you have MyChart) OR  A paper copy in the mail If you have any lab test that is abnormal or we need to change your treatment, we will call you to review the results.   Testing/Procedures: None Ordered   Follow-Up: At [facility], you and your health needs are our priority.  As part of our continuing mission to provide you with exceptional heart care, we have created designated Provider Care Teams.  These Care Teams include your primary Cardiologist (physician) and Advanced Practice Providers (APPs -  Physician Assistants and Nurse Practitioners) who all work together to provide you with the care you need, when you need it.  We recommend signing up for the patient portal called "MyChart".  Sign up information is provided on this After Visit Summary.  MyChart is used to connect with patients for Virtual Visits (Telemedicine).  Patients are able to view lab/test results, encounter notes, upcoming appointments, etc.  Non-urgent messages can be sent to your provider as well.   To learn more about what you can do with MyChart, go to [email or web address].    Your next appointment:   6 month(s)  The format for your next appointment:   In Person  Provider:   Kate Sable, MD   Other Instructions      Signed, Kate Sable, MD  05/16/2020 5:06 PM    [place]

## 2020-05-16 NOTE — Patient Instructions (Signed)

## 2020-05-17 LAB — BASIC METABOLIC PANEL
BUN/Creatinine Ratio: 26 — ABNORMAL HIGH (ref 10–24)
BUN: 23 mg/dL (ref 10–36)
CO2: 21 mmol/L (ref 20–29)
Calcium: 9.5 mg/dL (ref 8.6–10.2)
Chloride: 99 mmol/L (ref 96–106)
Creatinine, Ser: 0.89 mg/dL (ref 0.76–1.27)
GFR calc Af Amer: 82 mL/min/{1.73_m2} (ref >59)
GFR calc non Af Amer: 71 mL/min/{1.73_m2} (ref >59)
Glucose: 118 mg/dL — ABNORMAL HIGH (ref 65–99)
Potassium: 4.5 mmol/L (ref 3.5–5.2)
Sodium: 137 mmol/L (ref 134–144)

## 2020-05-19 DIAGNOSIS — M6281 Muscle weakness (generalized): Secondary | ICD-10-CM | POA: Diagnosis not present

## 2020-05-19 DIAGNOSIS — I5022 Chronic systolic (congestive) heart failure: Secondary | ICD-10-CM | POA: Diagnosis not present

## 2020-05-19 DIAGNOSIS — I4891 Unspecified atrial fibrillation: Secondary | ICD-10-CM | POA: Diagnosis not present

## 2020-05-19 DIAGNOSIS — U071 COVID-19: Secondary | ICD-10-CM | POA: Diagnosis not present

## 2020-05-19 DIAGNOSIS — Z7901 Long term (current) use of anticoagulants: Secondary | ICD-10-CM | POA: Diagnosis not present

## 2020-05-19 DIAGNOSIS — I110 Hypertensive heart disease with heart failure: Secondary | ICD-10-CM | POA: Diagnosis not present

## 2020-05-19 DIAGNOSIS — E871 Hypo-osmolality and hyponatremia: Secondary | ICD-10-CM | POA: Diagnosis not present

## 2020-05-19 DIAGNOSIS — R2681 Unsteadiness on feet: Secondary | ICD-10-CM | POA: Diagnosis not present

## 2020-05-19 DIAGNOSIS — R1310 Dysphagia, unspecified: Secondary | ICD-10-CM | POA: Diagnosis not present

## 2020-05-19 DIAGNOSIS — I739 Peripheral vascular disease, unspecified: Secondary | ICD-10-CM | POA: Diagnosis not present

## 2020-05-20 DIAGNOSIS — E7849 Other hyperlipidemia: Secondary | ICD-10-CM | POA: Diagnosis not present

## 2020-05-20 DIAGNOSIS — D518 Other vitamin B12 deficiency anemias: Secondary | ICD-10-CM | POA: Diagnosis not present

## 2020-05-20 DIAGNOSIS — E119 Type 2 diabetes mellitus without complications: Secondary | ICD-10-CM | POA: Diagnosis not present

## 2020-05-20 DIAGNOSIS — Z79899 Other long term (current) drug therapy: Secondary | ICD-10-CM | POA: Diagnosis not present

## 2020-05-22 DIAGNOSIS — I4891 Unspecified atrial fibrillation: Secondary | ICD-10-CM | POA: Diagnosis not present

## 2020-05-22 DIAGNOSIS — I739 Peripheral vascular disease, unspecified: Secondary | ICD-10-CM | POA: Diagnosis not present

## 2020-05-22 DIAGNOSIS — M6281 Muscle weakness (generalized): Secondary | ICD-10-CM | POA: Diagnosis not present

## 2020-05-22 DIAGNOSIS — E871 Hypo-osmolality and hyponatremia: Secondary | ICD-10-CM | POA: Diagnosis not present

## 2020-05-22 DIAGNOSIS — Z7901 Long term (current) use of anticoagulants: Secondary | ICD-10-CM | POA: Diagnosis not present

## 2020-05-22 DIAGNOSIS — I110 Hypertensive heart disease with heart failure: Secondary | ICD-10-CM | POA: Diagnosis not present

## 2020-05-22 DIAGNOSIS — R2681 Unsteadiness on feet: Secondary | ICD-10-CM | POA: Diagnosis not present

## 2020-05-22 DIAGNOSIS — I5022 Chronic systolic (congestive) heart failure: Secondary | ICD-10-CM | POA: Diagnosis not present

## 2020-05-22 DIAGNOSIS — R1310 Dysphagia, unspecified: Secondary | ICD-10-CM | POA: Diagnosis not present

## 2020-05-26 DIAGNOSIS — I5022 Chronic systolic (congestive) heart failure: Secondary | ICD-10-CM | POA: Diagnosis not present

## 2020-05-26 DIAGNOSIS — U071 COVID-19: Secondary | ICD-10-CM | POA: Diagnosis not present

## 2020-05-26 DIAGNOSIS — R2681 Unsteadiness on feet: Secondary | ICD-10-CM | POA: Diagnosis not present

## 2020-05-26 DIAGNOSIS — E038 Other specified hypothyroidism: Secondary | ICD-10-CM | POA: Diagnosis not present

## 2020-05-26 DIAGNOSIS — D649 Anemia, unspecified: Secondary | ICD-10-CM | POA: Diagnosis not present

## 2020-05-26 DIAGNOSIS — I4891 Unspecified atrial fibrillation: Secondary | ICD-10-CM | POA: Diagnosis not present

## 2020-05-26 DIAGNOSIS — I110 Hypertensive heart disease with heart failure: Secondary | ICD-10-CM | POA: Diagnosis not present

## 2020-05-26 DIAGNOSIS — E871 Hypo-osmolality and hyponatremia: Secondary | ICD-10-CM | POA: Diagnosis not present

## 2020-05-26 DIAGNOSIS — E559 Vitamin D deficiency, unspecified: Secondary | ICD-10-CM | POA: Diagnosis not present

## 2020-05-26 DIAGNOSIS — R1310 Dysphagia, unspecified: Secondary | ICD-10-CM | POA: Diagnosis not present

## 2020-05-26 DIAGNOSIS — I739 Peripheral vascular disease, unspecified: Secondary | ICD-10-CM | POA: Diagnosis not present

## 2020-05-26 DIAGNOSIS — M6281 Muscle weakness (generalized): Secondary | ICD-10-CM | POA: Diagnosis not present

## 2020-05-26 DIAGNOSIS — D518 Other vitamin B12 deficiency anemias: Secondary | ICD-10-CM | POA: Diagnosis not present

## 2020-05-26 DIAGNOSIS — Z7901 Long term (current) use of anticoagulants: Secondary | ICD-10-CM | POA: Diagnosis not present

## 2020-05-26 DIAGNOSIS — E119 Type 2 diabetes mellitus without complications: Secondary | ICD-10-CM | POA: Diagnosis not present

## 2020-05-28 DIAGNOSIS — R2681 Unsteadiness on feet: Secondary | ICD-10-CM | POA: Diagnosis not present

## 2020-05-28 DIAGNOSIS — Z7901 Long term (current) use of anticoagulants: Secondary | ICD-10-CM | POA: Diagnosis not present

## 2020-05-28 DIAGNOSIS — I739 Peripheral vascular disease, unspecified: Secondary | ICD-10-CM | POA: Diagnosis not present

## 2020-05-28 DIAGNOSIS — E871 Hypo-osmolality and hyponatremia: Secondary | ICD-10-CM | POA: Diagnosis not present

## 2020-05-28 DIAGNOSIS — I5022 Chronic systolic (congestive) heart failure: Secondary | ICD-10-CM | POA: Diagnosis not present

## 2020-05-28 DIAGNOSIS — I110 Hypertensive heart disease with heart failure: Secondary | ICD-10-CM | POA: Diagnosis not present

## 2020-05-28 DIAGNOSIS — M6281 Muscle weakness (generalized): Secondary | ICD-10-CM | POA: Diagnosis not present

## 2020-05-28 DIAGNOSIS — R1310 Dysphagia, unspecified: Secondary | ICD-10-CM | POA: Diagnosis not present

## 2020-05-28 DIAGNOSIS — I4891 Unspecified atrial fibrillation: Secondary | ICD-10-CM | POA: Diagnosis not present

## 2020-05-30 DIAGNOSIS — I5022 Chronic systolic (congestive) heart failure: Secondary | ICD-10-CM | POA: Diagnosis not present

## 2020-05-30 DIAGNOSIS — I4891 Unspecified atrial fibrillation: Secondary | ICD-10-CM | POA: Diagnosis not present

## 2020-06-02 DIAGNOSIS — U071 COVID-19: Secondary | ICD-10-CM | POA: Diagnosis not present

## 2020-06-04 DIAGNOSIS — I110 Hypertensive heart disease with heart failure: Secondary | ICD-10-CM | POA: Diagnosis not present

## 2020-06-04 DIAGNOSIS — Z7901 Long term (current) use of anticoagulants: Secondary | ICD-10-CM | POA: Diagnosis not present

## 2020-06-04 DIAGNOSIS — E871 Hypo-osmolality and hyponatremia: Secondary | ICD-10-CM | POA: Diagnosis not present

## 2020-06-04 DIAGNOSIS — R1310 Dysphagia, unspecified: Secondary | ICD-10-CM | POA: Diagnosis not present

## 2020-06-04 DIAGNOSIS — M6281 Muscle weakness (generalized): Secondary | ICD-10-CM | POA: Diagnosis not present

## 2020-06-04 DIAGNOSIS — I4891 Unspecified atrial fibrillation: Secondary | ICD-10-CM | POA: Diagnosis not present

## 2020-06-04 DIAGNOSIS — R2681 Unsteadiness on feet: Secondary | ICD-10-CM | POA: Diagnosis not present

## 2020-06-04 DIAGNOSIS — I5022 Chronic systolic (congestive) heart failure: Secondary | ICD-10-CM | POA: Diagnosis not present

## 2020-06-04 DIAGNOSIS — I739 Peripheral vascular disease, unspecified: Secondary | ICD-10-CM | POA: Diagnosis not present

## 2020-06-05 ENCOUNTER — Other Ambulatory Visit: Payer: Self-pay | Admitting: Family Medicine

## 2020-06-05 DIAGNOSIS — L853 Xerosis cutis: Secondary | ICD-10-CM | POA: Diagnosis not present

## 2020-06-05 DIAGNOSIS — I495 Sick sinus syndrome: Secondary | ICD-10-CM | POA: Diagnosis not present

## 2020-06-05 DIAGNOSIS — D649 Anemia, unspecified: Secondary | ICD-10-CM | POA: Diagnosis not present

## 2020-06-05 DIAGNOSIS — R1310 Dysphagia, unspecified: Secondary | ICD-10-CM | POA: Diagnosis not present

## 2020-06-05 DIAGNOSIS — R262 Difficulty in walking, not elsewhere classified: Secondary | ICD-10-CM | POA: Diagnosis not present

## 2020-06-05 DIAGNOSIS — I110 Hypertensive heart disease with heart failure: Secondary | ICD-10-CM | POA: Diagnosis not present

## 2020-06-05 DIAGNOSIS — R531 Weakness: Secondary | ICD-10-CM | POA: Diagnosis not present

## 2020-06-05 DIAGNOSIS — R2681 Unsteadiness on feet: Secondary | ICD-10-CM | POA: Diagnosis not present

## 2020-06-05 DIAGNOSIS — I5022 Chronic systolic (congestive) heart failure: Secondary | ICD-10-CM | POA: Diagnosis not present

## 2020-06-05 DIAGNOSIS — I739 Peripheral vascular disease, unspecified: Secondary | ICD-10-CM | POA: Diagnosis not present

## 2020-06-05 DIAGNOSIS — R1312 Dysphagia, oropharyngeal phase: Secondary | ICD-10-CM

## 2020-06-05 DIAGNOSIS — I4891 Unspecified atrial fibrillation: Secondary | ICD-10-CM | POA: Diagnosis not present

## 2020-06-05 DIAGNOSIS — M6281 Muscle weakness (generalized): Secondary | ICD-10-CM | POA: Diagnosis not present

## 2020-06-05 DIAGNOSIS — E871 Hypo-osmolality and hyponatremia: Secondary | ICD-10-CM | POA: Diagnosis not present

## 2020-06-05 DIAGNOSIS — Z7901 Long term (current) use of anticoagulants: Secondary | ICD-10-CM | POA: Diagnosis not present

## 2020-06-09 DIAGNOSIS — R2681 Unsteadiness on feet: Secondary | ICD-10-CM | POA: Diagnosis not present

## 2020-06-09 DIAGNOSIS — R1310 Dysphagia, unspecified: Secondary | ICD-10-CM | POA: Diagnosis not present

## 2020-06-09 DIAGNOSIS — I4891 Unspecified atrial fibrillation: Secondary | ICD-10-CM | POA: Diagnosis not present

## 2020-06-09 DIAGNOSIS — M6281 Muscle weakness (generalized): Secondary | ICD-10-CM | POA: Diagnosis not present

## 2020-06-09 DIAGNOSIS — I739 Peripheral vascular disease, unspecified: Secondary | ICD-10-CM | POA: Diagnosis not present

## 2020-06-09 DIAGNOSIS — E871 Hypo-osmolality and hyponatremia: Secondary | ICD-10-CM | POA: Diagnosis not present

## 2020-06-09 DIAGNOSIS — Z7901 Long term (current) use of anticoagulants: Secondary | ICD-10-CM | POA: Diagnosis not present

## 2020-06-09 DIAGNOSIS — U071 COVID-19: Secondary | ICD-10-CM | POA: Diagnosis not present

## 2020-06-09 DIAGNOSIS — I110 Hypertensive heart disease with heart failure: Secondary | ICD-10-CM | POA: Diagnosis not present

## 2020-06-09 DIAGNOSIS — I5022 Chronic systolic (congestive) heart failure: Secondary | ICD-10-CM | POA: Diagnosis not present

## 2020-06-10 DIAGNOSIS — M6281 Muscle weakness (generalized): Secondary | ICD-10-CM | POA: Diagnosis not present

## 2020-06-10 DIAGNOSIS — E871 Hypo-osmolality and hyponatremia: Secondary | ICD-10-CM | POA: Diagnosis not present

## 2020-06-10 DIAGNOSIS — I4891 Unspecified atrial fibrillation: Secondary | ICD-10-CM | POA: Diagnosis not present

## 2020-06-10 DIAGNOSIS — R1310 Dysphagia, unspecified: Secondary | ICD-10-CM | POA: Diagnosis not present

## 2020-06-10 DIAGNOSIS — I5022 Chronic systolic (congestive) heart failure: Secondary | ICD-10-CM | POA: Diagnosis not present

## 2020-06-10 DIAGNOSIS — R2681 Unsteadiness on feet: Secondary | ICD-10-CM | POA: Diagnosis not present

## 2020-06-10 DIAGNOSIS — I110 Hypertensive heart disease with heart failure: Secondary | ICD-10-CM | POA: Diagnosis not present

## 2020-06-10 DIAGNOSIS — I739 Peripheral vascular disease, unspecified: Secondary | ICD-10-CM | POA: Diagnosis not present

## 2020-06-10 DIAGNOSIS — Z7901 Long term (current) use of anticoagulants: Secondary | ICD-10-CM | POA: Diagnosis not present

## 2020-06-16 DIAGNOSIS — U071 COVID-19: Secondary | ICD-10-CM | POA: Diagnosis not present

## 2020-06-17 DIAGNOSIS — M6281 Muscle weakness (generalized): Secondary | ICD-10-CM | POA: Diagnosis not present

## 2020-06-17 DIAGNOSIS — I5022 Chronic systolic (congestive) heart failure: Secondary | ICD-10-CM | POA: Diagnosis not present

## 2020-06-17 DIAGNOSIS — I4891 Unspecified atrial fibrillation: Secondary | ICD-10-CM | POA: Diagnosis not present

## 2020-06-17 DIAGNOSIS — E871 Hypo-osmolality and hyponatremia: Secondary | ICD-10-CM | POA: Diagnosis not present

## 2020-06-17 DIAGNOSIS — R2681 Unsteadiness on feet: Secondary | ICD-10-CM | POA: Diagnosis not present

## 2020-06-17 DIAGNOSIS — I110 Hypertensive heart disease with heart failure: Secondary | ICD-10-CM | POA: Diagnosis not present

## 2020-06-17 DIAGNOSIS — Z7901 Long term (current) use of anticoagulants: Secondary | ICD-10-CM | POA: Diagnosis not present

## 2020-06-17 DIAGNOSIS — R1310 Dysphagia, unspecified: Secondary | ICD-10-CM | POA: Diagnosis not present

## 2020-06-17 DIAGNOSIS — I739 Peripheral vascular disease, unspecified: Secondary | ICD-10-CM | POA: Diagnosis not present

## 2020-06-19 DIAGNOSIS — D649 Anemia, unspecified: Secondary | ICD-10-CM | POA: Diagnosis not present

## 2020-06-19 DIAGNOSIS — R262 Difficulty in walking, not elsewhere classified: Secondary | ICD-10-CM | POA: Diagnosis not present

## 2020-06-19 DIAGNOSIS — I495 Sick sinus syndrome: Secondary | ICD-10-CM | POA: Diagnosis not present

## 2020-06-19 DIAGNOSIS — I4891 Unspecified atrial fibrillation: Secondary | ICD-10-CM | POA: Diagnosis not present

## 2020-06-19 DIAGNOSIS — L853 Xerosis cutis: Secondary | ICD-10-CM | POA: Diagnosis not present

## 2020-06-20 DIAGNOSIS — I4891 Unspecified atrial fibrillation: Secondary | ICD-10-CM | POA: Diagnosis not present

## 2020-06-20 DIAGNOSIS — I5022 Chronic systolic (congestive) heart failure: Secondary | ICD-10-CM | POA: Diagnosis not present

## 2020-06-20 DIAGNOSIS — I110 Hypertensive heart disease with heart failure: Secondary | ICD-10-CM | POA: Diagnosis not present

## 2020-06-20 DIAGNOSIS — R2681 Unsteadiness on feet: Secondary | ICD-10-CM | POA: Diagnosis not present

## 2020-06-20 DIAGNOSIS — Z7901 Long term (current) use of anticoagulants: Secondary | ICD-10-CM | POA: Diagnosis not present

## 2020-06-20 DIAGNOSIS — M6281 Muscle weakness (generalized): Secondary | ICD-10-CM | POA: Diagnosis not present

## 2020-06-20 DIAGNOSIS — E871 Hypo-osmolality and hyponatremia: Secondary | ICD-10-CM | POA: Diagnosis not present

## 2020-06-20 DIAGNOSIS — I739 Peripheral vascular disease, unspecified: Secondary | ICD-10-CM | POA: Diagnosis not present

## 2020-06-20 DIAGNOSIS — R1310 Dysphagia, unspecified: Secondary | ICD-10-CM | POA: Diagnosis not present

## 2020-06-23 DIAGNOSIS — I4891 Unspecified atrial fibrillation: Secondary | ICD-10-CM | POA: Diagnosis not present

## 2020-06-23 DIAGNOSIS — I5022 Chronic systolic (congestive) heart failure: Secondary | ICD-10-CM | POA: Diagnosis not present

## 2020-06-23 DIAGNOSIS — E119 Type 2 diabetes mellitus without complications: Secondary | ICD-10-CM | POA: Diagnosis not present

## 2020-06-23 DIAGNOSIS — E038 Other specified hypothyroidism: Secondary | ICD-10-CM | POA: Diagnosis not present

## 2020-06-23 DIAGNOSIS — D518 Other vitamin B12 deficiency anemias: Secondary | ICD-10-CM | POA: Diagnosis not present

## 2020-06-23 DIAGNOSIS — E559 Vitamin D deficiency, unspecified: Secondary | ICD-10-CM | POA: Diagnosis not present

## 2020-06-23 DIAGNOSIS — D649 Anemia, unspecified: Secondary | ICD-10-CM | POA: Diagnosis not present

## 2020-06-26 DIAGNOSIS — E119 Type 2 diabetes mellitus without complications: Secondary | ICD-10-CM | POA: Diagnosis not present

## 2020-06-26 DIAGNOSIS — D518 Other vitamin B12 deficiency anemias: Secondary | ICD-10-CM | POA: Diagnosis not present

## 2020-06-26 DIAGNOSIS — E038 Other specified hypothyroidism: Secondary | ICD-10-CM | POA: Diagnosis not present

## 2020-06-26 DIAGNOSIS — D649 Anemia, unspecified: Secondary | ICD-10-CM | POA: Diagnosis not present

## 2020-06-26 DIAGNOSIS — E559 Vitamin D deficiency, unspecified: Secondary | ICD-10-CM | POA: Diagnosis not present

## 2020-06-26 DIAGNOSIS — I5022 Chronic systolic (congestive) heart failure: Secondary | ICD-10-CM | POA: Diagnosis not present

## 2020-06-26 DIAGNOSIS — I4891 Unspecified atrial fibrillation: Secondary | ICD-10-CM | POA: Diagnosis not present

## 2020-06-27 DIAGNOSIS — I4891 Unspecified atrial fibrillation: Secondary | ICD-10-CM | POA: Diagnosis not present

## 2020-06-27 DIAGNOSIS — I5022 Chronic systolic (congestive) heart failure: Secondary | ICD-10-CM | POA: Diagnosis not present

## 2020-06-30 DIAGNOSIS — U071 COVID-19: Secondary | ICD-10-CM | POA: Diagnosis not present

## 2020-07-01 DIAGNOSIS — I110 Hypertensive heart disease with heart failure: Secondary | ICD-10-CM | POA: Diagnosis not present

## 2020-07-01 DIAGNOSIS — R41841 Cognitive communication deficit: Secondary | ICD-10-CM

## 2020-07-01 DIAGNOSIS — M6281 Muscle weakness (generalized): Secondary | ICD-10-CM | POA: Diagnosis not present

## 2020-07-01 DIAGNOSIS — I4891 Unspecified atrial fibrillation: Secondary | ICD-10-CM | POA: Diagnosis not present

## 2020-07-01 DIAGNOSIS — I5022 Chronic systolic (congestive) heart failure: Secondary | ICD-10-CM | POA: Diagnosis not present

## 2020-07-01 DIAGNOSIS — I739 Peripheral vascular disease, unspecified: Secondary | ICD-10-CM | POA: Diagnosis not present

## 2020-07-01 DIAGNOSIS — Z7901 Long term (current) use of anticoagulants: Secondary | ICD-10-CM

## 2020-07-01 DIAGNOSIS — E871 Hypo-osmolality and hyponatremia: Secondary | ICD-10-CM | POA: Diagnosis not present

## 2020-07-01 DIAGNOSIS — R1310 Dysphagia, unspecified: Secondary | ICD-10-CM

## 2020-07-01 DIAGNOSIS — R2681 Unsteadiness on feet: Secondary | ICD-10-CM | POA: Diagnosis not present

## 2020-07-02 DIAGNOSIS — M6281 Muscle weakness (generalized): Secondary | ICD-10-CM | POA: Diagnosis not present

## 2020-07-02 DIAGNOSIS — Z7901 Long term (current) use of anticoagulants: Secondary | ICD-10-CM | POA: Diagnosis not present

## 2020-07-02 DIAGNOSIS — R1310 Dysphagia, unspecified: Secondary | ICD-10-CM | POA: Diagnosis not present

## 2020-07-02 DIAGNOSIS — I4891 Unspecified atrial fibrillation: Secondary | ICD-10-CM | POA: Diagnosis not present

## 2020-07-02 DIAGNOSIS — I739 Peripheral vascular disease, unspecified: Secondary | ICD-10-CM | POA: Diagnosis not present

## 2020-07-02 DIAGNOSIS — E871 Hypo-osmolality and hyponatremia: Secondary | ICD-10-CM | POA: Diagnosis not present

## 2020-07-02 DIAGNOSIS — I5022 Chronic systolic (congestive) heart failure: Secondary | ICD-10-CM | POA: Diagnosis not present

## 2020-07-02 DIAGNOSIS — R2681 Unsteadiness on feet: Secondary | ICD-10-CM | POA: Diagnosis not present

## 2020-07-02 DIAGNOSIS — I110 Hypertensive heart disease with heart failure: Secondary | ICD-10-CM | POA: Diagnosis not present

## 2020-07-03 DIAGNOSIS — I82401 Acute embolism and thrombosis of unspecified deep veins of right lower extremity: Secondary | ICD-10-CM | POA: Diagnosis not present

## 2020-07-03 DIAGNOSIS — R600 Localized edema: Secondary | ICD-10-CM | POA: Diagnosis not present

## 2020-07-03 DIAGNOSIS — E559 Vitamin D deficiency, unspecified: Secondary | ICD-10-CM | POA: Diagnosis not present

## 2020-07-03 DIAGNOSIS — I4891 Unspecified atrial fibrillation: Secondary | ICD-10-CM | POA: Diagnosis not present

## 2020-07-03 DIAGNOSIS — D649 Anemia, unspecified: Secondary | ICD-10-CM | POA: Diagnosis not present

## 2020-07-03 DIAGNOSIS — K5900 Constipation, unspecified: Secondary | ICD-10-CM | POA: Diagnosis not present

## 2020-07-03 DIAGNOSIS — I495 Sick sinus syndrome: Secondary | ICD-10-CM | POA: Diagnosis not present

## 2020-07-03 DIAGNOSIS — L853 Xerosis cutis: Secondary | ICD-10-CM | POA: Diagnosis not present

## 2020-07-03 DIAGNOSIS — R1310 Dysphagia, unspecified: Secondary | ICD-10-CM | POA: Diagnosis not present

## 2020-07-03 DIAGNOSIS — I5022 Chronic systolic (congestive) heart failure: Secondary | ICD-10-CM | POA: Diagnosis not present

## 2020-07-03 DIAGNOSIS — E871 Hypo-osmolality and hyponatremia: Secondary | ICD-10-CM | POA: Diagnosis not present

## 2020-07-06 DIAGNOSIS — R0600 Dyspnea, unspecified: Secondary | ICD-10-CM | POA: Diagnosis not present

## 2020-07-06 DIAGNOSIS — R0902 Hypoxemia: Secondary | ICD-10-CM | POA: Diagnosis not present

## 2020-07-07 DIAGNOSIS — R1310 Dysphagia, unspecified: Secondary | ICD-10-CM | POA: Diagnosis not present

## 2020-07-07 DIAGNOSIS — Z7901 Long term (current) use of anticoagulants: Secondary | ICD-10-CM | POA: Diagnosis not present

## 2020-07-07 DIAGNOSIS — E871 Hypo-osmolality and hyponatremia: Secondary | ICD-10-CM | POA: Diagnosis not present

## 2020-07-07 DIAGNOSIS — I739 Peripheral vascular disease, unspecified: Secondary | ICD-10-CM | POA: Diagnosis not present

## 2020-07-07 DIAGNOSIS — I4891 Unspecified atrial fibrillation: Secondary | ICD-10-CM | POA: Diagnosis not present

## 2020-07-07 DIAGNOSIS — M6281 Muscle weakness (generalized): Secondary | ICD-10-CM | POA: Diagnosis not present

## 2020-07-07 DIAGNOSIS — I5022 Chronic systolic (congestive) heart failure: Secondary | ICD-10-CM | POA: Diagnosis not present

## 2020-07-07 DIAGNOSIS — R2681 Unsteadiness on feet: Secondary | ICD-10-CM | POA: Diagnosis not present

## 2020-07-07 DIAGNOSIS — I110 Hypertensive heart disease with heart failure: Secondary | ICD-10-CM | POA: Diagnosis not present

## 2020-07-08 DIAGNOSIS — I495 Sick sinus syndrome: Secondary | ICD-10-CM | POA: Diagnosis not present

## 2020-07-10 DIAGNOSIS — I5022 Chronic systolic (congestive) heart failure: Secondary | ICD-10-CM | POA: Diagnosis not present

## 2020-07-10 DIAGNOSIS — E871 Hypo-osmolality and hyponatremia: Secondary | ICD-10-CM | POA: Diagnosis not present

## 2020-07-10 DIAGNOSIS — I110 Hypertensive heart disease with heart failure: Secondary | ICD-10-CM | POA: Diagnosis not present

## 2020-07-10 DIAGNOSIS — E7849 Other hyperlipidemia: Secondary | ICD-10-CM | POA: Diagnosis not present

## 2020-07-10 DIAGNOSIS — R0989 Other specified symptoms and signs involving the circulatory and respiratory systems: Secondary | ICD-10-CM | POA: Diagnosis not present

## 2020-07-10 DIAGNOSIS — K5900 Constipation, unspecified: Secondary | ICD-10-CM | POA: Diagnosis not present

## 2020-07-10 DIAGNOSIS — Z79899 Other long term (current) drug therapy: Secondary | ICD-10-CM | POA: Diagnosis not present

## 2020-07-10 DIAGNOSIS — E559 Vitamin D deficiency, unspecified: Secondary | ICD-10-CM | POA: Diagnosis not present

## 2020-07-10 DIAGNOSIS — J90 Pleural effusion, not elsewhere classified: Secondary | ICD-10-CM | POA: Diagnosis not present

## 2020-07-10 DIAGNOSIS — Z7901 Long term (current) use of anticoagulants: Secondary | ICD-10-CM | POA: Diagnosis not present

## 2020-07-10 DIAGNOSIS — I739 Peripheral vascular disease, unspecified: Secondary | ICD-10-CM | POA: Diagnosis not present

## 2020-07-10 DIAGNOSIS — M6281 Muscle weakness (generalized): Secondary | ICD-10-CM | POA: Diagnosis not present

## 2020-07-10 DIAGNOSIS — R1310 Dysphagia, unspecified: Secondary | ICD-10-CM | POA: Diagnosis not present

## 2020-07-10 DIAGNOSIS — R2681 Unsteadiness on feet: Secondary | ICD-10-CM | POA: Diagnosis not present

## 2020-07-10 DIAGNOSIS — I4891 Unspecified atrial fibrillation: Secondary | ICD-10-CM | POA: Diagnosis not present

## 2020-07-10 DIAGNOSIS — D518 Other vitamin B12 deficiency anemias: Secondary | ICD-10-CM | POA: Diagnosis not present

## 2020-07-10 DIAGNOSIS — E119 Type 2 diabetes mellitus without complications: Secondary | ICD-10-CM | POA: Diagnosis not present

## 2020-07-11 ENCOUNTER — Encounter: Payer: Self-pay | Admitting: Nurse Practitioner

## 2020-07-11 ENCOUNTER — Other Ambulatory Visit: Payer: Self-pay

## 2020-07-11 ENCOUNTER — Non-Acute Institutional Stay: Payer: Medicare Other | Admitting: Nurse Practitioner

## 2020-07-11 ENCOUNTER — Ambulatory Visit
Admission: RE | Admit: 2020-07-11 | Discharge: 2020-07-11 | Disposition: A | Discharge status: Home / Self Care | Payer: Medicare Other | Source: Ambulatory Visit | Attending: Family Medicine | Admitting: Family Medicine

## 2020-07-11 DIAGNOSIS — I5022 Chronic systolic (congestive) heart failure: Secondary | ICD-10-CM | POA: Diagnosis not present

## 2020-07-11 DIAGNOSIS — R1312 Dysphagia, oropharyngeal phase: Secondary | ICD-10-CM | POA: Diagnosis not present

## 2020-07-11 DIAGNOSIS — Z515 Encounter for palliative care: Secondary | ICD-10-CM

## 2020-07-11 DIAGNOSIS — T17320A Food in larynx causing asphyxiation, initial encounter: Secondary | ICD-10-CM | POA: Diagnosis not present

## 2020-07-11 DIAGNOSIS — R6339 Other feeding difficulties: Secondary | ICD-10-CM | POA: Diagnosis not present

## 2020-07-11 DIAGNOSIS — R1310 Dysphagia, unspecified: Secondary | ICD-10-CM | POA: Diagnosis not present

## 2020-07-11 NOTE — Progress Notes (Signed)
[facility] Consult Note Telephone: [phone]  Fax: [phone]  PATIENT NAME: Douglas Calhoun DOB: [birth year] MRN: [id number]  PRIMARY CARE PROVIDER:   Kandis [facility]an AL[facility] RESPONSIBLE PARTY:Grandson Erroll Luna [phone]  1.Advance Care Planning;DNR;DNR and most form completed. MOST formsays DNR, limited additional interventions, antibiotics, IV fluids, no feeding tube; Hospice referral  2. Goals of Care: Goals include to maximize quality of life and symptom management. Our advance care planning conversation included a discussion about:   The value and importance of advance care planning  Exploration of personal, cultural or spiritual beliefs that might influence medical decisions  Exploration of goals of care in the event of a sudden injury or illness  Identification and preparation of a healthcare agent  Review and updating or creation of anadvance directive document.  3.Palliative care encounter; Palliative care encounter; Palliative medicine team will continue to support patient, patient's family, and medical team. Visit consisted of counseling and education dealing with the complex and emotionally intense issues of symptom management and palliative care in the setting of serious and potentially life-threatening illness  I spent 60 minutes providing this consultation, starting at 11:00am. More than 50% of the time in this consultation was spent coordinating communication.   HISTORY OF PRESENT ILLNESS:  Douglas Calhoun is a 87 y.o. year old male with multiple medical problems including Prostate cancer s/p 15 radiation treatments, sick sinus syndrome s/p pacemaker, atrial fibrillation, systolic congestive heart failure, cardiomyopathy, compression fractures of lumbar spine, varicose veins, cataracts, history of herpes zoster, hearing loss, breast surgery, appendectomy. In person f/u visit with Douglas Calhoun for ongoing  discussions of complex medical decision making, chronic disease management, monitoring progression, symptoms. I visited Douglas Calhoun at [place] where he currently resides. Douglas Calhoun ambulates with a walker. Douglas Calhoun does ambulate around the building for exercise daily. No recent falls. Douglas Calhoun does require assistance with ADL's supervision. Douglas Calhoun does feed himself and will be going for swallowing study today as he continues to have recurrent PNA with last episode last week. Appetite has been fair. Douglas Calhoun does become dyspneic with exertional activities. Douglas Calhoun does improve with rest. Douglas Calhoun does wear O2 intermit, mostly at night. Douglas Calhoun is a DNR. At present, Douglas Calhoun is sitting in the chair in his room. I visited and observed Douglas Calhoun. We talked about purpose of Palliative care visit. Douglas Calhoun in agreement. We talked about how he has been feeling. Mr Calhoun endorses he has been doing fairly well. He was waiting for his daughter-in-law to come pick him up to bring him to his swallowing test today. We talked about the test. We talked about recent PNA. We talked about symptoms of pain which he denies. We talked about dyspnea, supplemental O2. We talked about using O2 at night, more intermit. We talked about appetite. We talked about adjusting to ALF. [name] endorses he had a difficult time at first but has now adjusted. [name] endorses he likes the facility. He goes and visits the donkeys. We talked about medical goals. Discussed with [name] will contact [name] grandson for update, Mr. Schreier in agreement. We talked about his activity level, walking with walker. We talked about role pc in poc. I called [name], Douglas Calhoun grandson. We talked about PC visit, update and pending swallowing evaluation. We talked about medical goals, Douglas Calhoun has adjusted and seems content at ALF. We talked about f/u PC visit  in 2 months if needed or sooner if declines.   I received a call from [place]  completed swallowing study. It was recommended NPO, as he is unable to manage his airway, aspirating. I called [name] back, facility had already contacted him as well as Happi Overton to discuss results. Family discussed and wishes are for comfort care, comfort feedings per Douglas Calhoun request as he is cognitive able to make his own decisions. Medical goals reviewed. We talked about option of Hospice services under Medicare benefit. We talked about chronic disease progression CHF, EF 25%, with age, now unable to manage his airway with aspirating, recent PNA. [name] in agreement with Hospice services. Reviewed with Hospice Physicians for eligibility, found eligible. I called Douglas Calhoun, Scientist, physiological at [facility] with wishes for Hospice. Douglas Calhoun endorses she will contact primary and send order for Hospice.   Palliative Care was asked to help to continue to address goals of care.   CODE STATUS: DNR  PPS: 50% HOSPICE ELIGIBILITY/DIAGNOSIS: TBD  PAST MEDICAL HISTORY:  Past Medical History:  Diagnosis Date  . Achilles tendinitis   . Cardiomyopathy ([place])    a. 01/2019 Echo: EF 25%, glob HK.  . Cataract   . Chronic HFrEF (heart failure with reduced ejection fraction) ([place])    a. 03/2018 Echo: EF 45-50%); b. 01/2019 Echo: EF 25%. Glob HK. Triv AI/PR, Mod TR. RVSP 57.34mmHg.  [facility] Kitchen Hearing loss   . Herpes zoster without complication 6/94/8546  . Permanent atrial fibrillation (HCC)    a. CHA2DS2VASc = 4-->Eliquis.  . Personal history of prostate cancer 10/08/2014   s/p 15 radiation treatments treated by Dr. Madelin Headings   . Prostate cancer ([place]) 03/14/2020  . SSS (sick sinus syndrome) (HCC)    a. s/p MDT Micra AV leadless PPM (ser # [id number]).  . Varicose vein of leg     SOCIAL HX:  Social History   Tobacco Use  . Smoking status: Never Smoker  . Smokeless tobacco: Never Used  Substance Use Topics  . Alcohol use: Not Currently    Alcohol/week: 0.0 standard drinks    ALLERGIES: No Known Allergies      PHYSICAL EXAM:   General: pleasant male Cardiovascular: regular rate and rhythm Pulmonary:decrease bases Extremities: mild edema, no joint deformities Neurological: walks with walker  Christin Ihor Gully, NP

## 2020-07-11 NOTE — Progress Notes (Signed)
Modified Barium Swallow Progress Note  Patient Details  Name: Douglas Calhoun MRN: [id number] Date of Birth: 1919-08-13  Today's Date: 07/11/2020  Modified Barium Swallow completed.  Full report located under Chart Review in the Imaging Section.  Brief recommendations include the following:  Clinical Impression  Pt presents with severe sensori-motor pharyngeal phase dysphagia that results in penetration and aspiration of thin liquids, nectar thick liquids and puree. Pt has anterior curvature of his cervical spine which impedes epiglottic deflection. This accompanied by severely diminished hyolaryngeal movement results in penetration and aspiration before and during the swallow. While pt did display mild throat clears throughout the study, they weren't always related to moments of aspiration. Therefore aspiration was largely silent throughout the study. Compensatory strategies were attempted but were not able to prevent aspiration of boluses. Based on this study and consult with Radiologist, I am not able to make a safe diet recommendation. With pt's permission, I conveyed the results of this study with his emergency contact who was with him, I also spoke with pt's POA, pt's facility and his Palliative Care nurse Lafe Garin). All questions were answered to satisfaction.   Swallow Evaluation Recommendations       SLP Diet Recommendations: NPO         Oral Care Recommendations: Oral care QID      Josedejesus Marcum B. Rutherford Nail M.S., CCC-SLP, [place] Office [phone]   Rashawn Rayman Rutherford Nail 07/11/2020,2:41 PM

## 2020-07-13 DIAGNOSIS — I5022 Chronic systolic (congestive) heart failure: Secondary | ICD-10-CM | POA: Diagnosis not present

## 2020-07-13 DIAGNOSIS — R0902 Hypoxemia: Secondary | ICD-10-CM | POA: Diagnosis not present

## 2020-07-14 DIAGNOSIS — I739 Peripheral vascular disease, unspecified: Secondary | ICD-10-CM | POA: Diagnosis not present

## 2020-07-14 DIAGNOSIS — I4891 Unspecified atrial fibrillation: Secondary | ICD-10-CM | POA: Diagnosis not present

## 2020-07-14 DIAGNOSIS — R2681 Unsteadiness on feet: Secondary | ICD-10-CM | POA: Diagnosis not present

## 2020-07-14 DIAGNOSIS — E871 Hypo-osmolality and hyponatremia: Secondary | ICD-10-CM | POA: Diagnosis not present

## 2020-07-14 DIAGNOSIS — I5022 Chronic systolic (congestive) heart failure: Secondary | ICD-10-CM | POA: Diagnosis not present

## 2020-07-14 DIAGNOSIS — I110 Hypertensive heart disease with heart failure: Secondary | ICD-10-CM | POA: Diagnosis not present

## 2020-07-14 DIAGNOSIS — M6281 Muscle weakness (generalized): Secondary | ICD-10-CM | POA: Diagnosis not present

## 2020-07-14 DIAGNOSIS — R1310 Dysphagia, unspecified: Secondary | ICD-10-CM | POA: Diagnosis not present

## 2020-07-14 DIAGNOSIS — Z7901 Long term (current) use of anticoagulants: Secondary | ICD-10-CM | POA: Diagnosis not present

## 2020-07-15 DIAGNOSIS — E119 Type 2 diabetes mellitus without complications: Secondary | ICD-10-CM | POA: Diagnosis not present

## 2020-07-15 DIAGNOSIS — E7849 Other hyperlipidemia: Secondary | ICD-10-CM | POA: Diagnosis not present

## 2020-07-15 DIAGNOSIS — Z79899 Other long term (current) drug therapy: Secondary | ICD-10-CM | POA: Diagnosis not present

## 2020-07-15 DIAGNOSIS — D518 Other vitamin B12 deficiency anemias: Secondary | ICD-10-CM | POA: Diagnosis not present

## 2020-07-17 DIAGNOSIS — Z7901 Long term (current) use of anticoagulants: Secondary | ICD-10-CM | POA: Diagnosis not present

## 2020-07-17 DIAGNOSIS — E871 Hypo-osmolality and hyponatremia: Secondary | ICD-10-CM | POA: Diagnosis not present

## 2020-07-17 DIAGNOSIS — I4891 Unspecified atrial fibrillation: Secondary | ICD-10-CM | POA: Diagnosis not present

## 2020-07-17 DIAGNOSIS — I739 Peripheral vascular disease, unspecified: Secondary | ICD-10-CM | POA: Diagnosis not present

## 2020-07-17 DIAGNOSIS — E559 Vitamin D deficiency, unspecified: Secondary | ICD-10-CM | POA: Diagnosis not present

## 2020-07-17 DIAGNOSIS — R600 Localized edema: Secondary | ICD-10-CM | POA: Diagnosis not present

## 2020-07-17 DIAGNOSIS — M6281 Muscle weakness (generalized): Secondary | ICD-10-CM | POA: Diagnosis not present

## 2020-07-17 DIAGNOSIS — R2681 Unsteadiness on feet: Secondary | ICD-10-CM | POA: Diagnosis not present

## 2020-07-17 DIAGNOSIS — I5022 Chronic systolic (congestive) heart failure: Secondary | ICD-10-CM | POA: Diagnosis not present

## 2020-07-17 DIAGNOSIS — I82401 Acute embolism and thrombosis of unspecified deep veins of right lower extremity: Secondary | ICD-10-CM | POA: Diagnosis not present

## 2020-07-17 DIAGNOSIS — I110 Hypertensive heart disease with heart failure: Secondary | ICD-10-CM | POA: Diagnosis not present

## 2020-07-17 DIAGNOSIS — R1310 Dysphagia, unspecified: Secondary | ICD-10-CM | POA: Diagnosis not present

## 2020-07-17 DIAGNOSIS — K5900 Constipation, unspecified: Secondary | ICD-10-CM | POA: Diagnosis not present

## 2020-07-25 DIAGNOSIS — D649 Anemia, unspecified: Secondary | ICD-10-CM | POA: Diagnosis not present

## 2020-07-25 DIAGNOSIS — E559 Vitamin D deficiency, unspecified: Secondary | ICD-10-CM | POA: Diagnosis not present

## 2020-07-25 DIAGNOSIS — D518 Other vitamin B12 deficiency anemias: Secondary | ICD-10-CM | POA: Diagnosis not present

## 2020-07-25 DIAGNOSIS — I4891 Unspecified atrial fibrillation: Secondary | ICD-10-CM | POA: Diagnosis not present

## 2020-07-25 DIAGNOSIS — E038 Other specified hypothyroidism: Secondary | ICD-10-CM | POA: Diagnosis not present

## 2020-07-25 DIAGNOSIS — I5022 Chronic systolic (congestive) heart failure: Secondary | ICD-10-CM | POA: Diagnosis not present

## 2020-07-25 DIAGNOSIS — E119 Type 2 diabetes mellitus without complications: Secondary | ICD-10-CM | POA: Diagnosis not present

## 2020-08-06 DIAGNOSIS — M79675 Pain in left toe(s): Secondary | ICD-10-CM | POA: Diagnosis not present

## 2020-08-06 DIAGNOSIS — B351 Tinea unguium: Secondary | ICD-10-CM | POA: Diagnosis not present

## 2020-08-06 DIAGNOSIS — M79674 Pain in right toe(s): Secondary | ICD-10-CM | POA: Diagnosis not present

## 2020-08-06 DIAGNOSIS — I739 Peripheral vascular disease, unspecified: Secondary | ICD-10-CM | POA: Diagnosis not present

## 2020-08-14 DIAGNOSIS — E559 Vitamin D deficiency, unspecified: Secondary | ICD-10-CM | POA: Diagnosis not present

## 2020-08-14 DIAGNOSIS — K5900 Constipation, unspecified: Secondary | ICD-10-CM | POA: Diagnosis not present

## 2020-08-14 DIAGNOSIS — R600 Localized edema: Secondary | ICD-10-CM | POA: Diagnosis not present

## 2020-08-14 DIAGNOSIS — I4891 Unspecified atrial fibrillation: Secondary | ICD-10-CM | POA: Diagnosis not present

## 2020-08-14 DIAGNOSIS — R1310 Dysphagia, unspecified: Secondary | ICD-10-CM | POA: Diagnosis not present

## 2020-08-14 DIAGNOSIS — I5022 Chronic systolic (congestive) heart failure: Secondary | ICD-10-CM | POA: Diagnosis not present

## 2020-08-14 DIAGNOSIS — D649 Anemia, unspecified: Secondary | ICD-10-CM | POA: Diagnosis not present

## 2020-08-14 DIAGNOSIS — I495 Sick sinus syndrome: Secondary | ICD-10-CM | POA: Diagnosis not present

## 2020-08-14 DIAGNOSIS — I82401 Acute embolism and thrombosis of unspecified deep veins of right lower extremity: Secondary | ICD-10-CM | POA: Diagnosis not present

## 2020-08-28 DIAGNOSIS — D649 Anemia, unspecified: Secondary | ICD-10-CM | POA: Diagnosis not present

## 2020-08-28 DIAGNOSIS — I5022 Chronic systolic (congestive) heart failure: Secondary | ICD-10-CM | POA: Diagnosis not present

## 2020-08-28 DIAGNOSIS — E038 Other specified hypothyroidism: Secondary | ICD-10-CM | POA: Diagnosis not present

## 2020-08-28 DIAGNOSIS — D518 Other vitamin B12 deficiency anemias: Secondary | ICD-10-CM | POA: Diagnosis not present

## 2020-08-28 DIAGNOSIS — I4891 Unspecified atrial fibrillation: Secondary | ICD-10-CM | POA: Diagnosis not present

## 2020-08-28 DIAGNOSIS — E559 Vitamin D deficiency, unspecified: Secondary | ICD-10-CM | POA: Diagnosis not present

## 2020-08-28 DIAGNOSIS — E119 Type 2 diabetes mellitus without complications: Secondary | ICD-10-CM | POA: Diagnosis not present

## 2020-09-25 DIAGNOSIS — R1310 Dysphagia, unspecified: Secondary | ICD-10-CM | POA: Diagnosis not present

## 2020-09-25 DIAGNOSIS — G629 Polyneuropathy, unspecified: Secondary | ICD-10-CM | POA: Diagnosis not present

## 2020-09-25 DIAGNOSIS — I4891 Unspecified atrial fibrillation: Secondary | ICD-10-CM | POA: Diagnosis not present

## 2020-09-25 DIAGNOSIS — K5900 Constipation, unspecified: Secondary | ICD-10-CM | POA: Diagnosis not present

## 2020-09-25 DIAGNOSIS — I5022 Chronic systolic (congestive) heart failure: Secondary | ICD-10-CM | POA: Diagnosis not present

## 2020-09-25 DIAGNOSIS — E871 Hypo-osmolality and hyponatremia: Secondary | ICD-10-CM | POA: Diagnosis not present

## 2020-09-25 DIAGNOSIS — L853 Xerosis cutis: Secondary | ICD-10-CM | POA: Diagnosis not present

## 2020-09-25 DIAGNOSIS — D649 Anemia, unspecified: Secondary | ICD-10-CM | POA: Diagnosis not present

## 2020-12-09 DIAGNOSIS — I495 Sick sinus syndrome: Secondary | ICD-10-CM | POA: Diagnosis not present

## 2020-12-20 ENCOUNTER — Telehealth: Payer: Self-pay

## 2020-12-20 NOTE — Telephone Encounter (Signed)
No working phone numbers. Attempted to call for AWV over phone. I did leave vm with grandson who is listed in chart and gave number so he can call me to schedule this. [phone]

## 2021-01-27 DIAGNOSIS — R600 Localized edema: Secondary | ICD-10-CM | POA: Diagnosis not present

## 2021-01-27 DIAGNOSIS — I7091 Generalized atherosclerosis: Secondary | ICD-10-CM | POA: Diagnosis not present

## 2021-01-27 DIAGNOSIS — B351 Tinea unguium: Secondary | ICD-10-CM | POA: Diagnosis not present

## 2021-05-05 DIAGNOSIS — B351 Tinea unguium: Secondary | ICD-10-CM | POA: Diagnosis not present

## 2021-05-05 DIAGNOSIS — I7091 Generalized atherosclerosis: Secondary | ICD-10-CM | POA: Diagnosis not present

## 2021-05-18 DIAGNOSIS — I495 Sick sinus syndrome: Secondary | ICD-10-CM | POA: Diagnosis not present

## 2021-08-26 DIAGNOSIS — J811 Chronic pulmonary edema: Secondary | ICD-10-CM | POA: Diagnosis not present

## 2021-09-10 DIAGNOSIS — R0602 Shortness of breath: Secondary | ICD-10-CM | POA: Diagnosis not present

## 2021-10-08 DIAGNOSIS — K5900 Constipation, unspecified: Secondary | ICD-10-CM | POA: Diagnosis not present

## 2021-10-08 DIAGNOSIS — E559 Vitamin D deficiency, unspecified: Secondary | ICD-10-CM | POA: Diagnosis not present

## 2021-10-08 DIAGNOSIS — I5022 Chronic systolic (congestive) heart failure: Secondary | ICD-10-CM | POA: Diagnosis not present

## 2021-10-08 DIAGNOSIS — I82401 Acute embolism and thrombosis of unspecified deep veins of right lower extremity: Secondary | ICD-10-CM | POA: Diagnosis not present

## 2021-10-08 DIAGNOSIS — L853 Xerosis cutis: Secondary | ICD-10-CM | POA: Diagnosis not present

## 2021-10-08 DIAGNOSIS — I495 Sick sinus syndrome: Secondary | ICD-10-CM | POA: Diagnosis not present

## 2021-10-08 DIAGNOSIS — R1310 Dysphagia, unspecified: Secondary | ICD-10-CM | POA: Diagnosis not present

## 2021-10-08 DIAGNOSIS — I4891 Unspecified atrial fibrillation: Secondary | ICD-10-CM | POA: Diagnosis not present

## 2021-10-08 DIAGNOSIS — R600 Localized edema: Secondary | ICD-10-CM | POA: Diagnosis not present

## 2021-10-08 DIAGNOSIS — D649 Anemia, unspecified: Secondary | ICD-10-CM | POA: Diagnosis not present

## 2021-10-08 DIAGNOSIS — E871 Hypo-osmolality and hyponatremia: Secondary | ICD-10-CM | POA: Diagnosis not present

## 2021-10-13 DIAGNOSIS — I495 Sick sinus syndrome: Secondary | ICD-10-CM | POA: Diagnosis not present

## 2022-01-28 DIAGNOSIS — R0602 Shortness of breath: Secondary | ICD-10-CM | POA: Diagnosis not present

## 2022-01-28 DIAGNOSIS — R059 Cough, unspecified: Secondary | ICD-10-CM | POA: Diagnosis not present

## 2022-02-26 DIAGNOSIS — R062 Wheezing: Secondary | ICD-10-CM | POA: Diagnosis not present

## 2022-03-17 DIAGNOSIS — R059 Cough, unspecified: Secondary | ICD-10-CM | POA: Diagnosis not present

## 2022-03-26 DEATH — deceased

## 2022-08-07 IMAGING — CT CT HEAD W/O CM
3 series · 15 of 47 positions shown, 18 images · non-contrast
Comparison: No priors.

CLINICAL DATA: [AGE] male with history of mental status
change.

EXAM:
CT HEAD WITHOUT CONTRAST
TECHNIQUE: Contiguous axial images were obtained from the base of the skull
through the vertex without intravenous contrast.

[Series 2: head wo · axial · 0.46mm/px · z∈[+169,+304]mm · 9 of 33 slices shown, 12 images]
[im 3/33  brain]
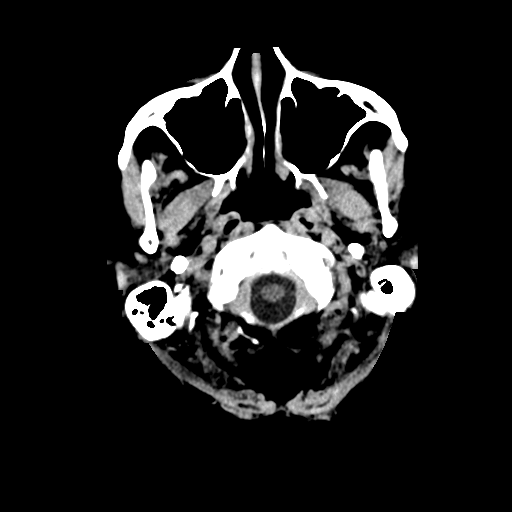
[im 3/33  bone]
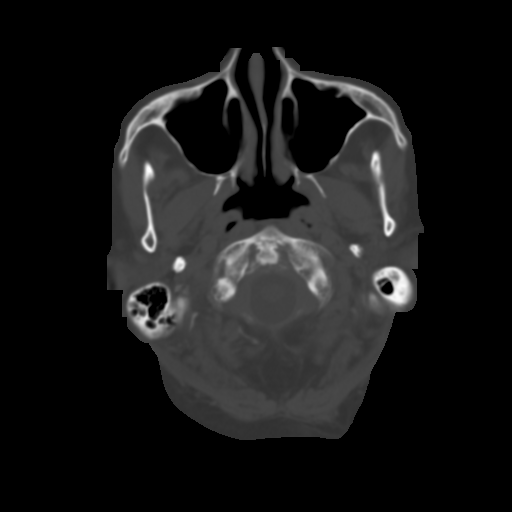
[im 6/33  brain]
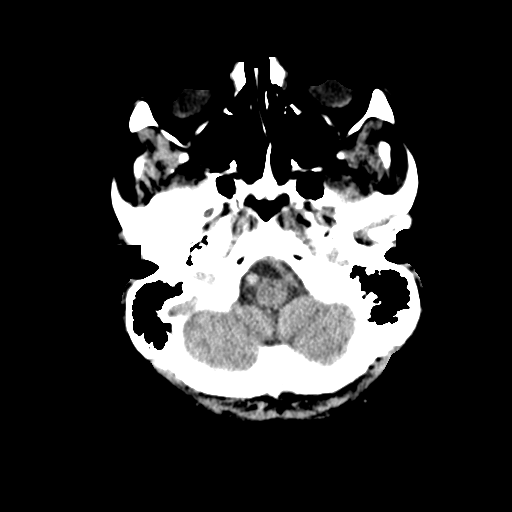
[im 9/33  brain]
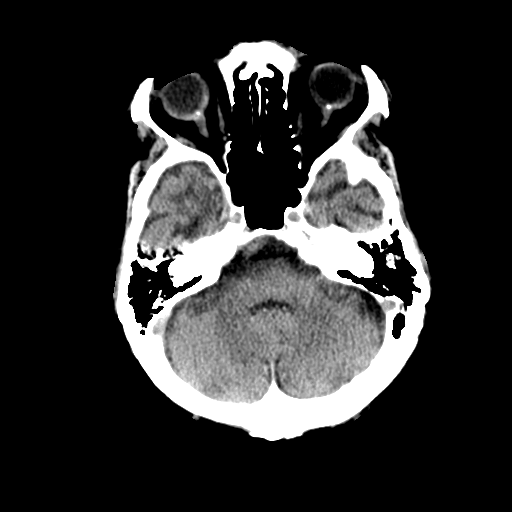
[im 13/33  brain]
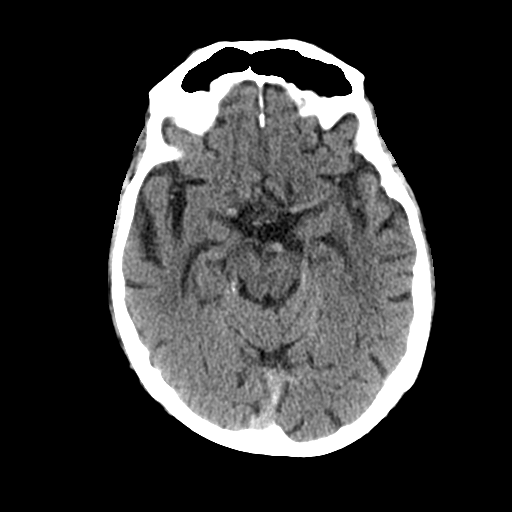
[im 17/33  brain]
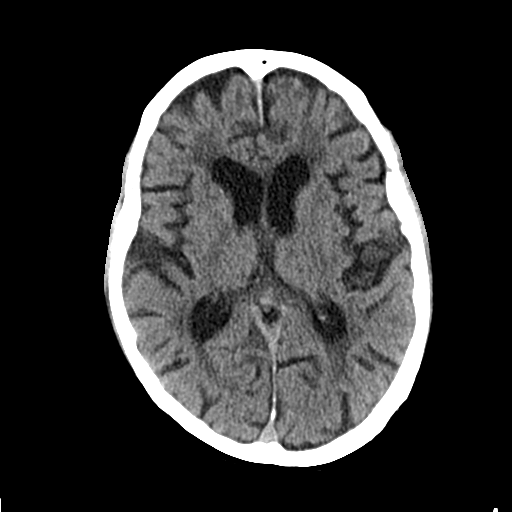
[im 17/33  bone]
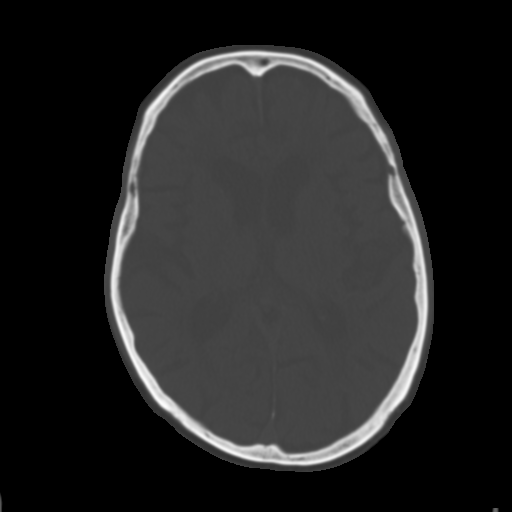
[im 20/33  brain]
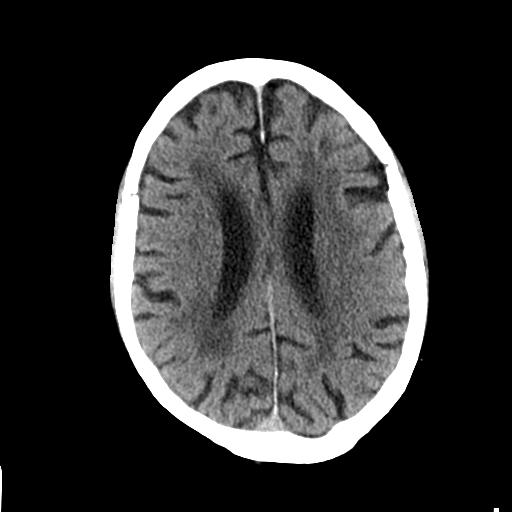
[im 24/33  brain]
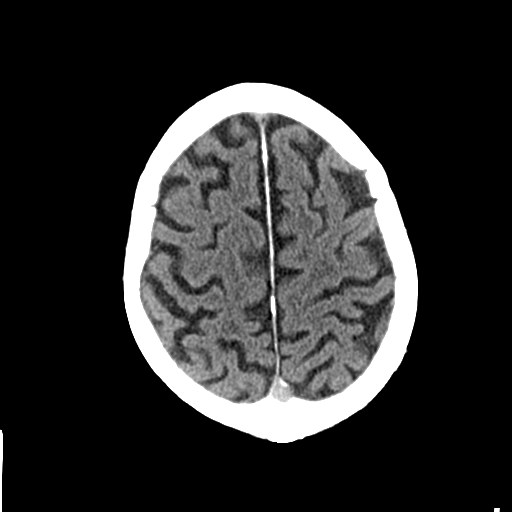
[im 27/33  brain]
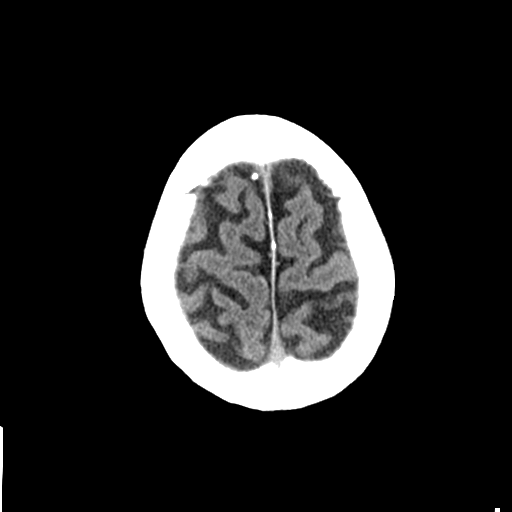
[im 30/33  brain]
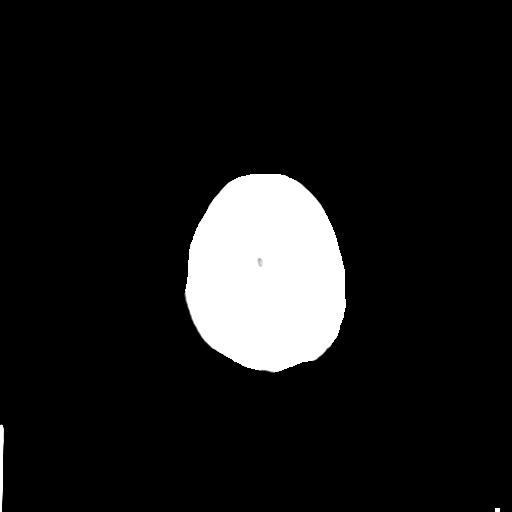
[im 30/33  bone]
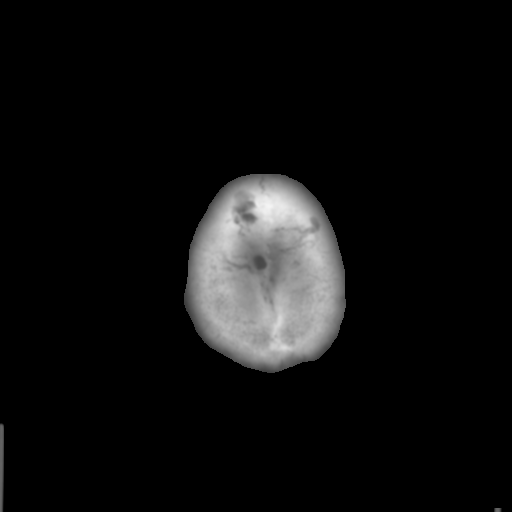

[Series 4: coronal soft tissue · coronal · 0.34mm/px · 3 of 74 slices shown]
[im 25/74  brain]
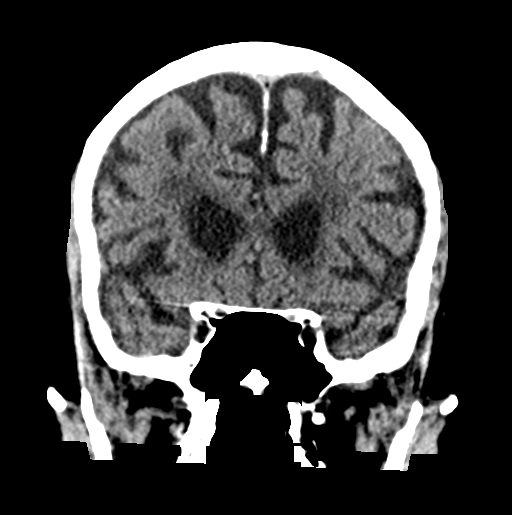
[im 33/74  brain]
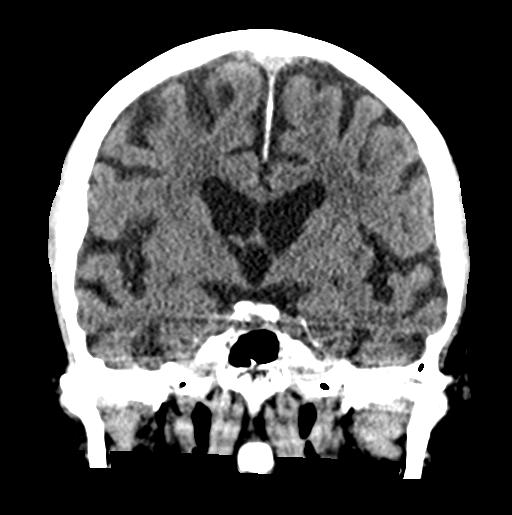
[im 41/74  brain]
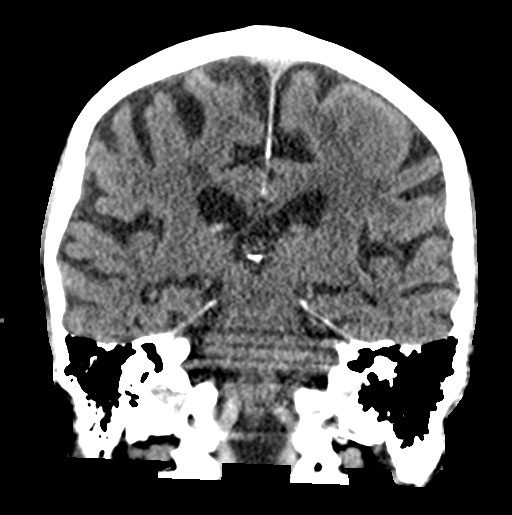

[Series 5: sagittal soft tissue · sagittal · 0.33mm/px · 3 of 59 slices shown]
[im 20/59  brain]
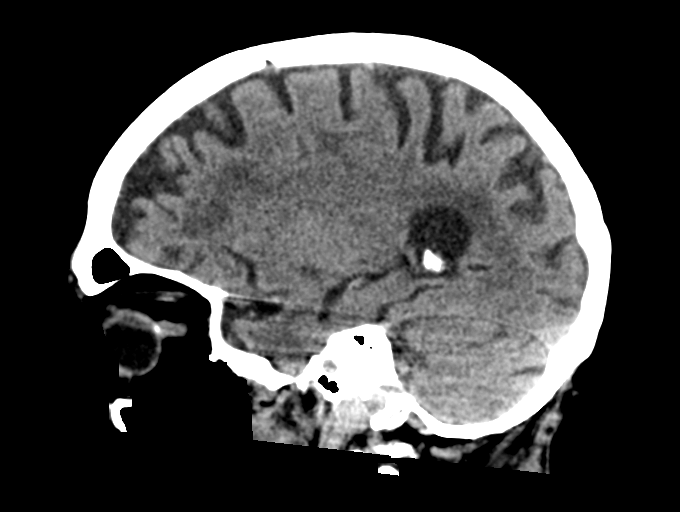
[im 30/59  brain]
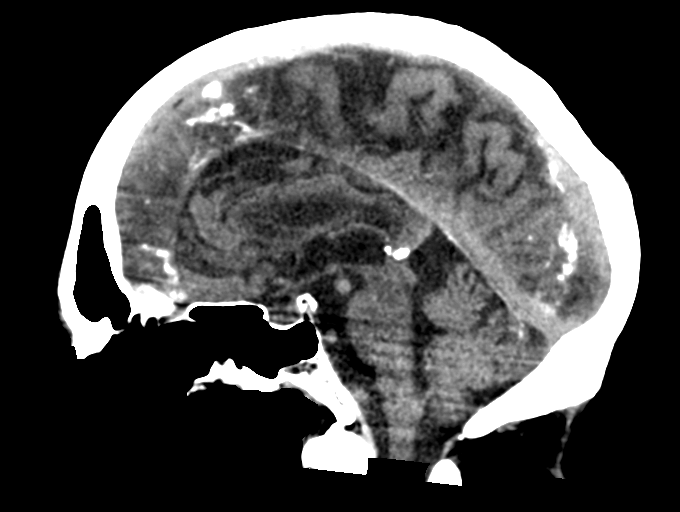
[im 39/59  brain]
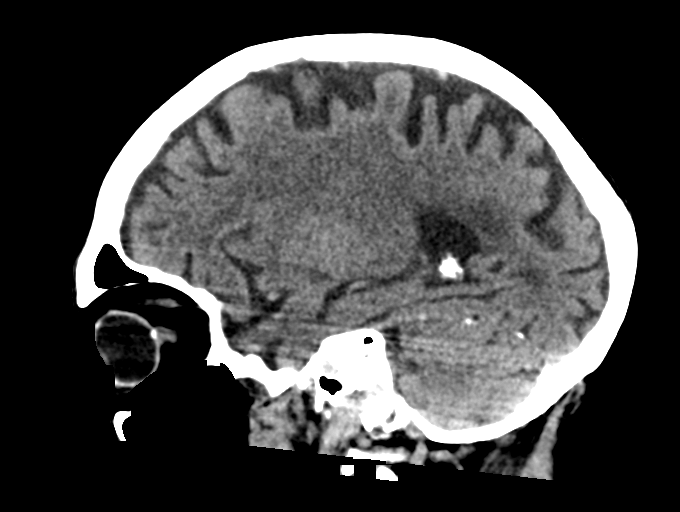

[15 of 47 positions shown; findings below may reference images not displayed]

FINDINGS: Brain: Mild to moderate cerebral and mild cerebellar atrophy. Patchy
and confluent areas of decreased attenuation are noted throughout
the deep and periventricular white matter of the cerebral
hemispheres bilaterally, compatible with chronic microvascular
ischemic disease. No evidence of acute infarction, hemorrhage,
hydrocephalus, extra-axial collection or mass lesion/mass effect.

Vascular: No hyperdense vessel or unexpected calcification.

Skull: Normal. Negative for fracture or focal lesion.

Sinuses/Orbits: No acute finding.

Other: None.
IMPRESSION: 1. No acute intracranial abnormalities.
2. Mild-to-moderate cerebral and cerebellar atrophy with extensive
chronic microvascular ischemic changes in the cerebral white matter,
as above.
